# Patient Record
Sex: Male | Born: 1970 | Race: White | Hispanic: No | Marital: Married | Smoking: Never smoker
Health system: Southern US, Community
[De-identification: ages and names within clinical notes are randomized; demographics above are authoritative.]

## PROBLEM LIST (undated history)

## (undated) DIAGNOSIS — E119 Type 2 diabetes mellitus without complications: Secondary | ICD-10-CM

## (undated) DIAGNOSIS — R519 Headache, unspecified: Secondary | ICD-10-CM

## (undated) DIAGNOSIS — N200 Calculus of kidney: Secondary | ICD-10-CM

## (undated) DIAGNOSIS — N189 Chronic kidney disease, unspecified: Secondary | ICD-10-CM

## (undated) DIAGNOSIS — I10 Essential (primary) hypertension: Secondary | ICD-10-CM

## (undated) DIAGNOSIS — I2510 Atherosclerotic heart disease of native coronary artery without angina pectoris: Secondary | ICD-10-CM

## (undated) DIAGNOSIS — E291 Testicular hypofunction: Secondary | ICD-10-CM

## (undated) DIAGNOSIS — M1990 Unspecified osteoarthritis, unspecified site: Secondary | ICD-10-CM

## (undated) DIAGNOSIS — E785 Hyperlipidemia, unspecified: Secondary | ICD-10-CM

## (undated) DIAGNOSIS — K219 Gastro-esophageal reflux disease without esophagitis: Secondary | ICD-10-CM

## (undated) DIAGNOSIS — G8929 Other chronic pain: Secondary | ICD-10-CM

## (undated) HISTORY — DX: Atherosclerotic heart disease of native coronary artery without angina pectoris: I25.10

## (undated) HISTORY — PX: KNEE ARTHROSCOPY: SUR90

## (undated) HISTORY — PX: SPINAL FUSION: SHX223

## (undated) HISTORY — DX: Calculus of kidney: N20.0

## (undated) HISTORY — PX: THYROIDECTOMY, PARTIAL: SHX18

## (undated) HISTORY — PX: SPINAL CORD STIMULATOR BATTERY EXCHANGE: SHX6202

## (undated) HISTORY — PX: PARATHYROIDECTOMY: SHX19

## (undated) HISTORY — PX: SHOULDER SURGERY: SHX246

## (undated) HISTORY — PX: TONSILLECTOMY: SUR1361

---

## 1999-06-14 ENCOUNTER — Ambulatory Visit (HOSPITAL_COMMUNITY): Admission: RE | Admit: 1999-06-14 | Discharge: 1999-06-14 | Payer: Self-pay | Admitting: Orthopedic Surgery

## 1999-06-14 ENCOUNTER — Encounter: Payer: Self-pay | Admitting: Orthopedic Surgery

## 2000-02-29 ENCOUNTER — Ambulatory Visit (HOSPITAL_COMMUNITY): Admission: RE | Admit: 2000-02-29 | Discharge: 2000-02-29 | Payer: Self-pay | Admitting: Gastroenterology

## 2001-10-31 ENCOUNTER — Encounter: Payer: Self-pay | Admitting: Orthopedic Surgery

## 2001-10-31 ENCOUNTER — Ambulatory Visit (HOSPITAL_COMMUNITY): Admission: RE | Admit: 2001-10-31 | Discharge: 2001-10-31 | Payer: Self-pay | Admitting: Orthopedic Surgery

## 2001-12-17 ENCOUNTER — Ambulatory Visit (HOSPITAL_COMMUNITY): Admission: RE | Admit: 2001-12-17 | Discharge: 2001-12-17 | Payer: Self-pay | Admitting: Neurosurgery

## 2001-12-17 ENCOUNTER — Encounter: Payer: Self-pay | Admitting: Neurosurgery

## 2002-02-12 ENCOUNTER — Encounter: Payer: Self-pay | Admitting: Neurosurgery

## 2002-02-12 ENCOUNTER — Ambulatory Visit (HOSPITAL_COMMUNITY): Admission: RE | Admit: 2002-02-12 | Discharge: 2002-02-12 | Payer: Self-pay | Admitting: Neurosurgery

## 2002-03-17 ENCOUNTER — Encounter: Payer: Self-pay | Admitting: Neurosurgery

## 2002-03-17 ENCOUNTER — Encounter: Admission: RE | Admit: 2002-03-17 | Discharge: 2002-03-17 | Payer: Self-pay | Admitting: Neurosurgery

## 2002-03-31 ENCOUNTER — Encounter: Payer: Self-pay | Admitting: Neurosurgery

## 2002-03-31 ENCOUNTER — Encounter: Admission: RE | Admit: 2002-03-31 | Discharge: 2002-03-31 | Payer: Self-pay | Admitting: Neurosurgery

## 2002-04-14 ENCOUNTER — Encounter: Admission: RE | Admit: 2002-04-14 | Discharge: 2002-04-14 | Payer: Self-pay | Admitting: Neurosurgery

## 2002-04-14 ENCOUNTER — Encounter: Payer: Self-pay | Admitting: Neurosurgery

## 2002-07-27 ENCOUNTER — Encounter: Payer: Self-pay | Admitting: Neurosurgery

## 2002-07-27 ENCOUNTER — Inpatient Hospital Stay (HOSPITAL_COMMUNITY): Admission: RE | Admit: 2002-07-27 | Discharge: 2002-07-28 | Payer: Self-pay | Admitting: Neurosurgery

## 2002-08-27 ENCOUNTER — Encounter: Admission: RE | Admit: 2002-08-27 | Discharge: 2002-08-27 | Payer: Self-pay | Admitting: Neurosurgery

## 2002-08-27 ENCOUNTER — Encounter: Payer: Self-pay | Admitting: Neurosurgery

## 2004-07-04 ENCOUNTER — Encounter: Admission: RE | Admit: 2004-07-04 | Discharge: 2004-07-04 | Payer: Self-pay | Admitting: Neurosurgery

## 2004-07-21 ENCOUNTER — Encounter: Admission: RE | Admit: 2004-07-21 | Discharge: 2004-07-21 | Payer: Self-pay | Admitting: Neurosurgery

## 2004-08-01 ENCOUNTER — Encounter: Admission: RE | Admit: 2004-08-01 | Discharge: 2004-08-01 | Payer: Self-pay | Admitting: Neurosurgery

## 2004-11-27 ENCOUNTER — Inpatient Hospital Stay (HOSPITAL_COMMUNITY): Admission: RE | Admit: 2004-11-27 | Discharge: 2004-11-28 | Payer: Self-pay | Admitting: Neurosurgery

## 2004-12-28 ENCOUNTER — Encounter: Admission: RE | Admit: 2004-12-28 | Discharge: 2004-12-28 | Payer: Self-pay | Admitting: Neurosurgery

## 2005-03-09 ENCOUNTER — Encounter: Admission: RE | Admit: 2005-03-09 | Discharge: 2005-03-09 | Payer: Self-pay | Admitting: Neurosurgery

## 2005-03-20 ENCOUNTER — Ambulatory Visit: Payer: Self-pay | Admitting: General Practice

## 2005-07-19 ENCOUNTER — Ambulatory Visit: Payer: Self-pay | Admitting: Orthopedic Surgery

## 2006-06-25 ENCOUNTER — Ambulatory Visit: Payer: Self-pay | Admitting: Pain Medicine

## 2006-07-03 ENCOUNTER — Ambulatory Visit: Payer: Self-pay | Admitting: Pain Medicine

## 2006-08-14 ENCOUNTER — Ambulatory Visit: Payer: Self-pay | Admitting: Pain Medicine

## 2006-09-04 ENCOUNTER — Ambulatory Visit: Payer: Self-pay | Admitting: Pain Medicine

## 2006-09-17 ENCOUNTER — Ambulatory Visit: Payer: Self-pay | Admitting: Pain Medicine

## 2006-09-23 ENCOUNTER — Ambulatory Visit: Payer: Self-pay | Admitting: Pain Medicine

## 2006-10-22 HISTORY — PX: SPINAL CORD STIMULATOR IMPLANT: SHX2422

## 2006-10-23 ENCOUNTER — Ambulatory Visit: Payer: Self-pay | Admitting: Physician Assistant

## 2006-10-24 ENCOUNTER — Ambulatory Visit: Payer: Self-pay | Admitting: Pain Medicine

## 2006-11-08 ENCOUNTER — Ambulatory Visit: Payer: Self-pay | Admitting: Physician Assistant

## 2006-11-12 ENCOUNTER — Ambulatory Visit: Payer: Self-pay | Admitting: Pain Medicine

## 2006-11-21 ENCOUNTER — Ambulatory Visit: Payer: Self-pay | Admitting: Physician Assistant

## 2006-12-12 ENCOUNTER — Ambulatory Visit: Payer: Self-pay | Admitting: Pain Medicine

## 2006-12-30 ENCOUNTER — Ambulatory Visit: Payer: Self-pay | Admitting: Physician Assistant

## 2007-01-09 ENCOUNTER — Ambulatory Visit: Payer: Self-pay | Admitting: Pain Medicine

## 2007-01-29 ENCOUNTER — Ambulatory Visit: Payer: Self-pay | Admitting: Physician Assistant

## 2007-02-27 ENCOUNTER — Ambulatory Visit: Payer: Self-pay | Admitting: Physician Assistant

## 2007-03-26 ENCOUNTER — Ambulatory Visit: Payer: Self-pay | Admitting: Physician Assistant

## 2007-04-29 ENCOUNTER — Ambulatory Visit: Payer: Self-pay | Admitting: Physician Assistant

## 2007-05-01 ENCOUNTER — Ambulatory Visit (HOSPITAL_COMMUNITY): Admission: RE | Admit: 2007-05-01 | Discharge: 2007-05-01 | Payer: Self-pay | Admitting: Neurology

## 2007-05-13 ENCOUNTER — Ambulatory Visit: Payer: Self-pay | Admitting: Physician Assistant

## 2007-05-28 ENCOUNTER — Ambulatory Visit: Payer: Self-pay | Admitting: Physician Assistant

## 2007-06-30 ENCOUNTER — Ambulatory Visit: Payer: Self-pay | Admitting: Physician Assistant

## 2007-07-08 ENCOUNTER — Ambulatory Visit: Payer: Self-pay | Admitting: Pain Medicine

## 2007-08-07 ENCOUNTER — Ambulatory Visit: Payer: Self-pay | Admitting: Physician Assistant

## 2007-08-12 ENCOUNTER — Ambulatory Visit: Payer: Self-pay | Admitting: Pain Medicine

## 2007-09-03 ENCOUNTER — Ambulatory Visit: Payer: Self-pay | Admitting: Physician Assistant

## 2007-09-22 ENCOUNTER — Ambulatory Visit: Payer: Self-pay | Admitting: Pain Medicine

## 2007-09-23 ENCOUNTER — Ambulatory Visit: Payer: Self-pay | Admitting: Pain Medicine

## 2007-10-07 ENCOUNTER — Ambulatory Visit: Payer: Self-pay | Admitting: Physician Assistant

## 2007-11-06 ENCOUNTER — Ambulatory Visit: Payer: Self-pay | Admitting: Pain Medicine

## 2007-12-10 ENCOUNTER — Ambulatory Visit: Payer: Self-pay | Admitting: Physician Assistant

## 2007-12-31 ENCOUNTER — Ambulatory Visit: Payer: Self-pay | Admitting: Physician Assistant

## 2008-01-08 ENCOUNTER — Encounter: Payer: Self-pay | Admitting: Pain Medicine

## 2008-01-23 ENCOUNTER — Encounter: Payer: Self-pay | Admitting: Pain Medicine

## 2008-01-27 ENCOUNTER — Ambulatory Visit: Payer: Self-pay | Admitting: Physician Assistant

## 2008-03-23 ENCOUNTER — Ambulatory Visit: Payer: Self-pay | Admitting: Physician Assistant

## 2008-06-22 ENCOUNTER — Ambulatory Visit: Payer: Self-pay | Admitting: Physician Assistant

## 2008-07-06 ENCOUNTER — Ambulatory Visit: Payer: Self-pay | Admitting: Physician Assistant

## 2008-09-06 ENCOUNTER — Ambulatory Visit: Payer: Self-pay | Admitting: Physician Assistant

## 2008-09-28 ENCOUNTER — Ambulatory Visit: Payer: Self-pay | Admitting: Physician Assistant

## 2008-11-02 ENCOUNTER — Ambulatory Visit: Payer: Self-pay | Admitting: Physician Assistant

## 2008-12-28 ENCOUNTER — Ambulatory Visit: Payer: Self-pay | Admitting: Physician Assistant

## 2009-01-11 ENCOUNTER — Ambulatory Visit: Payer: Self-pay | Admitting: Pain Medicine

## 2009-02-01 ENCOUNTER — Ambulatory Visit: Payer: Self-pay | Admitting: Physician Assistant

## 2009-02-08 ENCOUNTER — Ambulatory Visit: Payer: Self-pay | Admitting: Pain Medicine

## 2009-02-24 ENCOUNTER — Ambulatory Visit: Payer: Self-pay | Admitting: Physician Assistant

## 2009-03-24 ENCOUNTER — Ambulatory Visit: Payer: Self-pay | Admitting: Physician Assistant

## 2009-06-30 ENCOUNTER — Ambulatory Visit: Payer: Self-pay | Admitting: Physician Assistant

## 2009-09-07 ENCOUNTER — Encounter: Admission: RE | Admit: 2009-09-07 | Discharge: 2009-09-07 | Payer: Self-pay | Admitting: Neurosurgery

## 2009-09-16 ENCOUNTER — Encounter: Admission: RE | Admit: 2009-09-16 | Discharge: 2009-09-16 | Payer: Self-pay | Admitting: Neurosurgery

## 2009-09-27 ENCOUNTER — Ambulatory Visit: Payer: Self-pay | Admitting: Physician Assistant

## 2009-12-19 ENCOUNTER — Ambulatory Visit: Payer: Self-pay | Admitting: Pain Medicine

## 2010-03-30 ENCOUNTER — Ambulatory Visit: Payer: Self-pay | Admitting: Pain Medicine

## 2011-03-09 NOTE — Op Note (Signed)
[name] [name]              ACCOUNT NO.:  [id number]   MEDICAL RECORD NO.:  [id number]          PATIENT TYPE:  INP   LOCATION:  2899                         FACILITY:  [facility]   PHYSICIAN:  Kathaleen Maser. Pool, M.D.    DATE OF BIRTH:  07/08/1971   DATE OF PROCEDURE:  11/27/2004  DATE OF DISCHARGE:                                 OPERATIVE REPORT   PREOPERATIVE DIAGNOSIS:  L5-S1 degenerative disk disease/failed back  syndrome, with intractable back pain and radiculopathy.  Status posts L4-5  fusion with instrumentation.   POSTOPERATIVE DIAGNOSIS:  L5-S1 degenerative disk disease/failed back  syndrome, with intractable back pain and radiculopathy.  Status posts L4-5  fusion with instrumentation.   PROCEDURE:  1.  Re-exploration of L4-5 posterior lumbar fusion with removal of hardware.  2.  L5-S1 re-exploration of laminectomy with bilateral redo      microdiskectomies.  3.  L5-S1 posterior lumbar body fusion utilizing tangent wedges and local      autografting.  4.  L5-S1 posterior lateral arthrodesis utilizing nonsegmental pedicle screw      fixation and local autografting.   ASSISTANT:  Donalee Citrin, M.D.   ANESTHESIA:  General orotracheal.   INDICATIONS:  [name] [name] is a 40 year old male who is status post  previous L4-5 and L5-S1 laminectomies and diskectomies.  The patient  subsequently underwent an L4-5 decompression and fusion with reasonably good  results.  The patient has been bothered with progressively worsening back  and left lower extremity pain, failed all conservative management, however.  Workup has demonstrated evidence of progressive breakdown of the L5-S1 disk  space.  The patient has marked reproduction of his symptoms with prerogative  diskotomy at the L5-S1 level.  The remainder of his lumbar spine looks well  healed.  The previous lumbar fusion appears solid at L4-5.  We have  discussed the options of management, including possibility of re-exploring  his  fusion and then performing an L5-S1 decompression and fusion in hopes of  improving his symptoms,  The patient is aware of the risks and benefits and  wishes to proceed.   OPERATIVE NOTE:  The patient was placed on the operating room table in the  supine position.  After adequate level of anesthesia was achieved, the  patient was positioned prone onto a Wilson frame and appropriately padded.  The patient's lumbar region was prepped and draped sterilely.  A 10 blade  was used to make a  linear skin incision overlying the L3-4-5 and S1 levels.  This was carried down sharply in the midline.  Subperiosteal dissection was  then performed, exposing the lamina and facet joints of L3-4, the pedicle  screw fixation of L4-5 and the transverse process of L5, the sacral alae,  and the L5-S1 facet joint complex, as well as the lamina of the sacrum.  The  L5 lamina was also dissected free.  The patient's previous laminotomy site  on the right at L5-S1 was dissected free.  Complete laminectomy at L5 was  then performed, using Leksell rongeurs, Kerrison rongeurs and high-speed  drill to remove the entire lamina at  L5.  Complete inferior facetectomies at  L5 were then performed bilaterally.  Complete superior facetectomies at S1  were performed bilaterally.  Epidural scar, ligamentum flavum was then  elevated, resected in usual fashion, using Kerrison rongeurs.  Underlying  thecal sac and exiting L5 and S1 nerve roots were identified.  Wide  decompressive foraminotomies were then performed along the course of the  exiting L5 and S1 nerve roots.  Epidural venous plexus was coagulated and  cut.  Epidural scar was dissected free on the patient's right side.  Starting first with the patient's right side, thecal sac and nerve roots  were protected and retracted towards the midline.  Disk space at L5-S1 was  then identified, incised with a 15 blade in a rectangular fashion, which  assured wide disk space.   Clean-out was then achieved using pituitary  rongeurs, upward-angled pituitary rongeurs, and Epstein curettes.  The  procedure was then repeated on the contralateral side, again without  complication.  Disk space was then sequentially dilated up to 8 mm, then an  8-mm distractor left on patient's right side.  Thecal sac and nerve roots  were protected on the left side.  Disk space was then reamed with 8-mm  tangent box cutter and then cut with an 8-mm tangent incision.  Soft tissue  was removed from the interspace.  An 8 x 26-mm tangent wedge was the  impacted into place and recessed approximately 2 mm from the posterior  cortical margin.  The distractor was removed from the patient's right side.  Thecal sac and nerve roots were protected on the right side.  Disk space was  once again reamed and then cut with 8-mm tangent instruments.  The disk  space was further curettaged.  Morcellized autograft was then packed in the  interspace.  A second 8 x 26-mm tangent wedge was then impacted into place.  Pedicle screws at L5 were left in place.  The pedicle screws at L4 were  disconnected, as were the pedicle screws at L5.  The rod was removed.  The  pedicle screws at L4 were removed.  The fusion at L4-5 was explored and  found to be rock solid.  The pedicle screws at L5 were left in position to  use in the L5-S1 arthrodesis.  Pedicles at S1 were then identified  bilaterally.  Pilot holes were then drilled.  This was done under  fluoroscopic guidance.  Each pedicle was then probed using pedicle awl.  Each pedicle awl tract was then probed and found to be solidly within bone.  Each awl tract was then tapped with 5.25-mm screw tap.  Screw tap hole was  probed and found to be solid within bone.  A 6.5 x 35-mm spiral 90-degree  screws were placed bilaterally at S1.  Transverse process and sacral alae were then decorticated, using a high-speed drill.  Morcellized autograft was  packed posterolaterally.   A short segment of titanium rod was then contoured  and placed through the screw heads at L5 and S1.  A locking cap was then  placed over the screw heads.  Locking caps were then engaged with construct  under compression.  Final images revealed good position of the bone graft  and hardware, proper operative level with normal alignment of spine.  Gelfoam was placed topically over the thecal sac for hemostasis.  A medium  Hemovac drain was left in the epidural space.  The wound was closed in  layers with Vicryl sutures.  Steri-Strips and sterile dressing were applied.  There were no intraoperative complications.  The patient tolerated the  procedure.  He was taken to the recovery room postoperatively.      HAP/MEDQ  D:  11/27/2004  T:  11/27/2004  Job:  [id number]

## 2011-03-09 NOTE — Op Note (Signed)
NAME:  [name], [name]                        ACCOUNT NO.:  [id number]   MEDICAL RECORD NO.:  [id number]                   PATIENT TYPE:  INP   LOCATION:  3021                                 FACILITY:  [facility]   PHYSICIAN:  Kathaleen Maser. Pool, M.D.                 DATE OF BIRTH:  [birth year]   DATE OF PROCEDURE:  07/27/2002  DATE OF DISCHARGE:                                 OPERATIVE REPORT   PREOPERATIVE DIAGNOSES:  L4-5 grade I spondylolisthesis, left L4-5 recurrent  herniated nucleus pulposis, and failed back syndrome.   POSTOPERATIVE DIAGNOSES:  L4-5 grade I spondylolisthesis, left L4-5  recurrent herniated nucleus pulposis, and failed back syndrome.   PROCEDURES:  L4-5 decompressive laminectomy with foraminotomies, redo;  bilateral L4-5 redo microdiskectomies.  L4-5 posterior lumbar interbody  fusion utilizing Tangent wedges and local autograft.  L4-5 posterolateral  fusion utilizing pedicle screws in addition to local autograft.   SURGEON:  Kathaleen Maser. Pool, M.D.   ASSISTANT:  Donzetta Sprung. Wynetta Emery, M.D.   ANESTHESIA:  General endotracheal.   INDICATIONS:  The patient is a 40 year old male, who is status post previous  left-sided L4-5 and right-sided L5-S1 laminotomy and microdiskectomy.  Postoperatively, the patient has had difficulty with persistent back and  left lower extremity pain failing all conservative  management.  MRI  scanning demonstrated evidence of a small recurrent disk herniation out to  the left side at L4-5.  The patient also has evidence of disk space collapse  and an early grade I spondylolisthesis at L4-5.  The patient has failed  conservative  management.  He has decided to proceed with an L4-5  decompression and fusion procedure with instrumentation in hopes of  improving his symptoms.   OPERATIVE NOTE:  The patient was taken to the operating room and placed on  the table in a supine position.  After an adequate level of anesthesia was  achieved, the patient  was positioned prone on to a Wilson frame and  appropriately padded.  The patient's lumbar region was prepped and draped  with sterile drapes.  A 10-blade was used to make a linear skin incision  overlying the L3, L4, and L5 levels.  This was carried down sharply in the  midline.  Subperiosteal dissection was then performed exposing the lamina  and facet joints of L3, L4, and L5 as well as the transverse processes of L4  and L5.  Deep self-retaining retractor was placed.  Intraoperative x-ray was  taken and the level was confirmed.  A decompressive laminectomy was then  performed at L4-5 using Kerrison rongeurs, Leksell rongeurs, and the high-  speed drill.  While dissecting, it was noted that the lamina and inferior  facet complex at L4 was completely loose bilaterally.  Dissecting this free  further demonstrated completely spondylosis bilaterally at L4.  The lamina  and inferior facets were resected.  All bone was cleaned and used  in later  autografting.  Partial superior facetectomy was then performed using  Kerrison rongeurs of L5 and the superior aspect of the L5 lamina was also  removed.  Epidural scar from previous laminotomy was dissected free using  __________.  The underlying thecal sac and exiting L5 and L5 nerve roots  were identified.  A wide foraminotomy was performed along the course of the  exiting nerve roots.  Epidural venous plexus was coagulated and cut.  Epidural scar was further lysed on the left-sided L4-5 and the thecal sac at  L5 nerve root were mobilized and retracted towards the midline.  With  microscope for microdissection, the disk space was identified and disk  herniation was identified.  These were incised with a 15-blade in a standard  fashion.  A wide disk space clean out was then achieved using pituitary  rongeurs, upward angled pituitary rongeurs and Epstein curets.  The  procedure was then repeated on the contralateral side.  The disk space was  then  distracted up to 10 mm with the distractor left in the patient's left  side.  Attention was placed to the right side.  The disk space was then cut  with a box cutter and then cut with a 10 mm Tangent chisel.  All soft tissue  was removed from the interspace.  A 10 x 26 mm Tangent wedge was then  impacted into place, recessed approximately 2 mm within the posterior  cortical margin.  The retractor system was removed.  The distractor was  removed from the other side, thecal sac and nerve roots were protected on  the left.  Disk space was then reamed and then cut with a 10 mm Tangent  chisel.  Soft tissue was removed.  The disk space was further curettage and  morcellized autograft was packed in the interspace.  A second 10 x 26 mm  Tangent wedge was then impacted into place and recessed approximately 1 mm  from the posterior cortical margin.  Intraoperative x-rays revealed good  position of the bone grafts at the proper level.  The pedicles at L4 and L5  were then isolated using surface landmarks and intraoperative fluoroscopy.  ___________ bone was removed overlying the pedicle using the high-speed  drill.  Each pedicle was then probed using a pedicle awl.  Each pedicle awl  tract was found to be solid within bone.  Each pedicle awl tract was then  tapped with a 5.2 mm screw tap.  Each screw tap hole was found to be solid  within bone.  At L4, a 6.75 x 45 mm spiral 90 screws were placed  bilaterally; at L5 6.75 x 40 mm spiral 90 screws were placed bilaterally.  The transverse processes of L4 and L5 were then decorticated using the high-  speed drill.  Morcellized autograft was packed posterolaterally for fusion.  A short segment of titanium rod was then contoured and placed over the screw  heads bilaterally.  Locking caps were then placed over the screw heads.  The  locking caps were then engaged with the construct under compression.  Final images revealed good position of bone grafts and  hardware at the proper  operative level with normal alignment of the spine.  Blunt probe passed  easily along the course of the exiting nerve roots.  There was no evidence  of injury to thecal sac or nerve roots.  There was no evidence of any  residual compression of the thecal sac or nerve roots.  Final images  revealed good position of the bone grafts and hardware at the proper  operative level and normal alignment of the spine in both A/P and lateral  planes.  The wound was irrigated with antibiotic solution.  Gelfoam was  placed topically for hemostasis, which was found to be good.  A medium  Hemovac drain was left in the epidural space.  The wound was then closed in  layers with Vicryl sutures.  Steri-Strips and sterile dressings were  applied.  There were no apparent complications.  The patient tolerated the  procedure well and he returned to the recovery room postoperatively.                                               Henry A. Pool, M.D.    HAP/MEDQ  D:  07/27/2002  T:  07/27/2002  Job:  [id number]

## 2011-03-09 NOTE — Op Note (Signed)
[place]  Patient:    [name] [name], [name] [name] Visit Number: [id number] MRN: [id number]          Service Type: DSU Location: 3000 [id number] Attending Physician:  Donn Pierini Dictated by:   Julio Sicks, M.D. Proc. Date: 12/17/01 Admit Date:  12/17/2001                             Operative Report  PREOPERATIVE DIAGNOSIS:  Left L4-5 herniated nucleus pulposus with radiculopathy and right L5-S1 herniated nucleus pulposus with radiculopathy.  POSTOPERATIVE DIAGNOSIS:  Left L4-5 herniated nucleus pulposus with radiculopathy and right L5-S1 herniated nucleus pulposus with radiculopathy.  OPERATION PERFORMED:  Left L4-5 laminotomy with microdiskectomy.  Right L4-5 laminotomy with microdiskectomy.  SURGEON:  Julio Sicks, M.D.  ASSISTANT:  Donalee Citrin, Montez Hageman.  ANESTHESIA:  General endotracheal.  INDICATIONS FOR PROCEDURE:  The patient is a 40 year old male with history of bilateral lower extremity pain and some degree of back pain failing all conservative management.  MRI scanning demonstrates evidence of a left-sided paracentral disk herniation at L4-5 with compression of the thecal sac and left-sided L5 nerve root.  There is also evidence of a right-sided L5-S1 disk herniation with compression of the right-sided S1 nerve root.  We discussed the options available for management.  The patient appears to be symptomatic from both problems.  We decided to proceed with a left-sided L4-5 and a right-sided L5-S1 laminotomy and microdiskectomy for hopeful relief of his symptoms.  DESCRIPTION OF PROCEDURE:  The patient was taken to the operating room and placed on the operating table in supine position.  After an adequate level of anesthesia was achieved, the patient was positioned prone onto a Wilson frame and appropriately padded.  The patients lumbar region was shaved and prepped sterilely.  A 10 blade was used to make a linear skin incision overlying  the L4-5 and S1 levels.  This was carried down sharply in the midline. Subperiosteal dissection was performed exposing the lamina and facet joints at L4-5 on the left side and L5-S1 on the right side.  The self-retaining retractor was placed at L4-5.  X-ray was taken and the level was confirmed.  A laminotomy was then performed using a high speed drill and Kerrison rongeurs on the left side at L4-5 to remove the inferior one third of the lamina at L4, the medial edge of the L4-5 facet joint and the superior rim of the L5 lamina. The ligamentum flavum was then elevated and resected in piecemeal fashion using Kerrison rongeurs for the underlying thecal sac and exiting left-sided L5 nerve root were identified.  Gelfoam was placed over the laminotomy defect and attention was placed to the right side at L5-S1.  Once again the high speed drill and Kerrison rongeurs were used to remove the inferior one third of the lamina of L5, the medial edge of the L5-S1 facet joint and the superior rim of the S1 lamina.  The ligamentum flavum was elevated and resected in piecemeal fashion using Kerrison rongeurs.  The underlying thecal sac was then identified as was the exiting right-sided S1 nerve root.  The epidural venous plexus was coagulated and cut.  Microscope was brought into the field and used for microdissection of the nerve roots and underlying disk herniation. Starting first on the right side at L5-S1, the thecal sac and nerve roots were mobilized and retracted toward the midline.  Disk space was isolated  and incised with a 15 blade in rectangular fashion.  A wide disk space clean-out was then achieved using pituitary rongeurs, upward angled pituitary rongeurs and Epstein curets.  Overhanging osteophytes were removed using an osteophyte remover.  All loose or obviously degenerative disk material was removed from the interspace.  All elements of disk herniation were completely resected.  At this  point there was no evidence of compression of the thecal sac or nerve root of S1 on the right side.  Attention was then placed to the left side at L4-5.  Once again the thecal sac and nerve roots were protected.  Disk herniation was readily apparent.  A free fragment was encountered and resected completely.  The disk space was then incised with a 15 blade in rectangular fashion.  A wide disk space clean out was then achieved using pituitary rongeurs, upward angled pituitary rongeurs and Epstein curets.  All loose or obviously degenerated disk material was removed from the interspace.  After a very thorough diskectomy had been performed at both levels.  The wound was then irrigated with antibiotic solution.  Gelfoam was placed topically over each laminotomy defect where hemostasis was found to be good.  Microscope and retractor were removed.  Hemostasis in the muscle achieved with electrocautery.  The wound was then closed in layers with Vicryl sutures. Steri-Strips and sterile dressing were applied.  There were no apparent complications.   The patient tolerated the procedure well and he returned to the recovery room postoperatively. y Dictated by:   Julio Sicks, M.D. Attending Physician:  Donn Pierini DD:  12/17/01 TD:  12/17/01 Job: [id number]

## 2011-03-09 NOTE — Op Note (Signed)
NAME:  [name] [name], [name] [name]                        ACCOUNT NO.:  [id number]   MEDICAL RECORD NO.:  [id number]                   PATIENT TYPE:  INP   LOCATION:  3021                                 FACILITY:  [facility]   PHYSICIAN:  Kathaleen Maser. Pool, M.D.                 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   PROCEDURES:  1. L4-5 decompressive laminectomy with foraminotomies.  2. L4-5 posterior lumbar interbody fusion utilizing Tangent wedges and local     autograft.  3. L4-5 posterolateral fusion utilizing pedicle screw fixation and local     autograft.   SURGEON:  Kathaleen Maser. Pool, M.D.   ASSISTANT:  Donalee Citrin, M.D.   ANESTHESIA:  General endotracheal.   INDICATIONS:  The patient is a 40 year old male who is status post previous  L4-5 and L5-S1 laminotomy and diskectomies.  Postoperatively the patient has  had a great deal of back pain, which has progressively worsened.  He has  recurrent left lower extremity radicular symptoms consistent with both left-  sided L4 and L5 radiculopathies.  The patient has failed all efforts at  conservative management.  Workup has demonstrated evidence of a small  recurrent disk herniation off to the left at L4-5 and what appears to be an  early grade 1 spondylolisthesis.  We discussed options available for  management, including the possibility of undergoing an L4-5 decompression  and fusion surgery in hopes of alleviating his symptoms.   DESCRIPTION OF PROCEDURE:  The patient was taken to the operating room and  placed on the operating table in the supine position.  After an adequate  level of anesthesia was achieved, the patient was positioned prone onto a  Wilson frame, appropriately padded.  The patient's lumbar region was prepped  and draped sterilely.  A 10 blade was used to make a linear skin incision  overlying the L4-5 interspace.  This was carried down sharply in the  midline.  Subperiosteal dissection was performed, exposing the laminae and  facet joints at L3, L4, and L5.  The dissection was taken out further  laterally, and the transverse processes at L4 and L5 were dissected free.  A  deep self-retaining retractor was placed.  Intraoperative x-ray was taken  with the fluoroscopy unit, and the level was confirmed.  It should be noted  at this point that it was readily apparent the patient had suffered from  traumatic spondylolysis of the L4 pars.  This was not present at his  previous surgery.  A complete laminectomy at L4 was then performed using  Leksell  rongeurs, Kerrison rongeurs, and the high-speed drill.  This was a  re-exploration of his previous laminotomy site.  The ligamentum flavum and  epidural scar were elevated and resected in piecemeal fashion using Kerrison  rongeur.  The underlying thecal sac and exiting L4 and L5 nerve roots were  identified and widely decompressed.  Epidural scar was further resected.  Nerve roots and thecal sac on the left side were mobilized and retracted  toward the midline.  The disk herniation was readily apparent.  This was  incised with a 15 blade in rectangular fashion.  A wide disk space clean-out  was then achieved using pituitary rongeurs, upward-angled pituitary  rongeurs, and Epstein curettes.  The procedure was then repeated on the  contralateral side.  The disk space was then distracted up to 10 mm.  With  10 mm distractor left in the patient's right side, thecal sac and nerve  roots were protected on the left.  The disk space was then reamed and then  cut with a 10 mm chisel on the left.  Soft tissue was  removed from the  interspace.  A 10 x 26 mm Tangent wedge was then impacted into place and  recessed approximately 1 mm from the posterior cortical margin.  Retractor  and the distractor was removed.  Thecal sac and nerve roots were retracted  on the right.  Disk space was then once again cut and reamed and then later  cut with a 10 mm chisel.  Soft tissue was removed.  The disk space was  further curetted.  Morcellized autograft was saved from the previous  laminectomy and facetectomy and was then packed in the interspace.  A second  10 x 26 wedge was then impacted into placed and recessed approximately 1 mm  from the posterior cortical margin.  The pedicles at L4 and L5 were then  isolated using ______ intraoperative fluoroscopy.  Isolating the pedicles,  each pedicle was then entered by first drilling a pilot hole under  fluoroscopic guidance, tapping the pilot hole using a pedicle awl, testing  the pedicle awl hole using a blunt probe, tapping the pedicle awl hole using  5.25 mm screw tap, and then subsequently placing the screw.  Spiral 90 6.75  mm screws were placed bilaterally at L4 and L5.  All screws were found to be  well-positioned.  Transverse processes were decorticated using the high-  speed drill.  Morcellized autograft was packed posterolaterally.  A short  segment of titanium rod was placed over the screw heads at L4 and L5.  Locking caps were then engaged.  Caps were then placed under compression and  given a final tightening.  Final images revealed good position of bone  grafts and hardware, proper operative level, with normal alignment of the  spine.  The wound was irrigated one final time.  Gelfoam was placed  topically for hemostasis and found to be good.  A medium Hemovac drain was  left in the epidural space.  The wound was then closed in layers with Vicryl  sutures.  Steri-Strips and a sterile dressing were applied.  There were no apparent complications.  The  patient tolerated the procedure well, and he  returns to the recovery room postop.  Henry A. Pool, M.D.    HAP/MEDQ  D:  08/13/2002  T:  08/14/2002  Job:  [id number]

## 2011-03-09 NOTE — Procedures (Signed)
[place]  Patient:    [name] [name], [name] [name]                     MRN: [id number] Proc. Date: 02/29/00 Adm. Date:  [id number] Disc. Date: [id number] Attending:  Deneen Harts CC:         [facility], [place]                           Procedure Report  PROCEDURE:  Panendoscopy.  INDICATION:  A 40 year old white male with epigastric, substernal, and interscapular pain.  No correlation with food.  Patient does note choking sensation.  He had been taking doxycycline for six weeks, and this was felt to possibly be contributing to his symptoms.  He has now been off doxycycline for the past several weeks without improvement.  Also, a trial of Aciphex 20 mg daily without improvement over his usual Prevacid 30 mg per day.  Also complaining of left lower quadrant pain, intermittent, cramping in nature, associated with increased stool frequency.  Undergoing endoscopy to further evaluate persistent abdominal pain.  DESCRIPTION OF PROCEDURE:  After reviewing the nature of the procedure with the patient including potential risks and complications, and after discussing alternative methods of diagnosis and treatment, informed consent was signed.  The patient was premedicated, receiving IV sedation totalling Versed 7 mg, fentanyl 75 mcg, administered in divided doses prior to and during the course of the procedure.  Using an Olympus video endoscope, proximal esophagus intubated under direct vision.  Normal oral findings without lesion of the epiglottis, vocal cords, or piriform sinus.  Proximal, mid-, and distal segments of the esophagus normal.  Mucosal Z-line distinct at 42 cm.  No significant hiatal hernia identified.  No evidence of reflux disease.  Gastric fundus, body, and antrum normal.  Pylorus symmetric.  Duodenal bulb and second portion normal.  Retroflex view of the angularis, lesser curve, gastric cardia and fundus negative.  Stomach  decompressed, scope withdrawn.  The patient tolerated the procedure without difficulty, being maintained on Datascope monitor, local oxygen throughout.  Returned to recovery in stable condition.  ASSESSMENT: 1. Abdominal pain, probably functional. 2. Probable irritable bowel syndrome.  Discussed with mother, who feels that    symptoms may all be stress-related.  RECOMMENDATIONS: 1. Trial of Nexium 40 mg daily for 30 days. 2. Antireflux measures. 3. Trial of NuLev one to two SL q.4h. p.r.n. left lower quadrant abdominal    pain. 4. ROV p.r.n. DD:  02/29/00 TD:  03/04/00 Job: [id number]

## 2011-09-27 DIAGNOSIS — R7989 Other specified abnormal findings of blood chemistry: Secondary | ICD-10-CM | POA: Insufficient documentation

## 2011-09-27 DIAGNOSIS — E119 Type 2 diabetes mellitus without complications: Secondary | ICD-10-CM | POA: Insufficient documentation

## 2011-12-22 ENCOUNTER — Ambulatory Visit: Payer: Self-pay | Admitting: Internal Medicine

## 2012-10-30 ENCOUNTER — Ambulatory Visit: Payer: Self-pay | Admitting: Otolaryngology

## 2013-05-25 ENCOUNTER — Ambulatory Visit: Payer: Self-pay | Admitting: Physician Assistant

## 2013-05-25 LAB — URINALYSIS, COMPLETE
Bacteria: NEGATIVE
Bilirubin,UR: NEGATIVE
Blood: NEGATIVE
Glucose,UR: 100 mg/dL (ref 0–75)
Ketone: NEGATIVE
Leukocyte Esterase: NEGATIVE
Nitrite: NEGATIVE
Ph: 6.5 (ref 4.5–8.0)
Protein: NEGATIVE
Specific Gravity: 1.01 (ref 1.003–1.030)
Squamous Epithelial: NONE SEEN

## 2014-03-11 DIAGNOSIS — F329 Major depressive disorder, single episode, unspecified: Secondary | ICD-10-CM | POA: Insufficient documentation

## 2014-03-11 DIAGNOSIS — F32A Depression, unspecified: Secondary | ICD-10-CM | POA: Insufficient documentation

## 2014-03-11 DIAGNOSIS — B001 Herpesviral vesicular dermatitis: Secondary | ICD-10-CM | POA: Insufficient documentation

## 2014-03-15 ENCOUNTER — Emergency Department: Payer: Self-pay | Admitting: Emergency Medicine

## 2014-03-15 LAB — D-DIMER(ARMC): D-Dimer: 174 ng/ml

## 2014-03-15 LAB — CBC
HCT: 49.2 % (ref 40.0–52.0)
HGB: 16.6 g/dL (ref 13.0–18.0)
MCH: 32.7 pg (ref 26.0–34.0)
MCHC: 33.7 g/dL (ref 32.0–36.0)
MCV: 97 fL (ref 80–100)
Platelet: 219 10*3/uL (ref 150–440)
RBC: 5.06 10*6/uL (ref 4.40–5.90)
RDW: 13.1 % (ref 11.5–14.5)
WBC: 8.3 10*3/uL (ref 3.8–10.6)

## 2014-03-15 LAB — COMPREHENSIVE METABOLIC PANEL
Albumin: 3.7 g/dL (ref 3.4–5.0)
Alkaline Phosphatase: 62 U/L
Anion Gap: 7 (ref 7–16)
BUN: 10 mg/dL (ref 7–18)
Bilirubin,Total: 0.3 mg/dL (ref 0.2–1.0)
Calcium, Total: 8.8 mg/dL (ref 8.5–10.1)
Chloride: 102 mmol/L (ref 98–107)
Co2: 30 mmol/L (ref 21–32)
Creatinine: 1.17 mg/dL (ref 0.60–1.30)
EGFR (African American): >60
EGFR (Non-African Amer.): >60
Glucose: 78 mg/dL (ref 65–99)
Osmolality: 275 (ref 275–301)
Potassium: 3.8 mmol/L (ref 3.5–5.1)
SGOT(AST): 16 U/L (ref 15–37)
SGPT (ALT): 31 U/L (ref 12–78)
Sodium: 139 mmol/L (ref 136–145)
Total Protein: 7.1 g/dL (ref 6.4–8.2)

## 2014-03-15 LAB — PRO B NATRIURETIC PEPTIDE: B-Type Natriuretic Peptide: 15 pg/mL (ref 0–125)

## 2014-03-15 LAB — TROPONIN I: Troponin-I: <0.02 ng/mL

## 2014-04-15 ENCOUNTER — Other Ambulatory Visit (HOSPITAL_COMMUNITY): Payer: Self-pay | Admitting: Orthopedic Surgery

## 2014-04-15 DIAGNOSIS — T8489XA Other specified complication of internal orthopedic prosthetic devices, implants and grafts, initial encounter: Secondary | ICD-10-CM

## 2014-04-19 ENCOUNTER — Encounter (HOSPITAL_COMMUNITY): Payer: Self-pay

## 2014-04-19 ENCOUNTER — Other Ambulatory Visit (HOSPITAL_COMMUNITY): Payer: Self-pay

## 2014-04-27 ENCOUNTER — Encounter (HOSPITAL_COMMUNITY): Payer: Self-pay

## 2014-04-27 ENCOUNTER — Ambulatory Visit (HOSPITAL_COMMUNITY): Payer: Self-pay

## 2014-05-04 ENCOUNTER — Encounter (HOSPITAL_COMMUNITY)
Admission: RE | Admit: 2014-05-04 | Discharge: 2014-05-04 | Disposition: A | Discharge status: Home / Self Care | Payer: Medicare Other | Source: Ambulatory Visit | Attending: Orthopedic Surgery | Admitting: Orthopedic Surgery

## 2014-05-04 DIAGNOSIS — M25469 Effusion, unspecified knee: Secondary | ICD-10-CM | POA: Insufficient documentation

## 2014-05-04 DIAGNOSIS — T8489XA Other specified complication of internal orthopedic prosthetic devices, implants and grafts, initial encounter: Secondary | ICD-10-CM

## 2014-05-04 DIAGNOSIS — M25569 Pain in unspecified knee: Secondary | ICD-10-CM | POA: Insufficient documentation

## 2014-05-04 DIAGNOSIS — Z96659 Presence of unspecified artificial knee joint: Secondary | ICD-10-CM | POA: Diagnosis not present

## 2014-05-04 MED ORDER — TECHNETIUM TC 99M MEDRONATE IV KIT
25.0000 | PACK | Freq: Once | INTRAVENOUS | Status: AC | PRN
  Administered 2014-05-04: 25 via INTRAVENOUS

## 2014-05-07 DIAGNOSIS — G4700 Insomnia, unspecified: Secondary | ICD-10-CM | POA: Insufficient documentation

## 2015-02-11 NOTE — Op Note (Signed)
PATIENT NAME:  [name] Brown, [name] Brown MR#:  [id number] DATE OF BIRTH:  [birth year]  DATE OF PROCEDURE:  10/30/2012  PREOPERATIVE DIAGNOSIS:  1. 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  2. 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  POSTOPERATIVE DIAGNOSIS:  1. 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  2. 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   PROCEDURE:  1. Internal nasal valve stenosis repair.  2. Harvest of conchal cartilage from the right ear.  3. Revision septoplasty.  4. Bilateral inferior turbinate out-fracture.   SURGEON: Janalee Dane, MD   ANESTHESIA:  General endotracheal.  DESCRIPTION OF PROCEDURE:  The patient was placed in the supine position on the operating room table.  After general endotracheal anesthesia had been induced, the patient was turned 90 degrees counterclockwise from anesthesia and placed in a beach chair position.  The right ear was locally anesthetized with 0.5% Lidocaine, 0.25% Bupivacaine mixed 1:150,000 with epinephrine.  The same local was used to perform infraorbital nerve blocks and to inject the hemitransfixion incisions in the septum, the intercartilaginous incisions and subnasal SMAS plane over the nasal dorsum.  Phenylephrine/lidocaine soaked pledgets, two on each side, were placed intranasally.  The right ear and nose were prepped and draped in the usual fashion.  Additional local was placed in the inferior turbinates after removal of the phenylephrine/lidocaine soaked pledgets.    A 15 blade was used to make an incision in the antihelical fold and an anteriorly-based skin-perichondrial flap was elevated.  A concha cymba and concha caval graft was harvested in the usual fashion preserving the posterior perichondrium.  Meticulous hemostasis was achieved in the wound and the incision was closed with a running interlocking 5-0 fast absorbing gut suture.  A Telfa sandwich  type dressing was placed and secured with a through-and-through 3-0 nylon suture.    Attention was directed to the inferior turbinates.  A 15 blade was used to make an incision at the head of the inferior turbinate on the right and extended along the inferior margin.  Medial mucoperiosteum was then elevated with a Cottle elevator and lateral mucoperiosteum as well as conchal bone were resected with a Optometrist.  The inferior turbinate remnant was then lateralized with a Bouie elevator and the inferior margin was cauterized with suction cautery.  An identical procedure was performed on the left inferior turbinate.   The attention was directed to the nose where the previous septoplasty was noted to have incompletely corrected the septal deformity which was caudally deviated to the left and superiorly deviated to the left. A hemitransfixion incision was made after local anesthesia had been injected, and the submucoperichondrial, mucoperiosteal flaps were elevated. The deformed portions of cartilage and perpendicular plate of the ethmoid were removed leaving the remaining septum in position. Once satisfactory reduction of the septal deformity had been accomplished, especially superiorly on the left, the 4-0 chromic whipstitch was placed. The hemitransfixion incision was closed with horizontal mattress 4-0 plain gut. The  intercartilaginous incisions were then made, and a subnasal SMAS plane was elevated over the nasal dorsum to accommodate the conchal cartilage graft. The graft was carved, slightly asymmetrically to asymmetrically enlarge the left valve as well as the right valve but to achieve adequate symmetry. The graft was then secured at 2 points on each side to the caudal margin of the upper lateral cartilages with 5-0 PDS sutures. Once this had been accomplished, the intercartilaginous incisions were closed with 5-0 chromic, and the inferior  turbinates were out-fractured with a Scientist, clinical (histocompatibility and immunogenetics).  Approximately 0.5 unit of Surgiflo was placed, followed by Telfa pledgets tied over the columella. The nasal dorsum was then stabilized with Mastisol and half-inch paper tape. The patient was returned to Anesthesia, allowed to emerge from anesthesia in the operating room, and taken to the recovery room  in stable condition.  There were no complications.  Estimated blood loss: 20 mL.   ____________________________ J. Nadeen Landau, MD jmc:cb D: 10/30/2012 13:44:00 ET T: 10/30/2012 16:12:43 ET JOB#: [id number]  cc: Janalee Dane, MD, <Dictator> Nicholos Johns MD ELECTRONICALLY SIGNED 12/02/2012 19:25

## 2015-08-03 DIAGNOSIS — M76899 Other specified enthesopathies of unspecified lower limb, excluding foot: Secondary | ICD-10-CM | POA: Insufficient documentation

## 2016-01-04 DIAGNOSIS — S86911A Strain of unspecified muscle(s) and tendon(s) at lower leg level, right leg, initial encounter: Secondary | ICD-10-CM | POA: Insufficient documentation

## 2016-01-17 ENCOUNTER — Encounter: Payer: Self-pay | Admitting: *Deleted

## 2016-01-17 ENCOUNTER — Other Ambulatory Visit: Payer: Medicare Other

## 2016-01-17 NOTE — Patient Instructions (Signed)
  Your procedure is scheduled on: 01-19-16 (THURSDAY) Report to [place]. To find out your arrival time please call [phone] between 1PM - 3PM on 01-18-16 [facility])  Remember: Instructions that are not followed completely may result in serious medical risk, up to and including death, or upon the discretion of your surgeon and anesthesiologist your surgery may need to be rescheduled.    _X___ 1. Do not eat food or drink liquids after midnight. No gum chewing or hard candies.     _X___ 2. No Alcohol for 24 hours before or after surgery.   ____ 3. Bring all medications with you on the day of surgery if instructed.    _X___ 4. Notify your doctor if there is any change in your medical condition     (cold, fever, infections).     Do not wear jewelry, make-up, hairpins, clips or nail polish.  Do not wear lotions, powders, or perfumes. You may wear deodorant.  Do not shave 48 hours prior to surgery. Men may shave face and neck.  Do not bring valuables to the hospital.    [facility] is not responsible for any belongings or valuables.               Contacts, dentures or bridgework may not be worn into surgery.  Leave your suitcase in the car. After surgery it may be brought to your room.  For patients admitted to the hospital, discharge time is determined by your treatment team.   Patients discharged the day of surgery will not be allowed to drive home.   Please read over the following fact sheets that you were given:     _X___ Take these medicines the morning of surgery with A SIP OF WATER:    1. LISINOPRIL  2. OPANA (OXYMORPHONE)  3. PRILOSEC  4.TAKE AN EXTRA PRILOSEC ON Wednesday NIGHT  5.  6.  ____ Fleet Enema (as directed)   _X___ Use CHG Soap as directed  ____ Use inhalers on the day of surgery  _X___ Stop metformin 2 days prior to surgery-STOP NOW (01-17-16) LAST DOSE WAS AM DOSE    ____ Take 1/2 of usual insulin dose the night before  surgery and none on the morning of surgery.   ____ Stop Coumadin/Plavix/aspirin-N/A  ____ Stop Anti-inflammatories-NO NSAIDS OR ASPIRIN PRODUCTS   _X___ Stop supplements until after surgery-STOP FISH OIL NOW AND TURMERIC NOW  ____ Bring C-Pap to the hospital.

## 2016-01-18 ENCOUNTER — Encounter
Admission: RE | Admit: 2016-01-18 | Discharge: 2016-01-18 | Disposition: A | Discharge status: Home / Self Care | Payer: Medicare Other | Source: Ambulatory Visit | Attending: Surgery | Admitting: Surgery

## 2016-01-18 DIAGNOSIS — E785 Hyperlipidemia, unspecified: Secondary | ICD-10-CM | POA: Diagnosis not present

## 2016-01-18 DIAGNOSIS — M479 Spondylosis, unspecified: Secondary | ICD-10-CM | POA: Diagnosis not present

## 2016-01-18 DIAGNOSIS — K219 Gastro-esophageal reflux disease without esophagitis: Secondary | ICD-10-CM | POA: Diagnosis not present

## 2016-01-18 DIAGNOSIS — G8929 Other chronic pain: Secondary | ICD-10-CM | POA: Diagnosis not present

## 2016-01-18 DIAGNOSIS — I10 Essential (primary) hypertension: Secondary | ICD-10-CM | POA: Diagnosis not present

## 2016-01-18 DIAGNOSIS — Z888 Allergy status to other drugs, medicaments and biological substances status: Secondary | ICD-10-CM | POA: Diagnosis not present

## 2016-01-18 DIAGNOSIS — Z8249 Family history of ischemic heart disease and other diseases of the circulatory system: Secondary | ICD-10-CM | POA: Diagnosis not present

## 2016-01-18 DIAGNOSIS — E119 Type 2 diabetes mellitus without complications: Secondary | ICD-10-CM | POA: Diagnosis not present

## 2016-01-18 DIAGNOSIS — S83241A Other tear of medial meniscus, current injury, right knee, initial encounter: Secondary | ICD-10-CM | POA: Diagnosis not present

## 2016-01-18 DIAGNOSIS — E890 Postprocedural hypothyroidism: Secondary | ICD-10-CM | POA: Diagnosis not present

## 2016-01-18 DIAGNOSIS — Z79899 Other long term (current) drug therapy: Secondary | ICD-10-CM | POA: Diagnosis not present

## 2016-01-18 DIAGNOSIS — Z87442 Personal history of urinary calculi: Secondary | ICD-10-CM | POA: Diagnosis not present

## 2016-01-18 DIAGNOSIS — Z833 Family history of diabetes mellitus: Secondary | ICD-10-CM | POA: Diagnosis not present

## 2016-01-18 DIAGNOSIS — X500XXA Overexertion from strenuous movement or load, initial encounter: Secondary | ICD-10-CM | POA: Diagnosis not present

## 2016-01-18 DIAGNOSIS — Z981 Arthrodesis status: Secondary | ICD-10-CM | POA: Diagnosis not present

## 2016-01-18 DIAGNOSIS — Z801 Family history of malignant neoplasm of trachea, bronchus and lung: Secondary | ICD-10-CM | POA: Diagnosis not present

## 2016-01-18 LAB — BASIC METABOLIC PANEL
ANION GAP: 6 (ref 5–15)
BUN: 12 mg/dL (ref 6–20)
CALCIUM: 8.6 mg/dL — AB (ref 8.9–10.3)
CO2: 27 mmol/L (ref 22–32)
Chloride: 102 mmol/L (ref 101–111)
Creatinine, Ser: 1.04 mg/dL (ref 0.61–1.24)
Glucose, Bld: 197 mg/dL — ABNORMAL HIGH (ref 65–99)
POTASSIUM: 4 mmol/L (ref 3.5–5.1)
Sodium: 135 mmol/L (ref 135–145)

## 2016-01-19 ENCOUNTER — Ambulatory Visit: Payer: Medicare Other | Admitting: Anesthesiology

## 2016-01-19 ENCOUNTER — Encounter: Payer: Self-pay | Admitting: *Deleted

## 2016-01-19 ENCOUNTER — Ambulatory Visit
Admission: RE | Admit: 2016-01-19 | Discharge: 2016-01-19 | Disposition: A | Discharge status: Home / Self Care | Payer: Medicare Other | Source: Ambulatory Visit | Attending: Surgery | Admitting: Surgery

## 2016-01-19 ENCOUNTER — Encounter
Admission: RE | Disposition: A | Discharge status: Home / Self Care | Payer: Self-pay | Source: Ambulatory Visit | Attending: Surgery

## 2016-01-19 DIAGNOSIS — E119 Type 2 diabetes mellitus without complications: Secondary | ICD-10-CM | POA: Insufficient documentation

## 2016-01-19 DIAGNOSIS — K219 Gastro-esophageal reflux disease without esophagitis: Secondary | ICD-10-CM | POA: Insufficient documentation

## 2016-01-19 DIAGNOSIS — Z833 Family history of diabetes mellitus: Secondary | ICD-10-CM | POA: Insufficient documentation

## 2016-01-19 DIAGNOSIS — Z801 Family history of malignant neoplasm of trachea, bronchus and lung: Secondary | ICD-10-CM | POA: Insufficient documentation

## 2016-01-19 DIAGNOSIS — X500XXA Overexertion from strenuous movement or load, initial encounter: Secondary | ICD-10-CM | POA: Insufficient documentation

## 2016-01-19 DIAGNOSIS — S83241A Other tear of medial meniscus, current injury, right knee, initial encounter: Secondary | ICD-10-CM | POA: Insufficient documentation

## 2016-01-19 DIAGNOSIS — G8929 Other chronic pain: Secondary | ICD-10-CM | POA: Insufficient documentation

## 2016-01-19 DIAGNOSIS — I10 Essential (primary) hypertension: Secondary | ICD-10-CM | POA: Insufficient documentation

## 2016-01-19 DIAGNOSIS — Z79899 Other long term (current) drug therapy: Secondary | ICD-10-CM | POA: Insufficient documentation

## 2016-01-19 DIAGNOSIS — Z981 Arthrodesis status: Secondary | ICD-10-CM | POA: Insufficient documentation

## 2016-01-19 DIAGNOSIS — M479 Spondylosis, unspecified: Secondary | ICD-10-CM | POA: Insufficient documentation

## 2016-01-19 DIAGNOSIS — E785 Hyperlipidemia, unspecified: Secondary | ICD-10-CM | POA: Insufficient documentation

## 2016-01-19 DIAGNOSIS — E890 Postprocedural hypothyroidism: Secondary | ICD-10-CM | POA: Insufficient documentation

## 2016-01-19 DIAGNOSIS — Z8249 Family history of ischemic heart disease and other diseases of the circulatory system: Secondary | ICD-10-CM | POA: Insufficient documentation

## 2016-01-19 DIAGNOSIS — Z87442 Personal history of urinary calculi: Secondary | ICD-10-CM | POA: Insufficient documentation

## 2016-01-19 DIAGNOSIS — Z888 Allergy status to other drugs, medicaments and biological substances status: Secondary | ICD-10-CM | POA: Insufficient documentation

## 2016-01-19 HISTORY — PX: KNEE ARTHROSCOPY WITH MENISCAL REPAIR: SHX5653

## 2016-01-19 HISTORY — DX: Chronic kidney disease, unspecified: N18.9

## 2016-01-19 HISTORY — DX: Testicular hypofunction: E29.1

## 2016-01-19 HISTORY — DX: Hyperlipidemia, unspecified: E78.5

## 2016-01-19 HISTORY — DX: Other chronic pain: G89.29

## 2016-01-19 HISTORY — DX: Gastro-esophageal reflux disease without esophagitis: K21.9

## 2016-01-19 HISTORY — DX: Unspecified osteoarthritis, unspecified site: M19.90

## 2016-01-19 HISTORY — DX: Type 2 diabetes mellitus without complications: E11.9

## 2016-01-19 LAB — GLUCOSE, CAPILLARY
Glucose-Capillary: 92 mg/dL (ref 65–99)
Glucose-Capillary: 118 mg/dL — ABNORMAL HIGH (ref 65–99)

## 2016-01-19 SURGERY — ARTHROSCOPY, KNEE, WITH MENISCUS REPAIR
Anesthesia: General | Laterality: Right | Wound class: Clean

## 2016-01-19 MED ORDER — LIDOCAINE HCL (PF) 1 % IJ SOLN
INTRAMUSCULAR | Status: AC
  Filled 2016-01-19: qty 30

## 2016-01-19 MED ORDER — GLYCOPYRROLATE 0.2 MG/ML IJ SOLN
INTRAMUSCULAR | Status: DC | PRN
  Administered 2016-01-19: 0.2 mg via INTRAVENOUS

## 2016-01-19 MED ORDER — ONDANSETRON HCL 4 MG PO TABS: 4.0000 mg | ORAL_TABLET | Freq: Four times a day (QID) | ORAL | Status: DC | PRN

## 2016-01-19 MED ORDER — SODIUM CHLORIDE 0.9 % IV SOLN
INTRAVENOUS | Status: DC
  Administered 2016-01-19: 13:00:00 via INTRAVENOUS

## 2016-01-19 MED ORDER — METOCLOPRAMIDE HCL 5 MG/ML IJ SOLN: 5.0000 mg | Freq: Three times a day (TID) | INTRAMUSCULAR | Status: DC | PRN

## 2016-01-19 MED ORDER — ONDANSETRON HCL 4 MG/2ML IJ SOLN: 4.0000 mg | Freq: Once | INTRAMUSCULAR | Status: DC | PRN

## 2016-01-19 MED ORDER — OXYCODONE HCL 5 MG PO TABS
ORAL_TABLET | ORAL | Status: AC
  Administered 2016-01-19: 5 mg via ORAL
  Filled 2016-01-19: qty 1

## 2016-01-19 MED ORDER — OXYCODONE HCL 5 MG PO TABS
5.0000 mg | ORAL_TABLET | ORAL | Status: DC | PRN
  Administered 2016-01-19: 5 mg via ORAL

## 2016-01-19 MED ORDER — EPHEDRINE SULFATE 50 MG/ML IJ SOLN
INTRAMUSCULAR | Status: DC | PRN
  Administered 2016-01-19 (×2): 7.5 mg via INTRAVENOUS

## 2016-01-19 MED ORDER — CEFAZOLIN SODIUM-DEXTROSE 2-4 GM/100ML-% IV SOLN
2.0000 g | Freq: Once | INTRAVENOUS | Status: AC
  Administered 2016-01-19: 2 g via INTRAVENOUS

## 2016-01-19 MED ORDER — FENTANYL CITRATE (PF) 100 MCG/2ML IJ SOLN
INTRAMUSCULAR | Status: DC | PRN
  Administered 2016-01-19: 50 ug via INTRAVENOUS
  Administered 2016-01-19: 150 ug via INTRAVENOUS

## 2016-01-19 MED ORDER — ONDANSETRON HCL 4 MG/2ML IJ SOLN: 4.0000 mg | Freq: Four times a day (QID) | INTRAMUSCULAR | Status: DC | PRN

## 2016-01-19 MED ORDER — MIDAZOLAM HCL 2 MG/2ML IJ SOLN
INTRAMUSCULAR | Status: DC | PRN
  Administered 2016-01-19: 2 mg via INTRAVENOUS

## 2016-01-19 MED ORDER — POTASSIUM CHLORIDE IN NACL 20-0.9 MEQ/L-% IV SOLN: INTRAVENOUS | Status: DC

## 2016-01-19 MED ORDER — LIDOCAINE HCL (CARDIAC) 20 MG/ML IV SOLN
INTRAVENOUS | Status: DC | PRN
  Administered 2016-01-19: 100 mg via INTRAVENOUS

## 2016-01-19 MED ORDER — CEFAZOLIN SODIUM-DEXTROSE 2-4 GM/100ML-% IV SOLN
INTRAVENOUS | Status: DC
Start: 2016-01-19 — End: 2016-01-19
  Filled 2016-01-19: qty 100

## 2016-01-19 MED ORDER — FENTANYL CITRATE (PF) 100 MCG/2ML IJ SOLN: 25.0000 ug | INTRAMUSCULAR | Status: DC | PRN

## 2016-01-19 MED ORDER — KETOROLAC TROMETHAMINE 30 MG/ML IJ SOLN
INTRAMUSCULAR | Status: DC | PRN
  Administered 2016-01-19: 30 mg via INTRAVENOUS

## 2016-01-19 MED ORDER — KETAMINE HCL 50 MG/ML IJ SOLN
INTRAMUSCULAR | Status: DC | PRN
  Administered 2016-01-19: 50 mg via INTRAVENOUS

## 2016-01-19 MED ORDER — BUPIVACAINE-EPINEPHRINE (PF) 0.5% -1:200000 IJ SOLN
INTRAMUSCULAR | Status: AC
  Filled 2016-01-19: qty 60

## 2016-01-19 MED ORDER — METOCLOPRAMIDE HCL 10 MG PO TABS: 5.0000 mg | ORAL_TABLET | Freq: Three times a day (TID) | ORAL | Status: DC | PRN

## 2016-01-19 MED ORDER — PROPOFOL 10 MG/ML IV BOLUS
INTRAVENOUS | Status: DC | PRN
  Administered 2016-01-19: 200 mg via INTRAVENOUS

## 2016-01-19 SURGICAL SUPPLY — 31 items
BAG COUNTER SPONGE EZ (MISCELLANEOUS) IMPLANT
BAG SPNG 4X4 CLR HAZ (MISCELLANEOUS)
BANDAGE ACE 6X5 VEL STRL LF (GAUZE/BANDAGES/DRESSINGS) ×2 IMPLANT
BLADE FULL RADIUS 3.5 (BLADE) ×2 IMPLANT
BLADE SHAVER 4.5X7 STR FR (MISCELLANEOUS) ×2 IMPLANT
CHLORAPREP W/TINT 26ML (MISCELLANEOUS) ×2 IMPLANT
ELECT REM PT RETURN 9FT ADLT (ELECTROSURGICAL) ×2
ELECTRODE REM PT RTRN 9FT ADLT (ELECTROSURGICAL) ×1 IMPLANT
GAUZE SPONGE 4X4 12PLY STRL (GAUZE/BANDAGES/DRESSINGS) ×2 IMPLANT
GLOVE BIO SURGEON STRL SZ8 (GLOVE) ×4 IMPLANT
GLOVE BIOGEL M 7.0 STRL (GLOVE) ×4 IMPLANT
GLOVE BIOGEL PI IND STRL 7.5 (GLOVE) ×1 IMPLANT
GLOVE BIOGEL PI INDICATOR 7.5 (GLOVE) ×1
GLOVE INDICATOR 8.0 STRL GRN (GLOVE) ×2 IMPLANT
GOWN STRL REUS W/ TWL LRG LVL3 (GOWN DISPOSABLE) ×1 IMPLANT
GOWN STRL REUS W/ TWL XL LVL3 (GOWN DISPOSABLE) ×2 IMPLANT
GOWN STRL REUS W/TWL LRG LVL3 (GOWN DISPOSABLE) ×2
GOWN STRL REUS W/TWL XL LVL3 (GOWN DISPOSABLE) ×4
IV LACTATED RINGER IRRG 3000ML (IV SOLUTION) ×2
IV LR IRRIG 3000ML ARTHROMATIC (IV SOLUTION) ×1 IMPLANT
KIT RM TURNOVER STRD PROC AR (KITS) ×2 IMPLANT
MANIFOLD NEPTUNE II (INSTRUMENTS) ×2 IMPLANT
NDL HYPO 21X1.5 SAFETY (NEEDLE) ×1 IMPLANT
NEEDLE HYPO 21X1.5 SAFETY (NEEDLE) ×2 IMPLANT
PACK ARTHROSCOPY KNEE (MISCELLANEOUS) ×2 IMPLANT
PENCIL ELECTRO HAND CTR (MISCELLANEOUS) ×2 IMPLANT
SUT PROLENE 4 0 PS 2 18 (SUTURE) ×2 IMPLANT
SUT TI-CRON 2-0 W/10 SWGD (SUTURE) IMPLANT
SYR 50ML LL SCALE MARK (SYRINGE) ×2 IMPLANT
TUBING ARTHRO INFLOW-ONLY STRL (TUBING) ×2 IMPLANT
WAND HAND CNTRL MULTIVAC 90 (MISCELLANEOUS) ×2 IMPLANT

## 2016-01-19 NOTE — Anesthesia Preprocedure Evaluation (Addendum)
Anesthesia Evaluation  Patient identified by MRN, date of birth, ID band Patient awake    Reviewed: Allergy & Precautions, H&P , NPO status , Patient's Chart, lab work & pertinent test results, reviewed documented beta blocker date and time   Airway Mallampati: II  TM Distance: >3 FB Neck ROM: full    Dental  (+) Teeth Intact   Pulmonary neg pulmonary ROS,    Pulmonary exam normal        Cardiovascular Exercise Tolerance: Good negative cardio ROS Normal cardiovascular exam Rate:Normal     Neuro/Psych negative neurological ROS  negative psych ROS   GI/Hepatic negative GI ROS, Neg liver ROS, GERD  ,  Endo/Other  negative endocrine ROSdiabetes  Renal/GU Renal diseasenegative Renal ROS  negative genitourinary   Musculoskeletal   Abdominal   Peds  Hematology negative hematology ROS (+)   Anesthesia Other Findings   Reproductive/Obstetrics negative OB ROS                            Anesthesia Physical Anesthesia Plan  ASA: III  Anesthesia Plan: General LMA   Post-op Pain Management:    Induction:   Airway Management Planned:   Additional Equipment:   Intra-op Plan:   Post-operative Plan:   Informed Consent: I have reviewed the patients History and Physical, chart, labs and discussed the procedure including the risks, benefits and alternatives for the proposed anesthesia with the patient or authorized representative who has indicated his/her understanding and acceptance.     Plan Discussed with: CRNA  Anesthesia Plan Comments:        Anesthesia Quick Evaluation

## 2016-01-19 NOTE — Discharge Instructions (Addendum)
Keep dressing dry and intact.  May shower after dressing changed on post-op day #4 (Monday).  Cover sutures with Band-Aids after drying off. Apply ice frequently to knee or use Polar Care. May weight-bear as tolerated - use crutches or walker as needed.  Follow-up in 10-14 days or as scheduled                                                                               .AMBULATORY SURGERY  DISCHARGE INSTRUCTIONS   1) The drugs that you were given will stay in your system until tomorrow so for the next 24 hours you should not:  A) Drive an automobile B) Make any legal decisions C) Drink any alcoholic beverage   2) You may resume regular meals tomorrow.  Today it is better to start with liquids and gradually work up to solid foods.  You may eat anything you prefer, but it is better to start with liquids, then soup and crackers, and gradually work up to solid foods.   3) Please notify your doctor immediately if you have any unusual bleeding, trouble breathing, redness and pain at the surgery site, drainage, fever, or pain not relieved by medication.    Additional Instructions:Take stool softeners along with pain medication. Drink plenty of fluids. Follow physician specific instructions above.   Please contact your physician with any problems or Same Day Surgery at [phone], Monday through Friday 6 am to 4 pm, or [place] at [facility] number at [phone].

## 2016-01-19 NOTE — H&P (Signed)
Paper H&P to be scanned into permanent record. H&P reviewed. No changes. 

## 2016-01-19 NOTE — Op Note (Signed)
01/19/2016  2:34 PM  Patient:   [name] Slipper Wettstein Jr.  Pre-Op Diagnosis:   Medial meniscus tear, right knee.  Postoperative diagnosis:   Same.  Procedure:   Arthroscopic partial medial meniscectomy, right knee.  Surgeon:   Pascal Lux, M.D.  Anesthesia:   General LMA.  Findings:   As above. There was a unstable large flap tear of the postero-medial portion of the medial meniscus. The lateral meniscus was in excellent condition, as were the anterior and posterior cruciate ligaments. There were some grade 1-2 chondromalacial changes involving the medial femoral condyle. Otherwise, the articular surfaces of the patella, the femur, and the tibia all were in satisfactory condition.  Complications:   None.  EBL:   <5 cc.  Total fluids:   700 cc of crystalloid.  Tourniquet time:   None  Drains:   None  Closure:   4-0 Prolene interrupted sutures.  Brief clinical note:   The patient is a 45 year old male who developed the sudden onset of medial sided right knee pain approximate 5-6 weeks ago when he arose squatting position and felt a pop. His history and examination were consistent with a medial meniscus tear. His symptoms have persisted despite medications, activity modification, and a steroid injection. The patient is unable to undergo an MRI scan due to having an implanted spinal cord stimulator. The patient presents at this time for arthroscopy, debridement, and partial medial meniscectomy.  Procedure:   The patient was brought into the operating room and lain in the supine position. After adequate general laryngeal mask anesthesia was obtained, a timeout was performed to verify the appropriate side. The patient's right knee was injected sterilely using a solution of 30 cc of 1% lidocaine and 30 cc of 0.5% Sensorcaine with epinephrine. The right lower extremity was prepped with ChloraPrep solution before being draped sterilely. Preoperative antibiotics were administered. The expected  portal sites were injected with 0.5% Sensorcaine with epinephrine before the camera was placed in the anterolateral portal and instrumentation performed through the anteromedial portal. The knee was sequentially examined beginning in the suprapatellar pouch, then progressing to the patellofemoral space, the medial gutter compartment, the notch, and finally the lateral compartment and gutter. The findings were as described above. Abundant reactive synovial tissues anteriorly were debrided using the full-radius resector in order to improve visualization. The unstable flap component of the medial meniscus tear had flipped superiorly onto the lateral meniscus. This piece was reduced using a probe before it was removed piecemeal using straight and narrow up-biting baskets as well as a full-radius resector. Subsequent probing of the remaining rim demonstrated good stability. Laterally, the meniscus was intact to probing, as were the anterior and posterior cruciate ligaments. The instruments were removed from the joint after suctioning the excess fluid. The portal sites were closed using 4-0 Prolene interrupted sutures before a sterile bulky dressing was applied to the knee. The patient was then awakened, extubated, and returned to the recovery room in satisfactory condition after tolerating the procedure well.

## 2016-01-19 NOTE — Transfer of Care (Signed)
Immediate Anesthesia Transfer of Care Note  Patient: [name] Slipper Fuston Jr.  Procedure(s) Performed: Procedure(s): KNEE ARTHROSCOPY WITH MENISCAL REPAIR (Right)  Patient Location: PACU  Anesthesia Type:General  Level of Consciousness: awake, alert , oriented and patient cooperative  Airway & Oxygen Therapy: Patient Spontanous Breathing and Patient connected to nasal cannula oxygen  Post-op Assessment: Report given to RN and Post -op Vital signs reviewed and stable  Post vital signs: Reviewed and stable  Last Vitals:  Filed Vitals:   01/19/16 1223 01/19/16 1448  BP: 117/86 144/84  Pulse: 85 120  Temp: 36.1 C 36.7 C  Resp: 16 18    Complications: No apparent anesthesia complications

## 2016-01-19 NOTE — Anesthesia Procedure Notes (Signed)
Procedure Name: LMA Insertion Date/Time: 01/19/2016 1:40 PM Performed by: Rosaria Ferries, Season Astacio Pre-anesthesia Checklist: Patient identified, Emergency Drugs available, Suction available and Patient being monitored Patient Re-evaluated:Patient Re-evaluated prior to inductionOxygen Delivery Method: Circle system utilized Preoxygenation: Pre-oxygenation with 100% oxygen Intubation Type: IV induction LMA Size: 5.0 Number of attempts: 1 Placement Confirmation: breath sounds checked- equal and bilateral Tube secured with: Tape Dental Injury: Teeth and Oropharynx as per pre-operative assessment

## 2016-01-20 ENCOUNTER — Encounter: Payer: Self-pay | Admitting: Surgery

## 2016-01-20 NOTE — Anesthesia Postprocedure Evaluation (Signed)
Anesthesia Post Note  Patient: [name] Slipper Souder Jr.  Procedure(s) Performed: Procedure(s) (LRB): KNEE ARTHROSCOPY WITH MENISCAL REPAIR (Right)  Patient location during evaluation: PACU Anesthesia Type: General Level of consciousness: awake and alert Pain management: pain level controlled Vital Signs Assessment: post-procedure vital signs reviewed and stable Respiratory status: spontaneous breathing, nonlabored ventilation, respiratory function stable and patient connected to nasal cannula oxygen Cardiovascular status: blood pressure returned to baseline and stable Postop Assessment: no signs of nausea or vomiting Anesthetic complications: no    Last Vitals:  Filed Vitals:   01/19/16 1530 01/19/16 1536  BP: 126/72 141/79  Pulse: 92 93  Temp: 36.7 C 36.1 C  Resp: 16 16    Last Pain:  Filed Vitals:   01/19/16 1558  PainSc: 2                  Molli Barrows

## 2016-05-01 DIAGNOSIS — D443 Neoplasm of uncertain behavior of pituitary gland: Secondary | ICD-10-CM | POA: Insufficient documentation

## 2016-05-01 DIAGNOSIS — D352 Benign neoplasm of pituitary gland: Secondary | ICD-10-CM | POA: Insufficient documentation

## 2016-06-04 ENCOUNTER — Inpatient Hospital Stay: Admission: RE | Admit: 2016-06-04 | Payer: Medicare Other | Source: Ambulatory Visit

## 2016-06-04 NOTE — Patient Instructions (Addendum)
  Your procedure is scheduled on: 06-12-16 (TUESDAY) Report to Same Day Surgery 2nd floor medical mall To find out your arrival time please call [phone] between 1PM - 3PM on 06-11-16 [facility])  Remember: Instructions that are not followed completely may result in serious medical risk, up to and including death, or upon the discretion of your surgeon and anesthesiologist your surgery may need to be rescheduled.    _x___ 1. Do not eat food or drink liquids after midnight. No gum chewing or hard candies.     __x__ 2. No Alcohol for 24 hours before or after surgery.   __x__3. No Smoking for 24 prior to surgery.   ____  4. Bring all medications with you on the day of surgery if instructed.    __x__ 5. Notify your doctor if there is any change in your medical condition     (cold, fever, infections).     Do not wear jewelry, make-up, hairpins, clips or nail polish.  Do not wear lotions, powders, or perfumes. You may wear deodorant.  Do not shave 48 hours prior to surgery. Men may shave face and neck.  Do not bring valuables to the hospital.    [facility] is not responsible for any belongings or valuables.               Contacts, dentures or bridgework may not be worn into surgery.  Leave your suitcase in the car. After surgery it may be brought to your room.  For patients admitted to the hospital, discharge time is determined by your treatment team.   Patients discharged the day of surgery will not be allowed to drive home.    Please read over the following fact sheets that you were given:   [facility] Preparing for Surgery and or MRSA Information   _x___ Take these medicines the morning of surgery with A SIP OF WATER:    1. LISINOPRIL  2. MS CONTIN (MORPHINE)  3. MAGNESIUM  4. PRILOSEC  5. TAKE A PRILOSEC Monday NIGHT BEFORE BED  6.  ____ Fleet Enema (as directed)   _x___ Use CHG Soap or sage wipes as directed on instruction sheet   ____ Use inhalers on the day of surgery  and bring to hospital day of surgery  _X___ Stop metformin 2 days prior to surgery-LAST DOSE ON Saturday, 06-09-16    ____ Take 1/2 of usual insulin dose the night before surgery and none on the morning of  surgery.   ____ Stop aspirin or coumadin, or plavix  _x__ Stop Anti-inflammatories such as Advil, Aleve, Ibuprofen, Motrin, Naproxen,          Naprosyn, Goodies powders or aspirin products. Ok to take Tylenol.   _X___ Stop supplements until after surgery-STOP FLAX SEED AND FISH OIL NOW  ____ Bring C-Pap to the hospital.

## 2016-06-06 ENCOUNTER — Encounter
Admission: RE | Admit: 2016-06-06 | Discharge: 2016-06-06 | Disposition: A | Discharge status: Home / Self Care | Payer: Medicare Other | Source: Ambulatory Visit | Attending: Surgery | Admitting: Surgery

## 2016-06-06 ENCOUNTER — Encounter: Admission: RE | Admit: 2016-06-06 | Payer: Medicare Other | Source: Ambulatory Visit | Admitting: Surgery

## 2016-06-06 DIAGNOSIS — Z01812 Encounter for preprocedural laboratory examination: Secondary | ICD-10-CM | POA: Insufficient documentation

## 2016-06-06 LAB — BASIC METABOLIC PANEL
Anion gap: 8 (ref 5–15)
BUN: 10 mg/dL (ref 6–20)
CALCIUM: 9.1 mg/dL (ref 8.9–10.3)
CO2: 30 mmol/L (ref 22–32)
CREATININE: 1.1 mg/dL (ref 0.61–1.24)
Chloride: 99 mmol/L — ABNORMAL LOW (ref 101–111)
GFR calc Af Amer: >60 mL/min (ref >60)
Glucose, Bld: 198 mg/dL — ABNORMAL HIGH (ref 65–99)
POTASSIUM: 4.4 mmol/L (ref 3.5–5.1)
SODIUM: 137 mmol/L (ref 135–145)

## 2016-06-12 ENCOUNTER — Encounter
Admission: RE | Disposition: A | Discharge status: Home / Self Care | Payer: Self-pay | Source: Ambulatory Visit | Attending: Surgery

## 2016-06-12 ENCOUNTER — Ambulatory Visit: Payer: Medicare Other | Admitting: Anesthesiology

## 2016-06-12 ENCOUNTER — Ambulatory Visit
Admission: RE | Admit: 2016-06-12 | Discharge: 2016-06-12 | Disposition: A | Discharge status: Home / Self Care | Payer: Medicare Other | Source: Ambulatory Visit | Attending: Surgery | Admitting: Surgery

## 2016-06-12 ENCOUNTER — Encounter: Payer: Self-pay | Admitting: *Deleted

## 2016-06-12 DIAGNOSIS — K219 Gastro-esophageal reflux disease without esophagitis: Secondary | ICD-10-CM | POA: Insufficient documentation

## 2016-06-12 DIAGNOSIS — Z7984 Long term (current) use of oral hypoglycemic drugs: Secondary | ICD-10-CM | POA: Insufficient documentation

## 2016-06-12 DIAGNOSIS — Z87442 Personal history of urinary calculi: Secondary | ICD-10-CM | POA: Diagnosis not present

## 2016-06-12 DIAGNOSIS — E890 Postprocedural hypothyroidism: Secondary | ICD-10-CM | POA: Insufficient documentation

## 2016-06-12 DIAGNOSIS — M5137 Other intervertebral disc degeneration, lumbosacral region: Secondary | ICD-10-CM | POA: Diagnosis not present

## 2016-06-12 DIAGNOSIS — E119 Type 2 diabetes mellitus without complications: Secondary | ICD-10-CM | POA: Insufficient documentation

## 2016-06-12 DIAGNOSIS — Z8249 Family history of ischemic heart disease and other diseases of the circulatory system: Secondary | ICD-10-CM | POA: Insufficient documentation

## 2016-06-12 DIAGNOSIS — M23221 Derangement of posterior horn of medial meniscus due to old tear or injury, right knee: Secondary | ICD-10-CM | POA: Diagnosis not present

## 2016-06-12 DIAGNOSIS — Z801 Family history of malignant neoplasm of trachea, bronchus and lung: Secondary | ICD-10-CM | POA: Diagnosis not present

## 2016-06-12 DIAGNOSIS — Z833 Family history of diabetes mellitus: Secondary | ICD-10-CM | POA: Diagnosis not present

## 2016-06-12 DIAGNOSIS — G8929 Other chronic pain: Secondary | ICD-10-CM | POA: Insufficient documentation

## 2016-06-12 DIAGNOSIS — Z79899 Other long term (current) drug therapy: Secondary | ICD-10-CM | POA: Insufficient documentation

## 2016-06-12 DIAGNOSIS — M94261 Chondromalacia, right knee: Secondary | ICD-10-CM | POA: Insufficient documentation

## 2016-06-12 DIAGNOSIS — E892 Postprocedural hypoparathyroidism: Secondary | ICD-10-CM | POA: Insufficient documentation

## 2016-06-12 DIAGNOSIS — I10 Essential (primary) hypertension: Secondary | ICD-10-CM | POA: Diagnosis not present

## 2016-06-12 DIAGNOSIS — Z888 Allergy status to other drugs, medicaments and biological substances status: Secondary | ICD-10-CM | POA: Diagnosis not present

## 2016-06-12 DIAGNOSIS — E785 Hyperlipidemia, unspecified: Secondary | ICD-10-CM | POA: Diagnosis not present

## 2016-06-12 DIAGNOSIS — S83241A Other tear of medial meniscus, current injury, right knee, initial encounter: Secondary | ICD-10-CM | POA: Diagnosis present

## 2016-06-12 DIAGNOSIS — M659 Synovitis and tenosynovitis, unspecified: Secondary | ICD-10-CM | POA: Insufficient documentation

## 2016-06-12 HISTORY — PX: KNEE ARTHROSCOPY WITH MENISCAL REPAIR: SHX5653

## 2016-06-12 LAB — GLUCOSE, CAPILLARY
GLUCOSE-CAPILLARY: 127 mg/dL — AB (ref 65–99)
Glucose-Capillary: 120 mg/dL — ABNORMAL HIGH (ref 65–99)

## 2016-06-12 SURGERY — ARTHROSCOPY, KNEE, WITH MENISCUS REPAIR
Anesthesia: General | Site: Knee | Laterality: Right | Wound class: Clean

## 2016-06-12 MED ORDER — FENTANYL CITRATE (PF) 100 MCG/2ML IJ SOLN
INTRAMUSCULAR | Status: AC
  Filled 2016-06-12: qty 2

## 2016-06-12 MED ORDER — ONDANSETRON HCL 4 MG/2ML IJ SOLN: 4.0000 mg | Freq: Four times a day (QID) | INTRAMUSCULAR | Status: DC | PRN

## 2016-06-12 MED ORDER — ONDANSETRON HCL 4 MG/2ML IJ SOLN: 4.0000 mg | Freq: Once | INTRAMUSCULAR | Status: DC | PRN

## 2016-06-12 MED ORDER — KETOROLAC TROMETHAMINE 30 MG/ML IJ SOLN
INTRAMUSCULAR | Status: DC | PRN
  Administered 2016-06-12: 30 mg via INTRAVENOUS

## 2016-06-12 MED ORDER — BUPIVACAINE-EPINEPHRINE (PF) 0.5% -1:200000 IJ SOLN
INTRAMUSCULAR | Status: DC | PRN
  Administered 2016-06-12: 15 mL via PERINEURAL
  Administered 2016-06-12: 30 mL via PERINEURAL

## 2016-06-12 MED ORDER — PROPOFOL 10 MG/ML IV BOLUS
INTRAVENOUS | Status: DC | PRN
  Administered 2016-06-12: 200 mg via INTRAVENOUS

## 2016-06-12 MED ORDER — BUPIVACAINE-EPINEPHRINE (PF) 0.5% -1:200000 IJ SOLN
INTRAMUSCULAR | Status: AC
  Filled 2016-06-12: qty 60

## 2016-06-12 MED ORDER — FENTANYL CITRATE (PF) 100 MCG/2ML IJ SOLN
INTRAMUSCULAR | Status: DC | PRN
  Administered 2016-06-12 (×2): 100 ug via INTRAVENOUS

## 2016-06-12 MED ORDER — ONDANSETRON HCL 4 MG PO TABS: 4.0000 mg | ORAL_TABLET | Freq: Four times a day (QID) | ORAL | Status: DC | PRN

## 2016-06-12 MED ORDER — EPHEDRINE SULFATE 50 MG/ML IJ SOLN
INTRAMUSCULAR | Status: DC | PRN
  Administered 2016-06-12: 10 mg via INTRAVENOUS

## 2016-06-12 MED ORDER — CEFAZOLIN SODIUM-DEXTROSE 2-4 GM/100ML-% IV SOLN
INTRAVENOUS | Status: AC
  Filled 2016-06-12: qty 100

## 2016-06-12 MED ORDER — POTASSIUM CHLORIDE IN NACL 20-0.9 MEQ/L-% IV SOLN
INTRAVENOUS | Status: DC
  Filled 2016-06-12: qty 1000

## 2016-06-12 MED ORDER — FENTANYL CITRATE (PF) 100 MCG/2ML IJ SOLN
25.0000 ug | INTRAMUSCULAR | Status: DC | PRN
  Administered 2016-06-12 (×4): 25 ug via INTRAVENOUS

## 2016-06-12 MED ORDER — LIDOCAINE HCL 1 % IJ SOLN
INTRAMUSCULAR | Status: DC | PRN
  Administered 2016-06-12: 30 mL

## 2016-06-12 MED ORDER — LACTATED RINGERS IV SOLN
INTRAVENOUS | Status: DC | PRN
  Administered 2016-06-12 (×2): via INTRAVENOUS

## 2016-06-12 MED ORDER — OXYCODONE HCL 5 MG PO TABS: 5.0000 mg | ORAL_TABLET | ORAL | Status: DC | PRN

## 2016-06-12 MED ORDER — METOCLOPRAMIDE HCL 5 MG/ML IJ SOLN: 5.0000 mg | Freq: Three times a day (TID) | INTRAMUSCULAR | Status: DC | PRN

## 2016-06-12 MED ORDER — SODIUM CHLORIDE 0.9 % IV SOLN
INTRAVENOUS | Status: DC
  Administered 2016-06-12: 07:00:00 via INTRAVENOUS

## 2016-06-12 MED ORDER — METOCLOPRAMIDE HCL 10 MG PO TABS: 5.0000 mg | ORAL_TABLET | Freq: Three times a day (TID) | ORAL | Status: DC | PRN

## 2016-06-12 MED ORDER — MIDAZOLAM HCL 2 MG/2ML IJ SOLN
INTRAMUSCULAR | Status: DC | PRN
  Administered 2016-06-12: 2 mg via INTRAVENOUS

## 2016-06-12 MED ORDER — CEFAZOLIN SODIUM-DEXTROSE 2-4 GM/100ML-% IV SOLN
2.0000 g | Freq: Once | INTRAVENOUS | Status: AC
  Administered 2016-06-12: 2 g via INTRAVENOUS

## 2016-06-12 MED ORDER — FAMOTIDINE 20 MG PO TABS: 20.0000 mg | ORAL_TABLET | Freq: Once | ORAL | Status: DC

## 2016-06-12 MED ORDER — ONDANSETRON HCL 4 MG/2ML IJ SOLN
INTRAMUSCULAR | Status: DC | PRN
  Administered 2016-06-12: 4 mg via INTRAVENOUS

## 2016-06-12 MED ORDER — DEXAMETHASONE SODIUM PHOSPHATE 10 MG/ML IJ SOLN
INTRAMUSCULAR | Status: DC | PRN
  Administered 2016-06-12: 5 mg via INTRAVENOUS

## 2016-06-12 MED ORDER — LIDOCAINE HCL (CARDIAC) 20 MG/ML IV SOLN
INTRAVENOUS | Status: DC | PRN
  Administered 2016-06-12: 50 mg via INTRAVENOUS

## 2016-06-12 MED ORDER — LIDOCAINE HCL (PF) 1 % IJ SOLN
INTRAMUSCULAR | Status: AC
  Filled 2016-06-12: qty 30

## 2016-06-12 SURGICAL SUPPLY — 34 items
BAG COUNTER SPONGE EZ (MISCELLANEOUS) IMPLANT
BAG SPNG 4X4 CLR HAZ (MISCELLANEOUS)
BANDAGE ACE 6X5 VEL STRL LF (GAUZE/BANDAGES/DRESSINGS) ×2 IMPLANT
BLADE FULL RADIUS 3.5 (BLADE) ×2 IMPLANT
BLADE SHAVER 4.5X7 STR FR (MISCELLANEOUS) ×1 IMPLANT
CHLORAPREP W/TINT 26ML (MISCELLANEOUS) ×2 IMPLANT
CUFF TOURN 24 STER (MISCELLANEOUS) ×1 IMPLANT
CUFF TOURN 30 STER DUAL PORT (MISCELLANEOUS) IMPLANT
DRAPE IMP U-DRAPE 54X76 (DRAPES) ×2 IMPLANT
ELECT REM PT RETURN 9FT ADLT (ELECTROSURGICAL) ×2
ELECTRODE REM PT RTRN 9FT ADLT (ELECTROSURGICAL) ×1 IMPLANT
GAUZE SPONGE 4X4 12PLY STRL (GAUZE/BANDAGES/DRESSINGS) ×2 IMPLANT
GLOVE BIO SURGEON STRL SZ8 (GLOVE) ×4 IMPLANT
GLOVE BIOGEL M 7.0 STRL (GLOVE) ×4 IMPLANT
GLOVE BIOGEL PI IND STRL 7.5 (GLOVE) ×1 IMPLANT
GLOVE BIOGEL PI INDICATOR 7.5 (GLOVE) ×1
GLOVE INDICATOR 8.0 STRL GRN (GLOVE) ×2 IMPLANT
GOWN STRL REUS W/ TWL LRG LVL3 (GOWN DISPOSABLE) ×1 IMPLANT
GOWN STRL REUS W/ TWL XL LVL3 (GOWN DISPOSABLE) ×2 IMPLANT
GOWN STRL REUS W/TWL LRG LVL3 (GOWN DISPOSABLE) ×2
GOWN STRL REUS W/TWL XL LVL3 (GOWN DISPOSABLE) ×4
IV LACTATED RINGER IRRG 3000ML (IV SOLUTION) ×2
IV LR IRRIG 3000ML ARTHROMATIC (IV SOLUTION) ×1 IMPLANT
KIT RM TURNOVER STRD PROC AR (KITS) ×2 IMPLANT
MANIFOLD NEPTUNE II (INSTRUMENTS) ×2 IMPLANT
NDL HYPO 21X1.5 SAFETY (NEEDLE) ×1 IMPLANT
NEEDLE HYPO 21X1.5 SAFETY (NEEDLE) ×2 IMPLANT
PACK ARTHROSCOPY KNEE (MISCELLANEOUS) ×2 IMPLANT
PENCIL ELECTRO HAND CTR (MISCELLANEOUS) ×2 IMPLANT
SUT PROLENE 4 0 PS 2 18 (SUTURE) ×2 IMPLANT
SUT TI-CRON 2-0 W/10 SWGD (SUTURE) IMPLANT
SYR 50ML LL SCALE MARK (SYRINGE) ×2 IMPLANT
TUBING ARTHRO INFLOW-ONLY STRL (TUBING) ×2 IMPLANT
WAND HAND CNTRL MULTIVAC 90 (MISCELLANEOUS) ×2 IMPLANT

## 2016-06-12 NOTE — Transfer of Care (Signed)
Immediate Anesthesia Transfer of Care Note  Patient: [name] Slipper Vanasten Jr.  Procedure(s) Performed: Procedure(s): KNEE ARTHROSCOPY WITH PARTIAL MEDIAL MENISCAL REPAIR (Right)  Patient Location: PACU  Anesthesia Type:General  Level of Consciousness: sedated  Airway & Oxygen Therapy: Patient Spontanous Breathing and Patient connected to face mask oxygen  Post-op Assessment: Report given to RN and Post -op Vital signs reviewed and stable  Post vital signs: Reviewed and stable  Last Vitals:  Vitals:   06/12/16 0609 06/12/16 0840  BP: 112/78 (!) (P) 99/56  Pulse: 86   Resp: 20   Temp: 37.1 C (!) (P) 36.1 C    Last Pain:  Vitals:   06/12/16 0609  TempSrc: Oral  PainSc: 4          Complications: No apparent anesthesia complications

## 2016-06-12 NOTE — Op Note (Signed)
06/12/2016  8:49 AM  Patient:   [name] Slipper Schloss Jr.  Pre-Op Diagnosis:   Recurrent medial meniscus tear, right knee.  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  Procedure:   Arthroscopic partial medial meniscectomy with partial synovectomy and abrasion chondroplasty of grade 2 chondromalacia of medial compartment, right knee.  Surgeon:   Pascal Lux, M.D.  Anesthesia:   General LMA.  Findings:   As above. There were focal areas of grade 2 chondromalacia involving the medial portion of the weightbearing aspect of the medial femoral condyle, as well as the posterior portion of the medial tibial plateau. Laterally, the meniscus was in excellent condition, as were the articular surfaces of the lateral femoral condyle, the lateral tibial plateau, the femoral trochlea, and the patella. The anterior and posterior cruciate ligaments both were intact as well.  Complications:   None.  EBL:   5 cc.  Total fluids:   1000 cc of crystalloid.  Tourniquet time:   None  Drains:   None  Closure:   4-0 Prolene interrupted sutures.  Brief clinical note:   The patient is a 45 year old male who is now 5 months status post a right knee arthroscopy with partial medial meniscectomy. The patient notes recurrent medial sided right knee pain. His history and examination are consistent with a recurrent medial meniscus tear. He is unable to undergo an MRI scan due to the presence of an implanted spinal cord stimulator. The patient presents at this time for arthroscopy, debridement, and partial medial meniscectomy.  Procedure:   The patient was brought into the operating room and lain in the supine position. After adequate general laryngal mask anesthesia was obtained, a timeout was performed to verify the appropriate side. The patient's right knee was injected sterilely using a solution of 30 cc of 1% lidocaine and 30 cc of 0.5%  Sensorcaine with epinephrine. The right lower extremity was prepped with ChloraPrep solution before being draped sterilely. Preoperative antibiotics were administered. The expected portal sites were injected with 0.5% Sensorcaine with epinephrine before the camera was placed in the anterolateral portal and instrumentation performed through the anteromedial portal. The knee was sequentially examined beginning in the suprapatellar pouch, then progressing to the patellofemoral space, the medial gutter compartment, the notch, and finally the lateral compartment and gutter. The findings were as described above. Abundant reactive synovial tissues anteriorly were debrided using the full-radius resector in order to improve visualization. The medial meniscus was carefully probed. There were several areas of recurrent tearing along the anterior and posterior portions of the prior partial meniscectomy site. These areas were debrided back to stable margins using the straight and angled mini-munchers, as well as the full-radius resector. Subsequent probing demonstrated excellent stability. Areas of loose articular cartilage on the femur and tibia both were debrided back to stable margins using the full-radius resector as well. The ArthroCare wand was inserted and used to obtain hemostasis, as well as to debride areas of synovitis and thickened scar anteromedially. The instruments were removed from the joint after suctioning the excess fluid. The portal sites were closed using 4-0 Prolene interrupted sutures before a sterile bulky dressing was applied to the knee. The patient was then awakened, extubated, and returned to the recovery room in satisfactory condition after tolerating the procedure well.

## 2016-06-12 NOTE — H&P (Signed)
Paper H&P to be scanned into permanent record. H&P reviewed. No changes. 

## 2016-06-12 NOTE — Anesthesia Postprocedure Evaluation (Signed)
Anesthesia Post Note  Patient: [name] Slipper Mullenax Jr.  Procedure(s) Performed: Procedure(s) (LRB): KNEE ARTHROSCOPY WITH PARTIAL MEDIAL MENISCAL REPAIR (Right)  Patient location during evaluation: PACU Anesthesia Type: General Level of consciousness: awake and alert Pain management: pain level controlled Vital Signs Assessment: post-procedure vital signs reviewed and stable Respiratory status: spontaneous breathing, nonlabored ventilation, respiratory function stable and patient connected to nasal cannula oxygen Cardiovascular status: blood pressure returned to baseline and stable Postop Assessment: no signs of nausea or vomiting Anesthetic complications: no    Last Vitals:  Vitals:   06/12/16 0945 06/12/16 1000  BP: 125/75 122/72  Pulse: 83 85  Resp: 16 16  Temp:      Last Pain:  Vitals:   06/12/16 0945  TempSrc:   PainSc: [place]

## 2016-06-12 NOTE — Anesthesia Preprocedure Evaluation (Addendum)
Anesthesia Evaluation  Patient identified by MRN, date of birth, ID band Patient awake    Reviewed: Allergy & Precautions, NPO status , Patient's Chart, lab work & pertinent test results, reviewed documented beta blocker date and time   Airway Mallampati: II  TM Distance: >3 FB     Dental  (+) Chipped   Pulmonary           Cardiovascular + DOE       Neuro/Psych    GI/Hepatic GERD  Controlled,  Endo/Other  diabetes, Type 2  Renal/GU Renal InsufficiencyRenal disease     Musculoskeletal  (+) Arthritis ,   Abdominal   Peds  (+) ventilator required Hematology   Anesthesia Other Findings Spinal stim for chronic pain. He has it shut off for surgery.  Reproductive/Obstetrics                           Anesthesia Physical Anesthesia Plan  ASA: II  Anesthesia Plan: General   Post-op Pain Management:    Induction: Intravenous  Airway Management Planned: LMA  Additional Equipment:   Intra-op Plan:   Post-operative Plan:   Informed Consent: I have reviewed the patients History and Physical, chart, labs and discussed the procedure including the risks, benefits and alternatives for the proposed anesthesia with the patient or authorized representative who has indicated his/her understanding and acceptance.     Plan Discussed with: CRNA  Anesthesia Plan Comments:         Anesthesia Quick Evaluation

## 2016-06-13 ENCOUNTER — Encounter: Payer: Self-pay | Admitting: Surgery

## 2016-06-14 DIAGNOSIS — M1711 Unilateral primary osteoarthritis, right knee: Secondary | ICD-10-CM | POA: Insufficient documentation

## 2016-12-26 ENCOUNTER — Ambulatory Visit
Admit: 2016-12-26 | Discharge: 2016-12-26 | Disposition: A | Discharge status: Home / Self Care | Payer: Medicare Other | Attending: Family Medicine | Admitting: Family Medicine

## 2016-12-26 ENCOUNTER — Ambulatory Visit
Admission: EM | Admit: 2016-12-26 | Discharge: 2016-12-26 | Disposition: A | Discharge status: Home / Self Care | Payer: Medicare Other | Attending: Family Medicine | Admitting: Family Medicine

## 2016-12-26 ENCOUNTER — Encounter: Payer: Self-pay | Admitting: Emergency Medicine

## 2016-12-26 DIAGNOSIS — R109 Unspecified abdominal pain: Secondary | ICD-10-CM | POA: Diagnosis present

## 2016-12-26 DIAGNOSIS — Z79899 Other long term (current) drug therapy: Secondary | ICD-10-CM | POA: Insufficient documentation

## 2016-12-26 DIAGNOSIS — N200 Calculus of kidney: Secondary | ICD-10-CM | POA: Diagnosis not present

## 2016-12-26 DIAGNOSIS — Z7984 Long term (current) use of oral hypoglycemic drugs: Secondary | ICD-10-CM | POA: Insufficient documentation

## 2016-12-26 LAB — URINALYSIS, COMPLETE (UACMP) WITH MICROSCOPIC
Bilirubin Urine: NEGATIVE
GLUCOSE, UA: 500 mg/dL — AB
Ketones, ur: NEGATIVE mg/dL
LEUKOCYTES UA: NEGATIVE
NITRITE: NEGATIVE
PROTEIN: NEGATIVE mg/dL
Specific Gravity, Urine: 1.015 (ref 1.005–1.030)
pH: 5.5 (ref 5.0–8.0)

## 2016-12-26 LAB — CBC WITH DIFFERENTIAL/PLATELET
BASOS ABS: 0.1 10*3/uL (ref 0–0.1)
Basophils Relative: 1 %
Eosinophils Absolute: 0.2 10*3/uL (ref 0–0.7)
Eosinophils Relative: 2 %
HEMATOCRIT: 47.1 % (ref 40.0–52.0)
HEMOGLOBIN: 16.3 g/dL (ref 13.0–18.0)
LYMPHS PCT: 24 %
Lymphs Abs: 2 10*3/uL (ref 1.0–3.6)
MCH: 32.3 pg (ref 26.0–34.0)
MCHC: 34.6 g/dL (ref 32.0–36.0)
MCV: 93.2 fL (ref 80.0–100.0)
Monocytes Absolute: 0.6 10*3/uL (ref 0.2–1.0)
Monocytes Relative: 7 %
NEUTROS ABS: 5.6 10*3/uL (ref 1.4–6.5)
NEUTROS PCT: 66 %
Platelets: 265 10*3/uL (ref 150–440)
RBC: 5.05 MIL/uL (ref 4.40–5.90)
RDW: 14 % (ref 11.5–14.5)
WBC: 8.4 10*3/uL (ref 3.8–10.6)

## 2016-12-26 LAB — BASIC METABOLIC PANEL
ANION GAP: 9 (ref 5–15)
BUN: 11 mg/dL (ref 6–20)
CO2: 27 mmol/L (ref 22–32)
Calcium: 8.9 mg/dL (ref 8.9–10.3)
Chloride: 101 mmol/L (ref 101–111)
Creatinine, Ser: 1.02 mg/dL (ref 0.61–1.24)
GFR calc non Af Amer: >60 mL/min (ref >60)
GLUCOSE: 161 mg/dL — AB (ref 65–99)
POTASSIUM: 3.8 mmol/L (ref 3.5–5.1)
Sodium: 137 mmol/L (ref 135–145)

## 2016-12-26 MED ORDER — TAMSULOSIN HCL 0.4 MG PO CAPS: 0.4000 mg | ORAL_CAPSULE | Freq: Every day | ORAL | 0 refills | Status: DC

## 2016-12-26 NOTE — ED Provider Notes (Signed)
[facility] ____________________________________________  Time seen: Approximately 7:24 PM  I have reviewed the triage vital signs and the nursing notes.   HISTORY  Chief Complaint Abdominal Pain (left side)   HPI [name] Brown [facility]. is a 46 y.o. male  presenting for the complaints of left-sided abdominal pain is in present for the last 3 days. Patient reports today he also had some sensation of urinary urgency with a slight burning discomfort with urination. Denies any other penile or testicular discomfort, penile or testicular pain, swelling, discharge or lesions. Denies trauma or injury. Denies known trigger. Reports has continued to eat and drink well. Denies nausea, vomiting, diarrhea or constipation. Patient states pain is mild at this time. Patient states pain is constant but has intermittent increases the pain. Denies aggravating factors.  Patient does report that he noticed his urine intermittently has appeared slightly darker and brownish coloration. Reports he does feel like he is able to completely empty bladder. Denies any rectal discomfort or perineal or rectal pressure. Denies any prostate issue history. Patient does report he has had multiple kidney stones in the past including 3 or 4 previous lithotripsies. Patient reports is been several years since he's had any imaging of ultrasound or CT regarding kidney stones. Patient also reports he has chronic orthopedic issues including chronic low back pain and what she has a spinal cord stimulator as well as on chronic hydromorphone and morphine tablets. Denies any acute change of back pain.  Denies chest pain, shortness of breath, abdominal pain, extremity pain, extremity swelling or rash. Denies recent sickness. Denies recent antibiotic use.   [facility], DALE E, MD: PCP Reports previously followed with urology in [facility].   Past Medical History:  Diagnosis Date  . Chronic kidney disease    H/O KIDNEY STONES  .  Chronic pain   . Diabetes mellitus without complication ([place])   . DJD (degenerative joint disease)   . GERD (gastroesophageal reflux disease)   . Hyperlipidemia   . Hypogonadism male     There are no active problems to display for this patient.   Past Surgical History:  Procedure Laterality Date  . KNEE ARTHROSCOPY    . KNEE ARTHROSCOPY WITH MENISCAL REPAIR Right 01/19/2016   Procedure: KNEE ARTHROSCOPY WITH MENISCAL REPAIR;  Surgeon: Corky Mull, MD;  Location: [facility] ORS;  Service: Orthopedics;  Laterality: Right;  . KNEE ARTHROSCOPY WITH MENISCAL REPAIR Right 06/12/2016   Procedure: KNEE ARTHROSCOPY WITH PARTIAL MEDIAL MENISCAL REPAIR;  Surgeon: Corky Mull, MD;  Location: [facility] ORS;  Service: Orthopedics;  Laterality: Right;  . PARATHYROIDECTOMY    . SHOULDER SURGERY Bilateral   . SPINAL CORD STIMULATOR BATTERY EXCHANGE    . SPINAL CORD STIMULATOR IMPLANT  2008  . SPINAL FUSION     L4-L5 (2004), L5-S1 (2006)  . THYROIDECTOMY, PARTIAL    . TONSILLECTOMY       No current facility-administered medications for this encounter.   Current Outpatient Prescriptions:  .  acyclovir (ZOVIRAX) 200 MG capsule, Take 200 mg by mouth as needed., Disp: , Rfl:  .  cholecalciferol (VITAMIN D) 1000 units tablet, Take 1,000 Units by mouth daily., Disp: , Rfl:  .  Flaxseed, Linseed, (FLAX SEEDS PO), Take by mouth daily., Disp: , Rfl:  .  HYDROmorphone (DILAUDID) 4 MG tablet, Take 4 mg by mouth every 4 (four) hours as needed for severe pain., Disp: , Rfl:  .  hydrOXYzine (ATARAX/VISTARIL) 10 MG tablet, Take 10 mg by mouth 3 (three) times  daily as needed., Disp: , Rfl:  .  lisinopril (PRINIVIL,ZESTRIL) 10 MG tablet, Take 10 mg by mouth every morning., Disp: , Rfl:  .  Magnesium 200 MG TABS, Take 1 tablet by mouth every morning. , Disp: , Rfl:  .  metFORMIN (GLUCOPHAGE) 500 MG tablet, Take 500 mg by mouth 2 (two) times daily with a meal., Disp: , Rfl:  .  morphine (MS CONTIN) 30 MG 12 hr tablet, Take  20 mg by mouth every 12 (twelve) hours., Disp: , Rfl:  .  Omega-3 Fatty Acids (FISH OIL PO), Take 1 tablet by mouth at bedtime. , Disp: , Rfl:  .  omeprazole (PRILOSEC) 20 MG capsule, Take 20 mg by mouth every other day., Disp: , Rfl:  .  tamsulosin (FLOMAX) 0.4 MG CAPS capsule, Take 1 capsule (0.4 mg total) by mouth daily., Disp: 7 capsule, Rfl: 0 .  testosterone cypionate (DEPOTESTOSTERONE CYPIONATE) 200 MG/ML injection, Inject 200 mg into the muscle every 7 (seven) days., Disp: , Rfl:  .  traZODone (DESYREL) 50 MG tablet, Take 12.5-25 mg by mouth at bedtime as needed for sleep. , Disp: , Rfl:   Allergies Pravastatin and Celexa [citalopram]  History reviewed. No pertinent family history.  Social History Social History  Substance Use Topics  . Smoking status: Never Smoker  . Smokeless tobacco: Never Used  . Alcohol use No    Review of Systems Constitutional: No fever/chills Eyes: No visual changes. ENT: No sore throat. Cardiovascular: Denies chest pain. Respiratory: Denies shortness of breath. Gastrointestinal: No abdominal pain.  No nausea, no vomiting.  No diarrhea.  No constipation.Denies hematochezia, melena or abnormal color stool.  Genitourinary:As above.  Musculoskeletal:Reports chronic back pain, denies acute changes.  Skin: Negative for rash. Neurological: Negative for headaches, focal weakness or numbness.  10-point ROS otherwise negative.  ____________________________________________   PHYSICAL EXAM:  VITAL SIGNS: ED Triage Vitals  Enc Vitals Group     BP 12/26/16 1831 128/79     Pulse Rate 12/26/16 1831 91     Resp 12/26/16 1831 16     Temp 12/26/16 1831 98.5 F (36.9 C)     Temp Source 12/26/16 1831 Oral     SpO2 12/26/16 1831 100 %     Weight 12/26/16 1830 164 lb (74.4 kg)     Height 12/26/16 1830 5\' 6"  (1.676 m)     Head Circumference --      Peak Flow --      Pain Score 12/26/16 1831 3     Pain Loc --      Pain Edu? --      Excl. in [place]? --      Constitutional: Alert and oriented. Well appearing and in no acute distress. Eyes: Conjunctivae are normal. PERRL. EOMI. ENT      Head: Normocephalic and atraumatic.      Mouth/Throat: Mucous membranes are moist. Cardiovascular: Normal rate, regular rhythm. Grossly normal heart sounds.  Good peripheral circulation. Respiratory: Normal respiratory effort without tachypnea nor retractions. Breath sounds are clear and equal bilaterally. No wheezes, rales, rhonchi. Gastrointestinal: Mild to moderate tenderness coming along left lateral flank to left suprapubic area, abdomen otherwise soft and nontender. Non-guarding. No distention. Normal Bowel sounds. No CVA tenderness. Rectal and prostate: patient declined.  Musculoskeletal:   No midline cervical, thoracic or lumbar tenderness to palpation.Steady gait. Spinal cord stimulator palpated the left paralumbar area. Neurologic:  Normal speech and language. Speech is normal. No gait instability.  Skin:  Skin is warm,  dry and intact. No rash noted. Psychiatric: Mood and affect are normal. Speech and behavior are normal. Patient exhibits appropriate insight and judgment   ___________________________________________   LABS (all labs ordered are listed, but only abnormal results are displayed)  Labs Reviewed  URINALYSIS, COMPLETE (UACMP) WITH MICROSCOPIC - Abnormal; Notable for the following:       Result Value   Glucose, UA 500 (*)    Hgb urine dipstick MODERATE (*)    Squamous Epithelial / LPF 0-5 (*)    Bacteria, UA RARE (*)    All other components within normal limits  BASIC METABOLIC PANEL - Abnormal; Notable for the following:    Glucose, Bld 161 (*)    All other components within normal limits  URINE CULTURE  CBC WITH DIFFERENTIAL/PLATELET   RADIOLOGY  Ct Renal Stone Study  Result Date: 12/26/2016 CLINICAL DATA:  New onset left lower pelvic pain beginning Monday. Burning with urination. Nausea vomiting. Personal history of kidney  stents with lithotripsy. EXAM: CT ABDOMEN AND PELVIS WITHOUT CONTRAST TECHNIQUE: Multidetector CT imaging of the abdomen and pelvis was performed following the standard protocol without IV contrast. COMPARISON:  None. FINDINGS: Lower chest: All lung bases are clear without focal nodule, mass, or airspace disease. Hepatobiliary: No focal liver abnormality is seen. No gallstones, gallbladder wall thickening, or biliary dilatation. Pancreas: Unremarkable. No pancreatic ductal dilatation or surrounding inflammatory changes. Spleen: Normal in size without focal abnormality. Adrenals/Urinary Tract: Adrenal glands are normal bilaterally. Two punctate nonobstructing stones are present in the right kidney. There are at least 3 punctate nonobstructing stones in the left kidney. There is slight stranding about the left ureter. An obstructing 4.0 stone is present at the left UVJ. The right ureter is unremarkable. The urinary bladder is otherwise within normal limits. Stomach/Bowel: The stomach and duodenum are within normal limits. The small bowel is unremarkable. Appendix is visualized and normal. The ascending and transverse colon are within normal limits. The descending and sigmoid colon are otherwise unremarkable. Vascular/Lymphatic: Mild vascular calcifications are present in the aorta and branch vessels without aneurysm Reproductive: Prostate is unremarkable. Other: No significant free fluid or free air is present. Musculoskeletal: Lumbar fusion is noted at L4-5 and L5-S1. Adjacent level disease is present at L3-4. No focal lytic or blastic lesions are present. IMPRESSION: 1. Partially obstructing 4 mm stone at the left UVJ without significant hydronephrosis. 2. Additional punctate nonobstructing stones in both kidneys. 3. Atherosclerosis. 4. Lumbar fusion. Electronically Signed   By: San Morelle M.D.   On: 12/26/2016 20:07   ____________________________________________   PROCEDURES Procedures    INITIAL  IMPRESSION / ASSESSMENT AND PLAN / ED COURSE  Pertinent labs & imaging results that were available during my care of the patient were reviewed by me and considered in my medical decision making (see chart for details).  Very well-appearing patient. No acute distress. Urinalysis rare bacteria, will culture. A urinalysis also showing RBCs as well as hemoglobin. With patient history, suspect stone. Patient reports that to his knowledge been able to pass a stone himself, and has had multiple Lithotripsies. Will evaluate BMP and CBC. Also evaluate CT renal stone study.  Laboratory studies reviewed and discussed with patient, unremarkable. CT renal partially of shunting 4 mm stone at the left UVJ without significant hydronephrosis, additional punctate nonobstructing stones in bilateral kidneys. Discussed results in detail with patient and spouse at bedside. Will defer Toradol at this time as stone is obstructing as well as previous history of lithotripsies. Continue home  pain medication and we'll additionally add Flomax. Follow-up with urology, information for other urology given. Encourage fluids and supportive care. Urine strainer given. Discussed strict follow-up and return parameters.  Discussed follow up with Primary care physician this week. Discussed follow up and return parameters including no resolution or any worsening concerns. Patient verbalized understanding and agreed to plan.   ____________________________________________   FINAL CLINICAL IMPRESSION(S) / ED DIAGNOSES  Final diagnoses:  Left nephrolithiasis     Discharge Medication List as of 12/26/2016  8:26 PM    START taking these medications   Details  tamsulosin (FLOMAX) 0.4 MG CAPS capsule Take 1 capsule (0.4 mg total) by mouth daily., Starting Wed 12/26/2016, Normal        Note: This dictation was prepared with Dragon dictation along with smaller phrase technology. Any transcriptional errors that result from this process are  unintentional.         Marylene Land, NP 12/26/16 2043

## 2016-12-26 NOTE — ED Triage Notes (Signed)
Patient c/o left sided lower back pain that started on Monday.  Patient reports some burning when urinating.  Patient denies N/V/D.

## 2016-12-26 NOTE — Discharge Instructions (Signed)
Take medication as prescribed. Rest. Drink plenty of fluids.   Follow up with your urologist this week.   Follow up with your primary care physician this week as needed. Return to Urgent care or Emergency room for new or worsening concerns.

## 2016-12-28 LAB — URINE CULTURE: Culture: NO GROWTH

## 2017-04-23 DIAGNOSIS — M6281 Muscle weakness (generalized): Secondary | ICD-10-CM | POA: Diagnosis not present

## 2017-04-23 DIAGNOSIS — M25561 Pain in right knee: Secondary | ICD-10-CM | POA: Diagnosis not present

## 2017-04-23 DIAGNOSIS — M25661 Stiffness of right knee, not elsewhere classified: Secondary | ICD-10-CM | POA: Diagnosis not present

## 2017-04-25 DIAGNOSIS — M25661 Stiffness of right knee, not elsewhere classified: Secondary | ICD-10-CM | POA: Diagnosis not present

## 2017-04-25 DIAGNOSIS — Z96651 Presence of right artificial knee joint: Secondary | ICD-10-CM | POA: Diagnosis not present

## 2017-04-25 DIAGNOSIS — M25561 Pain in right knee: Secondary | ICD-10-CM | POA: Diagnosis not present

## 2017-04-25 DIAGNOSIS — M6281 Muscle weakness (generalized): Secondary | ICD-10-CM | POA: Diagnosis not present

## 2017-04-30 DIAGNOSIS — M6281 Muscle weakness (generalized): Secondary | ICD-10-CM | POA: Diagnosis not present

## 2017-04-30 DIAGNOSIS — Z96651 Presence of right artificial knee joint: Secondary | ICD-10-CM | POA: Diagnosis not present

## 2017-04-30 DIAGNOSIS — M25661 Stiffness of right knee, not elsewhere classified: Secondary | ICD-10-CM | POA: Diagnosis not present

## 2017-04-30 DIAGNOSIS — M25561 Pain in right knee: Secondary | ICD-10-CM | POA: Diagnosis not present

## 2017-05-02 DIAGNOSIS — M6281 Muscle weakness (generalized): Secondary | ICD-10-CM | POA: Diagnosis not present

## 2017-05-02 DIAGNOSIS — Z96651 Presence of right artificial knee joint: Secondary | ICD-10-CM | POA: Diagnosis not present

## 2017-05-02 DIAGNOSIS — M25561 Pain in right knee: Secondary | ICD-10-CM | POA: Diagnosis not present

## 2017-05-02 DIAGNOSIS — M25661 Stiffness of right knee, not elsewhere classified: Secondary | ICD-10-CM | POA: Diagnosis not present

## 2017-05-07 DIAGNOSIS — M25561 Pain in right knee: Secondary | ICD-10-CM | POA: Diagnosis not present

## 2017-05-07 DIAGNOSIS — Z96651 Presence of right artificial knee joint: Secondary | ICD-10-CM | POA: Diagnosis not present

## 2017-05-07 DIAGNOSIS — M25661 Stiffness of right knee, not elsewhere classified: Secondary | ICD-10-CM | POA: Diagnosis not present

## 2017-05-07 DIAGNOSIS — M6281 Muscle weakness (generalized): Secondary | ICD-10-CM | POA: Diagnosis not present

## 2017-05-09 DIAGNOSIS — M25661 Stiffness of right knee, not elsewhere classified: Secondary | ICD-10-CM | POA: Diagnosis not present

## 2017-05-09 DIAGNOSIS — E291 Testicular hypofunction: Secondary | ICD-10-CM | POA: Diagnosis not present

## 2017-05-09 DIAGNOSIS — M6281 Muscle weakness (generalized): Secondary | ICD-10-CM | POA: Diagnosis not present

## 2017-05-09 DIAGNOSIS — Z96651 Presence of right artificial knee joint: Secondary | ICD-10-CM | POA: Diagnosis not present

## 2017-05-09 DIAGNOSIS — M25561 Pain in right knee: Secondary | ICD-10-CM | POA: Diagnosis not present

## 2017-05-09 DIAGNOSIS — G8929 Other chronic pain: Secondary | ICD-10-CM | POA: Diagnosis not present

## 2017-05-13 DIAGNOSIS — E291 Testicular hypofunction: Secondary | ICD-10-CM | POA: Diagnosis not present

## 2017-05-13 DIAGNOSIS — E119 Type 2 diabetes mellitus without complications: Secondary | ICD-10-CM | POA: Diagnosis not present

## 2017-05-13 DIAGNOSIS — M25661 Stiffness of right knee, not elsewhere classified: Secondary | ICD-10-CM | POA: Diagnosis not present

## 2017-05-13 DIAGNOSIS — M25561 Pain in right knee: Secondary | ICD-10-CM | POA: Diagnosis not present

## 2017-05-13 DIAGNOSIS — Z96651 Presence of right artificial knee joint: Secondary | ICD-10-CM | POA: Diagnosis not present

## 2017-05-13 DIAGNOSIS — M6281 Muscle weakness (generalized): Secondary | ICD-10-CM | POA: Diagnosis not present

## 2017-05-16 DIAGNOSIS — M25561 Pain in right knee: Secondary | ICD-10-CM | POA: Diagnosis not present

## 2017-05-16 DIAGNOSIS — M25661 Stiffness of right knee, not elsewhere classified: Secondary | ICD-10-CM | POA: Diagnosis not present

## 2017-05-16 DIAGNOSIS — Z96651 Presence of right artificial knee joint: Secondary | ICD-10-CM | POA: Diagnosis not present

## 2017-05-16 DIAGNOSIS — M6281 Muscle weakness (generalized): Secondary | ICD-10-CM | POA: Diagnosis not present

## 2017-05-20 DIAGNOSIS — M25561 Pain in right knee: Secondary | ICD-10-CM | POA: Diagnosis not present

## 2017-05-20 DIAGNOSIS — Z96651 Presence of right artificial knee joint: Secondary | ICD-10-CM | POA: Diagnosis not present

## 2017-05-20 DIAGNOSIS — M25661 Stiffness of right knee, not elsewhere classified: Secondary | ICD-10-CM | POA: Diagnosis not present

## 2017-05-20 DIAGNOSIS — M6281 Muscle weakness (generalized): Secondary | ICD-10-CM | POA: Diagnosis not present

## 2017-05-22 DIAGNOSIS — R69 Illness, unspecified: Secondary | ICD-10-CM | POA: Diagnosis not present

## 2017-05-22 DIAGNOSIS — Z96653 Presence of artificial knee joint, bilateral: Secondary | ICD-10-CM | POA: Diagnosis not present

## 2017-05-22 DIAGNOSIS — E119 Type 2 diabetes mellitus without complications: Secondary | ICD-10-CM | POA: Diagnosis not present

## 2017-05-22 DIAGNOSIS — E291 Testicular hypofunction: Secondary | ICD-10-CM | POA: Diagnosis not present

## 2017-05-23 DIAGNOSIS — M25661 Stiffness of right knee, not elsewhere classified: Secondary | ICD-10-CM | POA: Diagnosis not present

## 2017-05-23 DIAGNOSIS — M25561 Pain in right knee: Secondary | ICD-10-CM | POA: Diagnosis not present

## 2017-05-23 DIAGNOSIS — M6281 Muscle weakness (generalized): Secondary | ICD-10-CM | POA: Diagnosis not present

## 2017-05-23 DIAGNOSIS — Z96651 Presence of right artificial knee joint: Secondary | ICD-10-CM | POA: Diagnosis not present

## 2017-05-30 DIAGNOSIS — M25561 Pain in right knee: Secondary | ICD-10-CM | POA: Diagnosis not present

## 2017-05-30 DIAGNOSIS — Z96651 Presence of right artificial knee joint: Secondary | ICD-10-CM | POA: Diagnosis not present

## 2017-05-30 DIAGNOSIS — M25661 Stiffness of right knee, not elsewhere classified: Secondary | ICD-10-CM | POA: Diagnosis not present

## 2017-05-30 DIAGNOSIS — M6281 Muscle weakness (generalized): Secondary | ICD-10-CM | POA: Diagnosis not present

## 2017-06-03 DIAGNOSIS — E7800 Pure hypercholesterolemia, unspecified: Secondary | ICD-10-CM | POA: Diagnosis not present

## 2017-06-03 DIAGNOSIS — M6281 Muscle weakness (generalized): Secondary | ICD-10-CM | POA: Diagnosis not present

## 2017-06-03 DIAGNOSIS — E119 Type 2 diabetes mellitus without complications: Secondary | ICD-10-CM | POA: Diagnosis not present

## 2017-06-03 DIAGNOSIS — M2550 Pain in unspecified joint: Secondary | ICD-10-CM | POA: Diagnosis not present

## 2017-06-03 DIAGNOSIS — Z96651 Presence of right artificial knee joint: Secondary | ICD-10-CM | POA: Diagnosis not present

## 2017-06-03 DIAGNOSIS — M25661 Stiffness of right knee, not elsewhere classified: Secondary | ICD-10-CM | POA: Diagnosis not present

## 2017-06-03 DIAGNOSIS — K219 Gastro-esophageal reflux disease without esophagitis: Secondary | ICD-10-CM | POA: Diagnosis not present

## 2017-06-03 DIAGNOSIS — M25561 Pain in right knee: Secondary | ICD-10-CM | POA: Diagnosis not present

## 2017-06-06 DIAGNOSIS — M6281 Muscle weakness (generalized): Secondary | ICD-10-CM | POA: Diagnosis not present

## 2017-06-06 DIAGNOSIS — Z96651 Presence of right artificial knee joint: Secondary | ICD-10-CM | POA: Diagnosis not present

## 2017-06-06 DIAGNOSIS — M25561 Pain in right knee: Secondary | ICD-10-CM | POA: Diagnosis not present

## 2017-06-06 DIAGNOSIS — M25661 Stiffness of right knee, not elsewhere classified: Secondary | ICD-10-CM | POA: Diagnosis not present

## 2017-06-11 DIAGNOSIS — Z96651 Presence of right artificial knee joint: Secondary | ICD-10-CM | POA: Diagnosis not present

## 2017-06-11 DIAGNOSIS — M25561 Pain in right knee: Secondary | ICD-10-CM | POA: Diagnosis not present

## 2017-06-13 DIAGNOSIS — L738 Other specified follicular disorders: Secondary | ICD-10-CM | POA: Diagnosis not present

## 2017-06-14 DIAGNOSIS — M25561 Pain in right knee: Secondary | ICD-10-CM | POA: Diagnosis not present

## 2017-06-14 DIAGNOSIS — M2341 Loose body in knee, right knee: Secondary | ICD-10-CM | POA: Diagnosis not present

## 2017-06-20 DIAGNOSIS — M2341 Loose body in knee, right knee: Secondary | ICD-10-CM | POA: Diagnosis not present

## 2017-06-20 DIAGNOSIS — Z79891 Long term (current) use of opiate analgesic: Secondary | ICD-10-CM | POA: Diagnosis not present

## 2017-06-20 DIAGNOSIS — K219 Gastro-esophageal reflux disease without esophagitis: Secondary | ICD-10-CM | POA: Diagnosis not present

## 2017-06-20 DIAGNOSIS — Z888 Allergy status to other drugs, medicaments and biological substances status: Secondary | ICD-10-CM | POA: Diagnosis not present

## 2017-06-20 DIAGNOSIS — Z96651 Presence of right artificial knee joint: Secondary | ICD-10-CM | POA: Diagnosis not present

## 2017-06-20 DIAGNOSIS — Z792 Long term (current) use of antibiotics: Secondary | ICD-10-CM | POA: Diagnosis not present

## 2017-06-20 DIAGNOSIS — Z7984 Long term (current) use of oral hypoglycemic drugs: Secondary | ICD-10-CM | POA: Diagnosis not present

## 2017-06-20 DIAGNOSIS — E119 Type 2 diabetes mellitus without complications: Secondary | ICD-10-CM | POA: Diagnosis not present

## 2017-06-27 DIAGNOSIS — Z96651 Presence of right artificial knee joint: Secondary | ICD-10-CM | POA: Diagnosis not present

## 2017-06-27 DIAGNOSIS — M25561 Pain in right knee: Secondary | ICD-10-CM | POA: Diagnosis not present

## 2017-06-27 DIAGNOSIS — M25661 Stiffness of right knee, not elsewhere classified: Secondary | ICD-10-CM | POA: Diagnosis not present

## 2017-06-27 DIAGNOSIS — M6281 Muscle weakness (generalized): Secondary | ICD-10-CM | POA: Diagnosis not present

## 2017-07-01 DIAGNOSIS — M25561 Pain in right knee: Secondary | ICD-10-CM | POA: Diagnosis not present

## 2017-07-01 DIAGNOSIS — Z96651 Presence of right artificial knee joint: Secondary | ICD-10-CM | POA: Diagnosis not present

## 2017-07-01 DIAGNOSIS — M25661 Stiffness of right knee, not elsewhere classified: Secondary | ICD-10-CM | POA: Diagnosis not present

## 2017-07-01 DIAGNOSIS — M6281 Muscle weakness (generalized): Secondary | ICD-10-CM | POA: Diagnosis not present

## 2017-07-04 DIAGNOSIS — M25561 Pain in right knee: Secondary | ICD-10-CM | POA: Diagnosis not present

## 2017-07-04 DIAGNOSIS — Z96651 Presence of right artificial knee joint: Secondary | ICD-10-CM | POA: Diagnosis not present

## 2017-07-04 DIAGNOSIS — M25661 Stiffness of right knee, not elsewhere classified: Secondary | ICD-10-CM | POA: Diagnosis not present

## 2017-07-04 DIAGNOSIS — M6281 Muscle weakness (generalized): Secondary | ICD-10-CM | POA: Diagnosis not present

## 2017-07-10 DIAGNOSIS — M6281 Muscle weakness (generalized): Secondary | ICD-10-CM | POA: Diagnosis not present

## 2017-07-10 DIAGNOSIS — M25661 Stiffness of right knee, not elsewhere classified: Secondary | ICD-10-CM | POA: Diagnosis not present

## 2017-07-10 DIAGNOSIS — M25561 Pain in right knee: Secondary | ICD-10-CM | POA: Diagnosis not present

## 2017-07-10 DIAGNOSIS — Z96651 Presence of right artificial knee joint: Secondary | ICD-10-CM | POA: Diagnosis not present

## 2017-07-15 DIAGNOSIS — Z96651 Presence of right artificial knee joint: Secondary | ICD-10-CM | POA: Diagnosis not present

## 2017-07-15 DIAGNOSIS — M6281 Muscle weakness (generalized): Secondary | ICD-10-CM | POA: Diagnosis not present

## 2017-07-15 DIAGNOSIS — M25661 Stiffness of right knee, not elsewhere classified: Secondary | ICD-10-CM | POA: Diagnosis not present

## 2017-07-15 DIAGNOSIS — L738 Other specified follicular disorders: Secondary | ICD-10-CM | POA: Diagnosis not present

## 2017-07-15 DIAGNOSIS — M25561 Pain in right knee: Secondary | ICD-10-CM | POA: Diagnosis not present

## 2017-07-17 DIAGNOSIS — M7121 Synovial cyst of popliteal space [Baker], right knee: Secondary | ICD-10-CM | POA: Diagnosis not present

## 2017-07-18 DIAGNOSIS — G894 Chronic pain syndrome: Secondary | ICD-10-CM | POA: Diagnosis not present

## 2017-07-18 DIAGNOSIS — G8929 Other chronic pain: Secondary | ICD-10-CM | POA: Diagnosis not present

## 2017-07-18 DIAGNOSIS — M7121 Synovial cyst of popliteal space [Baker], right knee: Secondary | ICD-10-CM | POA: Diagnosis not present

## 2017-08-07 DIAGNOSIS — Z0000 Encounter for general adult medical examination without abnormal findings: Secondary | ICD-10-CM | POA: Diagnosis not present

## 2017-08-07 DIAGNOSIS — Z125 Encounter for screening for malignant neoplasm of prostate: Secondary | ICD-10-CM | POA: Diagnosis not present

## 2017-08-07 DIAGNOSIS — E119 Type 2 diabetes mellitus without complications: Secondary | ICD-10-CM | POA: Diagnosis not present

## 2017-08-08 DIAGNOSIS — M7121 Synovial cyst of popliteal space [Baker], right knee: Secondary | ICD-10-CM | POA: Diagnosis not present

## 2017-08-08 DIAGNOSIS — M25562 Pain in left knee: Secondary | ICD-10-CM | POA: Diagnosis not present

## 2017-08-28 DIAGNOSIS — M25561 Pain in right knee: Secondary | ICD-10-CM | POA: Diagnosis not present

## 2017-09-02 DIAGNOSIS — M25561 Pain in right knee: Secondary | ICD-10-CM | POA: Diagnosis not present

## 2017-09-17 DIAGNOSIS — G8929 Other chronic pain: Secondary | ICD-10-CM | POA: Diagnosis not present

## 2017-10-03 DIAGNOSIS — M2341 Loose body in knee, right knee: Secondary | ICD-10-CM | POA: Diagnosis not present

## 2017-10-03 DIAGNOSIS — G8918 Other acute postprocedural pain: Secondary | ICD-10-CM | POA: Diagnosis not present

## 2017-10-03 DIAGNOSIS — T84092A Other mechanical complication of internal right knee prosthesis, initial encounter: Secondary | ICD-10-CM | POA: Diagnosis not present

## 2017-10-03 DIAGNOSIS — M65861 Other synovitis and tenosynovitis, right lower leg: Secondary | ICD-10-CM | POA: Diagnosis not present

## 2017-10-03 DIAGNOSIS — Z96651 Presence of right artificial knee joint: Secondary | ICD-10-CM | POA: Diagnosis not present

## 2017-10-03 DIAGNOSIS — M25561 Pain in right knee: Secondary | ICD-10-CM | POA: Diagnosis not present

## 2017-10-03 DIAGNOSIS — M1711 Unilateral primary osteoarthritis, right knee: Secondary | ICD-10-CM | POA: Diagnosis not present

## 2017-10-03 DIAGNOSIS — M25761 Osteophyte, right knee: Secondary | ICD-10-CM | POA: Diagnosis not present

## 2017-10-25 DIAGNOSIS — M6281 Muscle weakness (generalized): Secondary | ICD-10-CM | POA: Diagnosis not present

## 2017-10-25 DIAGNOSIS — M25661 Stiffness of right knee, not elsewhere classified: Secondary | ICD-10-CM | POA: Diagnosis not present

## 2017-10-25 DIAGNOSIS — M25561 Pain in right knee: Secondary | ICD-10-CM | POA: Diagnosis not present

## 2017-10-28 DIAGNOSIS — M25561 Pain in right knee: Secondary | ICD-10-CM | POA: Diagnosis not present

## 2017-10-28 DIAGNOSIS — M25661 Stiffness of right knee, not elsewhere classified: Secondary | ICD-10-CM | POA: Diagnosis not present

## 2017-10-28 DIAGNOSIS — M6281 Muscle weakness (generalized): Secondary | ICD-10-CM | POA: Diagnosis not present

## 2017-10-28 DIAGNOSIS — Z96651 Presence of right artificial knee joint: Secondary | ICD-10-CM | POA: Diagnosis not present

## 2017-10-30 DIAGNOSIS — Z96651 Presence of right artificial knee joint: Secondary | ICD-10-CM | POA: Diagnosis not present

## 2017-10-30 DIAGNOSIS — M25661 Stiffness of right knee, not elsewhere classified: Secondary | ICD-10-CM | POA: Diagnosis not present

## 2017-10-30 DIAGNOSIS — M6281 Muscle weakness (generalized): Secondary | ICD-10-CM | POA: Diagnosis not present

## 2017-10-30 DIAGNOSIS — M25561 Pain in right knee: Secondary | ICD-10-CM | POA: Diagnosis not present

## 2017-11-04 DIAGNOSIS — M25661 Stiffness of right knee, not elsewhere classified: Secondary | ICD-10-CM | POA: Diagnosis not present

## 2017-11-04 DIAGNOSIS — Z96651 Presence of right artificial knee joint: Secondary | ICD-10-CM | POA: Diagnosis not present

## 2017-11-04 DIAGNOSIS — M25561 Pain in right knee: Secondary | ICD-10-CM | POA: Diagnosis not present

## 2017-11-04 DIAGNOSIS — M6281 Muscle weakness (generalized): Secondary | ICD-10-CM | POA: Diagnosis not present

## 2017-11-06 DIAGNOSIS — M25561 Pain in right knee: Secondary | ICD-10-CM | POA: Diagnosis not present

## 2017-11-06 DIAGNOSIS — M545 Low back pain: Secondary | ICD-10-CM | POA: Diagnosis not present

## 2017-11-07 DIAGNOSIS — M25661 Stiffness of right knee, not elsewhere classified: Secondary | ICD-10-CM | POA: Diagnosis not present

## 2017-11-07 DIAGNOSIS — Z96651 Presence of right artificial knee joint: Secondary | ICD-10-CM | POA: Diagnosis not present

## 2017-11-07 DIAGNOSIS — M25561 Pain in right knee: Secondary | ICD-10-CM | POA: Diagnosis not present

## 2017-11-07 DIAGNOSIS — M6281 Muscle weakness (generalized): Secondary | ICD-10-CM | POA: Diagnosis not present

## 2017-11-11 DIAGNOSIS — Z96653 Presence of artificial knee joint, bilateral: Secondary | ICD-10-CM | POA: Diagnosis not present

## 2017-11-13 DIAGNOSIS — R69 Illness, unspecified: Secondary | ICD-10-CM | POA: Diagnosis not present

## 2017-11-13 DIAGNOSIS — Z96653 Presence of artificial knee joint, bilateral: Secondary | ICD-10-CM | POA: Diagnosis not present

## 2017-11-14 DIAGNOSIS — M25661 Stiffness of right knee, not elsewhere classified: Secondary | ICD-10-CM | POA: Diagnosis not present

## 2017-11-14 DIAGNOSIS — M6281 Muscle weakness (generalized): Secondary | ICD-10-CM | POA: Diagnosis not present

## 2017-11-14 DIAGNOSIS — Z96651 Presence of right artificial knee joint: Secondary | ICD-10-CM | POA: Diagnosis not present

## 2017-11-14 DIAGNOSIS — M25561 Pain in right knee: Secondary | ICD-10-CM | POA: Diagnosis not present

## 2017-11-19 DIAGNOSIS — M6281 Muscle weakness (generalized): Secondary | ICD-10-CM | POA: Diagnosis not present

## 2017-11-19 DIAGNOSIS — M25561 Pain in right knee: Secondary | ICD-10-CM | POA: Diagnosis not present

## 2017-11-19 DIAGNOSIS — Z96651 Presence of right artificial knee joint: Secondary | ICD-10-CM | POA: Diagnosis not present

## 2017-11-19 DIAGNOSIS — M25661 Stiffness of right knee, not elsewhere classified: Secondary | ICD-10-CM | POA: Diagnosis not present

## 2017-11-21 DIAGNOSIS — E291 Testicular hypofunction: Secondary | ICD-10-CM | POA: Diagnosis not present

## 2017-11-21 DIAGNOSIS — E7800 Pure hypercholesterolemia, unspecified: Secondary | ICD-10-CM | POA: Diagnosis not present

## 2017-11-21 DIAGNOSIS — L738 Other specified follicular disorders: Secondary | ICD-10-CM | POA: Diagnosis not present

## 2017-11-25 DIAGNOSIS — E119 Type 2 diabetes mellitus without complications: Secondary | ICD-10-CM | POA: Diagnosis not present

## 2017-11-25 DIAGNOSIS — E291 Testicular hypofunction: Secondary | ICD-10-CM | POA: Diagnosis not present

## 2017-11-27 DIAGNOSIS — M6281 Muscle weakness (generalized): Secondary | ICD-10-CM | POA: Diagnosis not present

## 2017-11-27 DIAGNOSIS — Z96651 Presence of right artificial knee joint: Secondary | ICD-10-CM | POA: Diagnosis not present

## 2017-11-27 DIAGNOSIS — M25661 Stiffness of right knee, not elsewhere classified: Secondary | ICD-10-CM | POA: Diagnosis not present

## 2017-11-27 DIAGNOSIS — M25561 Pain in right knee: Secondary | ICD-10-CM | POA: Diagnosis not present

## 2017-11-28 DIAGNOSIS — R200 Anesthesia of skin: Secondary | ICD-10-CM | POA: Diagnosis not present

## 2017-11-28 DIAGNOSIS — G44229 Chronic tension-type headache, not intractable: Secondary | ICD-10-CM | POA: Insufficient documentation

## 2017-11-29 DIAGNOSIS — Z96651 Presence of right artificial knee joint: Secondary | ICD-10-CM | POA: Diagnosis not present

## 2017-11-29 DIAGNOSIS — M25561 Pain in right knee: Secondary | ICD-10-CM | POA: Diagnosis not present

## 2017-11-29 DIAGNOSIS — M25661 Stiffness of right knee, not elsewhere classified: Secondary | ICD-10-CM | POA: Diagnosis not present

## 2017-11-29 DIAGNOSIS — M6281 Muscle weakness (generalized): Secondary | ICD-10-CM | POA: Diagnosis not present

## 2017-12-04 DIAGNOSIS — M25661 Stiffness of right knee, not elsewhere classified: Secondary | ICD-10-CM | POA: Diagnosis not present

## 2017-12-04 DIAGNOSIS — Z96651 Presence of right artificial knee joint: Secondary | ICD-10-CM | POA: Diagnosis not present

## 2017-12-04 DIAGNOSIS — M2550 Pain in unspecified joint: Secondary | ICD-10-CM | POA: Diagnosis not present

## 2017-12-04 DIAGNOSIS — M25561 Pain in right knee: Secondary | ICD-10-CM | POA: Diagnosis not present

## 2017-12-04 DIAGNOSIS — M6281 Muscle weakness (generalized): Secondary | ICD-10-CM | POA: Diagnosis not present

## 2017-12-04 DIAGNOSIS — E7800 Pure hypercholesterolemia, unspecified: Secondary | ICD-10-CM | POA: Diagnosis not present

## 2017-12-04 DIAGNOSIS — E119 Type 2 diabetes mellitus without complications: Secondary | ICD-10-CM | POA: Diagnosis not present

## 2017-12-04 DIAGNOSIS — K219 Gastro-esophageal reflux disease without esophagitis: Secondary | ICD-10-CM | POA: Diagnosis not present

## 2017-12-05 DIAGNOSIS — E119 Type 2 diabetes mellitus without complications: Secondary | ICD-10-CM | POA: Diagnosis not present

## 2017-12-09 DIAGNOSIS — Z96651 Presence of right artificial knee joint: Secondary | ICD-10-CM | POA: Diagnosis not present

## 2017-12-09 DIAGNOSIS — M25561 Pain in right knee: Secondary | ICD-10-CM | POA: Diagnosis not present

## 2017-12-09 DIAGNOSIS — M25661 Stiffness of right knee, not elsewhere classified: Secondary | ICD-10-CM | POA: Diagnosis not present

## 2017-12-09 DIAGNOSIS — M6281 Muscle weakness (generalized): Secondary | ICD-10-CM | POA: Diagnosis not present

## 2017-12-10 DIAGNOSIS — R51 Headache: Secondary | ICD-10-CM | POA: Diagnosis not present

## 2017-12-12 DIAGNOSIS — M25561 Pain in right knee: Secondary | ICD-10-CM | POA: Diagnosis not present

## 2017-12-12 DIAGNOSIS — M25661 Stiffness of right knee, not elsewhere classified: Secondary | ICD-10-CM | POA: Diagnosis not present

## 2017-12-12 DIAGNOSIS — Z96651 Presence of right artificial knee joint: Secondary | ICD-10-CM | POA: Diagnosis not present

## 2017-12-12 DIAGNOSIS — G8929 Other chronic pain: Secondary | ICD-10-CM | POA: Diagnosis not present

## 2017-12-12 DIAGNOSIS — M6281 Muscle weakness (generalized): Secondary | ICD-10-CM | POA: Diagnosis not present

## 2017-12-16 DIAGNOSIS — Z96651 Presence of right artificial knee joint: Secondary | ICD-10-CM | POA: Diagnosis not present

## 2017-12-16 DIAGNOSIS — M25561 Pain in right knee: Secondary | ICD-10-CM | POA: Diagnosis not present

## 2017-12-16 DIAGNOSIS — M6281 Muscle weakness (generalized): Secondary | ICD-10-CM | POA: Diagnosis not present

## 2017-12-16 DIAGNOSIS — M25661 Stiffness of right knee, not elsewhere classified: Secondary | ICD-10-CM | POA: Diagnosis not present

## 2017-12-19 DIAGNOSIS — M25561 Pain in right knee: Secondary | ICD-10-CM | POA: Diagnosis not present

## 2017-12-19 DIAGNOSIS — M25661 Stiffness of right knee, not elsewhere classified: Secondary | ICD-10-CM | POA: Diagnosis not present

## 2017-12-19 DIAGNOSIS — Z96651 Presence of right artificial knee joint: Secondary | ICD-10-CM | POA: Diagnosis not present

## 2017-12-19 DIAGNOSIS — M6281 Muscle weakness (generalized): Secondary | ICD-10-CM | POA: Diagnosis not present

## 2018-01-08 DIAGNOSIS — R69 Illness, unspecified: Secondary | ICD-10-CM | POA: Diagnosis not present

## 2018-01-08 DIAGNOSIS — L700 Acne vulgaris: Secondary | ICD-10-CM | POA: Diagnosis not present

## 2018-01-08 DIAGNOSIS — Z79899 Other long term (current) drug therapy: Secondary | ICD-10-CM | POA: Diagnosis not present

## 2018-01-29 DIAGNOSIS — E119 Type 2 diabetes mellitus without complications: Secondary | ICD-10-CM | POA: Diagnosis not present

## 2018-01-29 DIAGNOSIS — E291 Testicular hypofunction: Secondary | ICD-10-CM | POA: Diagnosis not present

## 2018-01-29 DIAGNOSIS — E1159 Type 2 diabetes mellitus with other circulatory complications: Secondary | ICD-10-CM | POA: Diagnosis not present

## 2018-01-29 DIAGNOSIS — E221 Hyperprolactinemia: Secondary | ICD-10-CM | POA: Insufficient documentation

## 2018-01-29 DIAGNOSIS — E1169 Type 2 diabetes mellitus with other specified complication: Secondary | ICD-10-CM | POA: Diagnosis not present

## 2018-01-30 DIAGNOSIS — G44229 Chronic tension-type headache, not intractable: Secondary | ICD-10-CM | POA: Diagnosis not present

## 2018-01-30 DIAGNOSIS — R200 Anesthesia of skin: Secondary | ICD-10-CM | POA: Diagnosis not present

## 2018-02-05 DIAGNOSIS — L700 Acne vulgaris: Secondary | ICD-10-CM | POA: Diagnosis not present

## 2018-02-05 DIAGNOSIS — Z79899 Other long term (current) drug therapy: Secondary | ICD-10-CM | POA: Diagnosis not present

## 2018-02-05 DIAGNOSIS — Z96653 Presence of artificial knee joint, bilateral: Secondary | ICD-10-CM | POA: Diagnosis not present

## 2018-02-19 DIAGNOSIS — M47812 Spondylosis without myelopathy or radiculopathy, cervical region: Secondary | ICD-10-CM | POA: Diagnosis not present

## 2018-02-21 DIAGNOSIS — E119 Type 2 diabetes mellitus without complications: Secondary | ICD-10-CM | POA: Diagnosis not present

## 2018-03-05 ENCOUNTER — Other Ambulatory Visit: Payer: Self-pay | Admitting: Acute Care

## 2018-03-05 DIAGNOSIS — G44229 Chronic tension-type headache, not intractable: Secondary | ICD-10-CM

## 2018-03-05 DIAGNOSIS — R51 Headache: Principal | ICD-10-CM

## 2018-03-05 DIAGNOSIS — R519 Headache, unspecified: Secondary | ICD-10-CM

## 2018-03-07 ENCOUNTER — Ambulatory Visit: Payer: Medicare Other

## 2018-03-10 ENCOUNTER — Ambulatory Visit
Admission: RE | Admit: 2018-03-10 | Discharge: 2018-03-10 | Disposition: A | Discharge status: Home / Self Care | Payer: Medicare Other | Source: Ambulatory Visit | Attending: Acute Care | Admitting: Acute Care

## 2018-03-10 DIAGNOSIS — G44229 Chronic tension-type headache, not intractable: Secondary | ICD-10-CM

## 2018-03-10 DIAGNOSIS — R51 Headache: Secondary | ICD-10-CM

## 2018-03-10 DIAGNOSIS — L700 Acne vulgaris: Secondary | ICD-10-CM | POA: Diagnosis not present

## 2018-03-10 DIAGNOSIS — G44221 Chronic tension-type headache, intractable: Secondary | ICD-10-CM | POA: Diagnosis not present

## 2018-03-10 DIAGNOSIS — R519 Headache, unspecified: Secondary | ICD-10-CM

## 2018-03-10 DIAGNOSIS — L814 Other melanin hyperpigmentation: Secondary | ICD-10-CM | POA: Diagnosis not present

## 2018-03-10 DIAGNOSIS — Z79899 Other long term (current) drug therapy: Secondary | ICD-10-CM | POA: Diagnosis not present

## 2018-03-11 DIAGNOSIS — G8929 Other chronic pain: Secondary | ICD-10-CM | POA: Diagnosis not present

## 2018-03-11 DIAGNOSIS — Z79899 Other long term (current) drug therapy: Secondary | ICD-10-CM | POA: Diagnosis not present

## 2018-03-11 DIAGNOSIS — M545 Low back pain: Secondary | ICD-10-CM | POA: Diagnosis not present

## 2018-03-12 ENCOUNTER — Other Ambulatory Visit: Payer: Self-pay | Admitting: Acute Care

## 2018-03-12 DIAGNOSIS — G932 Benign intracranial hypertension: Secondary | ICD-10-CM

## 2018-03-20 ENCOUNTER — Ambulatory Visit
Admission: RE | Admit: 2018-03-20 | Discharge: 2018-03-20 | Disposition: A | Discharge status: Home / Self Care | Payer: Medicare Other | Source: Ambulatory Visit | Attending: Acute Care | Admitting: Acute Care

## 2018-03-20 DIAGNOSIS — G932 Benign intracranial hypertension: Secondary | ICD-10-CM | POA: Insufficient documentation

## 2018-03-20 MED ORDER — ACETAMINOPHEN 325 MG PO TABS
650.0000 mg | ORAL_TABLET | ORAL | Status: DC | PRN
  Filled 2018-03-20: qty 2

## 2018-03-20 MED ORDER — LIDOCAINE HCL (PF) 1 % IJ SOLN
5.0000 mL | Freq: Once | INTRAMUSCULAR | Status: AC
  Administered 2018-03-20: 5 mL
  Filled 2018-03-20: qty 5

## 2018-03-20 NOTE — Discharge Instructions (Signed)
Lumbar Puncture, Care After °Refer to this sheet in the next few weeks. These instructions provide you with information on caring for yourself after your procedure. Your health care provider may also give you more specific instructions. Your treatment has been planned according to current medical practices, but problems sometimes occur. Call your health care provider if you have any problems or questions after your procedure. °What can I expect after the procedure? °After your procedure, it is typical to have the following sensations: °· Mild discomfort or pain at the insertion site. °· Mild headache that is relieved with pain medicines. ° °Follow these instructions at home: ° °· Avoid lifting anything heavier than 10 lb (4.5 kg) for at least 12 hours after the procedure. °· Drink enough fluids to keep your urine clear or pale yellow. °Contact a health care provider if: °· You have fever or chills. °· You have nausea or vomiting. °· You have a headache that lasts for more than 2 days. °Get help right away if: °· You have any numbness or tingling in your legs. °· You are unable to control your bowel or bladder. °· You have bleeding or swelling in your back at the insertion site. °· You are dizzy or faint. °This information is not intended to replace advice given to you by your health care provider. Make sure you discuss any questions you have with your health care provider. °Document Released: 10/13/2013 Document Revised: 03/15/2016 Document Reviewed: 06/16/2013 °Elsevier Interactive Patient Education © 2017 Elsevier Inc. ° °

## 2018-03-20 NOTE — Progress Notes (Signed)
Pt. Stable after L.P.VSS.Back stable.D/C instructions given.F/U with M.D.

## 2018-03-20 NOTE — Progress Notes (Signed)
Seen by Dr. Register-cleared for discharge at 1340.

## 2018-04-04 DIAGNOSIS — Z79899 Other long term (current) drug therapy: Secondary | ICD-10-CM | POA: Diagnosis not present

## 2018-04-04 DIAGNOSIS — L700 Acne vulgaris: Secondary | ICD-10-CM | POA: Diagnosis not present

## 2018-04-21 ENCOUNTER — Encounter: Payer: Self-pay | Admitting: Emergency Medicine

## 2018-04-21 ENCOUNTER — Ambulatory Visit
Admission: EM | Admit: 2018-04-21 | Discharge: 2018-04-21 | Disposition: A | Discharge status: Home / Self Care | Payer: Medicare Other | Attending: Family Medicine | Admitting: Family Medicine

## 2018-04-21 ENCOUNTER — Other Ambulatory Visit: Payer: Self-pay

## 2018-04-21 DIAGNOSIS — J0100 Acute maxillary sinusitis, unspecified: Secondary | ICD-10-CM | POA: Diagnosis not present

## 2018-04-21 MED ORDER — AMOXICILLIN-POT CLAVULANATE 875-125 MG PO TABS: 1.0000 | ORAL_TABLET | Freq: Two times a day (BID) | ORAL | 0 refills | Status: AC

## 2018-04-21 NOTE — ED Triage Notes (Signed)
Patient c/o sinus congestion and pressure, HAs and cough for a week.  Patient denies fevers.

## 2018-04-21 NOTE — Discharge Instructions (Addendum)
Take medication as prescribed. Rest. Drink plenty of fluids.  ° °Follow up with your primary care physician this week as needed. Return to Urgent care for new or worsening concerns.  ° °

## 2018-04-21 NOTE — ED Provider Notes (Signed)
[facility] ____________________________________________  Time seen: Approximately 1:35 PM  I have reviewed the triage vital signs and the nursing notes.   HISTORY  Chief Complaint Cough and Sinus Problem   HPI [name] Martelli [facility] is a 47 y.o. male presenting for evaluation of 1.5 weeks of congestion and drainage complaints.  States his initial sickness onset he was having more chest congestion and coughing, but reports symptoms then quickly because nasal congestion and sinus pressure.  Patient reports not getting a lot of nasal drainage out, but states feels clogged with thick stringy nasal drainage intermittently.  States cough is improved but still intermittently present.  Denies sore throat or fevers.  No known sick contacts.  Continues to overall eat and drink well.  Has taken some over-the-counter expectorants and cough decongestants without much change.  Denies other aggravating or alleviating factors.  Reports otherwise feels well.  Denies chest pain, shortness of breath, abdominal pain,or rash. Denies recent sickness. Denies recent antibiotic use.   Sofie Hartigan, MD: PCP   Past Medical History:  Diagnosis Date  . Chronic kidney disease    H/O KIDNEY STONES  . Chronic pain   . Diabetes mellitus without complication ([place])   . DJD (degenerative joint disease)   . GERD (gastroesophageal reflux disease)   . Hyperlipidemia   . Hypogonadism male     There are no active problems to display for this patient.   Past Surgical History:  Procedure Laterality Date  . KNEE ARTHROSCOPY    . KNEE ARTHROSCOPY WITH MENISCAL REPAIR Right 01/19/2016   Procedure: KNEE ARTHROSCOPY WITH MENISCAL REPAIR;  Surgeon: Corky Mull, MD;  Location: [facility] ORS;  Service: Orthopedics;  Laterality: Right;  . KNEE ARTHROSCOPY WITH MENISCAL REPAIR Right 06/12/2016   Procedure: KNEE ARTHROSCOPY WITH PARTIAL MEDIAL MENISCAL REPAIR;  Surgeon: Corky Mull, MD;  Location: [facility] ORS;   Service: Orthopedics;  Laterality: Right;  . PARATHYROIDECTOMY    . SHOULDER SURGERY Bilateral   . SPINAL CORD STIMULATOR BATTERY EXCHANGE    . SPINAL CORD STIMULATOR IMPLANT  2008  . SPINAL FUSION     L4-L5 (2004), L5-S1 (2006)  . THYROIDECTOMY, PARTIAL    . TONSILLECTOMY       No current facility-administered medications for this encounter.   Current Outpatient Medications:  .  cholecalciferol (VITAMIN D) 1000 units tablet, Take 1,000 Units by mouth daily., Disp: , Rfl:  .  lisinopril (PRINIVIL,ZESTRIL) 10 MG tablet, Take 10 mg by mouth every morning., Disp: , Rfl:  .  Magnesium 200 MG TABS, Take 1 tablet by mouth every morning. , Disp: , Rfl:  .  metFORMIN (GLUCOPHAGE) 500 MG tablet, Take 500 mg by mouth 2 (two) times daily with a meal., Disp: , Rfl:  .  Omega-3 Fatty Acids (FISH OIL PO), Take 1 tablet by mouth at bedtime. , Disp: , Rfl:  .  oxyCODONE (OXYCONTIN) 10 mg 12 hr tablet, Take 10 mg by mouth every 12 (twelve) hours., Disp: , Rfl:  .  pantoprazole (PROTONIX) 20 MG tablet, Take 20 mg by mouth daily., Disp: , Rfl:  .  traZODone (DESYREL) 50 MG tablet, Take 12.5-25 mg by mouth at bedtime as needed for sleep. , Disp: , Rfl:  .  acyclovir (ZOVIRAX) 200 MG capsule, Take 200 mg by mouth as needed., Disp: , Rfl:  .  amoxicillin-clavulanate (AUGMENTIN) 875-125 MG tablet, Take 1 tablet by mouth every 12 (twelve) hours for 10 days., Disp: 20 tablet, Rfl: 0 .  HYDROmorphone (DILAUDID) 4 MG tablet, Take 4 mg by mouth every 4 (four) hours as needed for severe pain., Disp: , Rfl:  .  hydrOXYzine (ATARAX/VISTARIL) 10 MG tablet, Take 10 mg by mouth 3 (three) times daily as needed., Disp: , Rfl:  .  tamsulosin (FLOMAX) 0.4 MG CAPS capsule, Take 1 capsule (0.4 mg total) by mouth daily., Disp: 7 capsule, Rfl: 0 .  testosterone cypionate (DEPOTESTOSTERONE CYPIONATE) 200 MG/ML injection, Inject 200 mg into the muscle every 7 (seven) days., Disp: , Rfl:   Allergies Pravastatin and Celexa  [citalopram]  History reviewed. No pertinent family history.  Social History Social History   Tobacco Use  . Smoking status: Never Smoker  . Smokeless tobacco: Never Used  Substance Use Topics  . Alcohol use: No  . Drug use: No    Review of Systems Constitutional: No fever Cardiovascular: Denies chest pain. Respiratory: Denies shortness of breath. Gastrointestinal: No abdominal pain.   Skin: Negative for rash.    ____________________________________________   PHYSICAL EXAM:  VITAL SIGNS: ED Triage Vitals  Enc Vitals Group     BP 04/21/18 1200 123/83     Pulse Rate 04/21/18 1200 86     Resp 04/21/18 1200 16     Temp 04/21/18 1200 98.1 F (36.7 C)     Temp Source 04/21/18 1200 Oral     SpO2 04/21/18 1200 100 %     Weight 04/21/18 1155 163 lb (73.9 kg)     Height 04/21/18 1155 5\' 6"  (1.676 m)     Head Circumference --      Peak Flow --      Pain Score 04/21/18 1155 2     Pain Loc --      Pain Edu? --      Excl. in [place]? --     Constitutional: Alert and oriented. Well appearing and in no acute distress. Head: Atraumatic.Mild tenderness to palpation bilateral maxillary sinuses. No frontal sinus tenderness.  No swelling. No erythema.   Ears: no erythema, normal TMs bilaterally.   Nose: nasal congestion with bilateral nasal turbinate erythema and edema.   Mouth/Throat: Mucous membranes are moist.  Oropharynx non-erythematous.No tonsillar swelling or exudate.  Neck: No stridor.  No cervical spine tenderness to palpation. Hematological/Lymphatic/Immunilogical: No cervical lymphadenopathy. Cardiovascular: Normal rate, regular rhythm. Grossly normal heart sounds.  Good peripheral circulation. Respiratory: Normal respiratory effort.  No retractions. Lungs CTAB. No wheezes, rales or rhonchi. Good air movement.  Musculoskeletal:Steady gait.  Neurologic:  Normal speech and language.No gait instability. Skin:  Skin is warm, dry Psychiatric: Mood and affect are normal. Speech  and behavior are normal.  ___________________________________________   LABS (all labs ordered are listed, but only abnormal results are displayed)  Labs Reviewed - No data to display ____________________________________________  PROCEDURES Procedures    INITIAL IMPRESSION / ASSESSMENT AND PLAN / ED COURSE  Pertinent labs & imaging results that were available during my care of the patient were reviewed by me and considered in my medical decision making (see chart for details).  Well-appearing patient.  No acute distress.  Suspect recent viral illness with secondary maxillary sinusitis.  Will treat with oral Augmentin.  Encourage rest, fluids, supportive care, over-the-counter decongestants.Discussed indication, risks and benefits of medications with patient.  Discussed follow up with Primary care physician this week. Discussed follow up and return parameters including no resolution or any worsening concerns. Patient verbalized understanding and agreed to plan.   ____________________________________________   FINAL CLINICAL IMPRESSION(S) / ED DIAGNOSES  Final diagnoses:  Acute maxillary sinusitis, recurrence not specified     ED Discharge Orders        Ordered    amoxicillin-clavulanate (AUGMENTIN) 875-125 MG tablet  Every 12 hours     04/21/18 1240       Note: This dictation was prepared with Dragon dictation along with smaller phrase technology. Any transcriptional errors that result from this process are unintentional.         [name] Land, NP 04/21/18 1401

## 2018-05-01 DIAGNOSIS — R200 Anesthesia of skin: Secondary | ICD-10-CM | POA: Diagnosis not present

## 2018-05-01 DIAGNOSIS — G44229 Chronic tension-type headache, not intractable: Secondary | ICD-10-CM | POA: Diagnosis not present

## 2018-05-20 ENCOUNTER — Other Ambulatory Visit (HOSPITAL_COMMUNITY): Payer: Self-pay | Admitting: Acute Care

## 2018-05-20 ENCOUNTER — Other Ambulatory Visit: Payer: Self-pay | Admitting: Acute Care

## 2018-05-20 DIAGNOSIS — R519 Headache, unspecified: Secondary | ICD-10-CM

## 2018-05-20 DIAGNOSIS — R51 Headache: Principal | ICD-10-CM

## 2018-05-21 ENCOUNTER — Other Ambulatory Visit (HOSPITAL_COMMUNITY): Payer: Self-pay | Admitting: Acute Care

## 2018-05-21 ENCOUNTER — Encounter (HOSPITAL_COMMUNITY): Payer: Self-pay

## 2018-05-21 DIAGNOSIS — Z96653 Presence of artificial knee joint, bilateral: Secondary | ICD-10-CM | POA: Diagnosis not present

## 2018-05-21 DIAGNOSIS — M25569 Pain in unspecified knee: Secondary | ICD-10-CM | POA: Diagnosis not present

## 2018-05-21 DIAGNOSIS — Z0189 Encounter for other specified special examinations: Secondary | ICD-10-CM

## 2018-05-21 DIAGNOSIS — Z1389 Encounter for screening for other disorder: Secondary | ICD-10-CM

## 2018-05-23 ENCOUNTER — Ambulatory Visit (HOSPITAL_COMMUNITY)
Admission: RE | Admit: 2018-05-23 | Discharge: 2018-05-23 | Disposition: A | Discharge status: Home / Self Care | Payer: Medicare Other | Source: Ambulatory Visit | Attending: Acute Care | Admitting: Acute Care

## 2018-05-23 ENCOUNTER — Ambulatory Visit (HOSPITAL_COMMUNITY): Admission: RE | Admit: 2018-05-23 | Payer: Medicare Other | Source: Ambulatory Visit

## 2018-05-23 DIAGNOSIS — Z1389 Encounter for screening for other disorder: Secondary | ICD-10-CM

## 2018-05-23 DIAGNOSIS — R51 Headache: Secondary | ICD-10-CM | POA: Diagnosis not present

## 2018-05-23 DIAGNOSIS — R519 Headache, unspecified: Secondary | ICD-10-CM

## 2018-05-23 DIAGNOSIS — Z0189 Encounter for other specified special examinations: Secondary | ICD-10-CM

## 2018-05-23 MED ORDER — GADOBENATE DIMEGLUMINE 529 MG/ML IV SOLN
15.0000 mL | Freq: Once | INTRAVENOUS | Status: AC | PRN
  Administered 2018-05-23: 15 mL via INTRAVENOUS

## 2018-05-30 ENCOUNTER — Ambulatory Visit: Payer: Medicare Other

## 2018-06-09 ENCOUNTER — Other Ambulatory Visit: Payer: Self-pay | Admitting: Family Medicine

## 2018-06-09 DIAGNOSIS — N200 Calculus of kidney: Secondary | ICD-10-CM | POA: Diagnosis not present

## 2018-06-09 DIAGNOSIS — R1012 Left upper quadrant pain: Secondary | ICD-10-CM | POA: Diagnosis not present

## 2018-06-09 DIAGNOSIS — R109 Unspecified abdominal pain: Secondary | ICD-10-CM

## 2018-06-09 DIAGNOSIS — R1084 Generalized abdominal pain: Secondary | ICD-10-CM | POA: Diagnosis not present

## 2018-06-10 ENCOUNTER — Other Ambulatory Visit: Payer: Self-pay | Admitting: Family Medicine

## 2018-06-10 DIAGNOSIS — R109 Unspecified abdominal pain: Secondary | ICD-10-CM

## 2018-06-10 DIAGNOSIS — N200 Calculus of kidney: Secondary | ICD-10-CM

## 2018-06-12 ENCOUNTER — Other Ambulatory Visit: Payer: Self-pay | Admitting: Family Medicine

## 2018-06-12 DIAGNOSIS — R109 Unspecified abdominal pain: Secondary | ICD-10-CM

## 2018-06-12 DIAGNOSIS — N200 Calculus of kidney: Secondary | ICD-10-CM

## 2018-06-13 DIAGNOSIS — E119 Type 2 diabetes mellitus without complications: Secondary | ICD-10-CM | POA: Diagnosis not present

## 2018-06-13 DIAGNOSIS — E785 Hyperlipidemia, unspecified: Secondary | ICD-10-CM | POA: Diagnosis not present

## 2018-06-13 DIAGNOSIS — E1159 Type 2 diabetes mellitus with other circulatory complications: Secondary | ICD-10-CM | POA: Diagnosis not present

## 2018-06-13 DIAGNOSIS — I10 Essential (primary) hypertension: Secondary | ICD-10-CM | POA: Diagnosis not present

## 2018-06-13 DIAGNOSIS — E1169 Type 2 diabetes mellitus with other specified complication: Secondary | ICD-10-CM | POA: Diagnosis not present

## 2018-06-16 ENCOUNTER — Ambulatory Visit: Payer: Medicare Other

## 2018-06-20 ENCOUNTER — Ambulatory Visit
Admission: RE | Admit: 2018-06-20 | Discharge: 2018-06-20 | Disposition: A | Discharge status: Home / Self Care | Payer: Medicare Other | Source: Ambulatory Visit | Attending: Family Medicine | Admitting: Family Medicine

## 2018-06-20 DIAGNOSIS — N289 Disorder of kidney and ureter, unspecified: Secondary | ICD-10-CM | POA: Insufficient documentation

## 2018-06-20 DIAGNOSIS — R109 Unspecified abdominal pain: Secondary | ICD-10-CM | POA: Diagnosis not present

## 2018-06-20 DIAGNOSIS — N189 Chronic kidney disease, unspecified: Secondary | ICD-10-CM | POA: Diagnosis not present

## 2018-06-20 DIAGNOSIS — N200 Calculus of kidney: Secondary | ICD-10-CM | POA: Insufficient documentation

## 2018-06-20 DIAGNOSIS — R51 Headache: Secondary | ICD-10-CM | POA: Diagnosis not present

## 2018-07-03 DIAGNOSIS — M7551 Bursitis of right shoulder: Secondary | ICD-10-CM | POA: Diagnosis not present

## 2018-07-03 DIAGNOSIS — E1159 Type 2 diabetes mellitus with other circulatory complications: Secondary | ICD-10-CM | POA: Diagnosis not present

## 2018-07-03 DIAGNOSIS — E291 Testicular hypofunction: Secondary | ICD-10-CM | POA: Diagnosis not present

## 2018-07-03 DIAGNOSIS — E1169 Type 2 diabetes mellitus with other specified complication: Secondary | ICD-10-CM | POA: Diagnosis not present

## 2018-07-03 DIAGNOSIS — E119 Type 2 diabetes mellitus without complications: Secondary | ICD-10-CM | POA: Diagnosis not present

## 2018-07-15 DIAGNOSIS — Z79899 Other long term (current) drug therapy: Secondary | ICD-10-CM | POA: Diagnosis not present

## 2018-07-15 DIAGNOSIS — M47814 Spondylosis without myelopathy or radiculopathy, thoracic region: Secondary | ICD-10-CM | POA: Diagnosis not present

## 2018-07-15 DIAGNOSIS — G8929 Other chronic pain: Secondary | ICD-10-CM | POA: Diagnosis not present

## 2018-07-15 DIAGNOSIS — M5412 Radiculopathy, cervical region: Secondary | ICD-10-CM | POA: Diagnosis not present

## 2018-07-16 ENCOUNTER — Ambulatory Visit
Admission: RE | Admit: 2018-07-16 | Discharge: 2018-07-16 | Disposition: A | Discharge status: Home / Self Care | Payer: Medicare Other | Source: Ambulatory Visit | Attending: Urology | Admitting: Urology

## 2018-07-16 ENCOUNTER — Ambulatory Visit (INDEPENDENT_AMBULATORY_CARE_PROVIDER_SITE_OTHER): Payer: Medicare Other | Admitting: Urology

## 2018-07-16 ENCOUNTER — Encounter: Payer: Self-pay | Admitting: Urology

## 2018-07-16 VITALS — BP 126/82 | HR 93 | Ht 66.0 in | Wt 162.3 lb

## 2018-07-16 DIAGNOSIS — N200 Calculus of kidney: Secondary | ICD-10-CM | POA: Diagnosis not present

## 2018-07-16 LAB — MICROSCOPIC EXAMINATION
EPITHELIAL CELLS (NON RENAL): NONE SEEN /HPF (ref 0–10)
RBC MICROSCOPIC, UA: NONE SEEN /HPF (ref 0–2)

## 2018-07-16 LAB — URINALYSIS, COMPLETE
Bilirubin, UA: NEGATIVE
Ketones, UA: NEGATIVE
Leukocytes, UA: NEGATIVE
NITRITE UA: NEGATIVE
PH UA: 5.5 (ref 5.0–7.5)
Protein, UA: NEGATIVE
RBC, UA: NEGATIVE
Specific Gravity, UA: 1.02 (ref 1.005–1.030)
UUROB: 0.2 mg/dL (ref 0.2–1.0)

## 2018-07-16 NOTE — Progress Notes (Addendum)
07/16/2018 11:50 AM   [name] Slipper Domingos Jr. 1971-08-18 [id number]  Referring provider: Sofie Hartigan, MD [address] [place], [place] [address]  CC: History of kidney stones  HPI: I had the pleasure of seeing [name] Brown in urology clinic today in consultation for history of kidney stones from Dr. Ellison Hughs.  He is a 47 year old male with chronic pain followed by pain management in [place] as well as testosterone supplementation from endocrinology who has a long history of kidney stones.  He has passed at least 2 stones spontaneously previously, most recently  within the last month.  He did obtain a renal ultrasound after he passed his most recent stone which showed no hydronephrosis, however suggested bilateral nonobstructing stones up to 6 mm.  Additionally, he has had numerous shockwave lithotripsy treatments before for ureteral stones with outside urologists.  He currently reports left-sided flank pain that radiates to the front, however this worsens with activity and prolonged standing and he feels it more likely musculoskeletal in nature.  There are no red cells on his urinalysis today.   He has a strong family history of kidney stones in his father.  He is interested in pursuing shockwave lithotripsy if he has any large stones on KUB.   PMH: Past Medical History:  Diagnosis Date  . Chronic kidney disease    H/O KIDNEY STONES  . Chronic pain   . Diabetes mellitus without complication ([place])   . DJD (degenerative joint disease)   . GERD (gastroesophageal reflux disease)   . Hyperlipidemia   . Hypogonadism male     Surgical History: Past Surgical History:  Procedure Laterality Date  . KNEE ARTHROSCOPY    . KNEE ARTHROSCOPY WITH MENISCAL REPAIR Right 01/19/2016   Procedure: KNEE ARTHROSCOPY WITH MENISCAL REPAIR;  Surgeon: Corky Mull, MD;  Location: [facility] ORS;  Service: Orthopedics;  Laterality: Right;  . KNEE ARTHROSCOPY WITH MENISCAL REPAIR Right 06/12/2016   Procedure: KNEE ARTHROSCOPY WITH PARTIAL MEDIAL MENISCAL REPAIR;  Surgeon: Corky Mull, MD;  Location: [facility] ORS;  Service: Orthopedics;  Laterality: Right;  . PARATHYROIDECTOMY    . SHOULDER SURGERY Bilateral   . SPINAL CORD STIMULATOR BATTERY EXCHANGE    . SPINAL CORD STIMULATOR IMPLANT  2008  . SPINAL FUSION     L4-L5 (2004), L5-S1 (2006)  . THYROIDECTOMY, PARTIAL    . TONSILLECTOMY      Allergies:  Allergies  Allergen Reactions  . Pravastatin Other (See Comments)    ELEVATED LFT'S  . Celexa [Citalopram] Rash    Family History: Family History  Problem Relation Age of Onset  . Bladder Cancer Neg Hx   . Kidney cancer Neg Hx   . Prostate cancer Neg Hx     Social History:  reports that he has never smoked. He has never used smokeless tobacco. He reports that he does not drink alcohol or use drugs.  ROS: Please see flowsheet from today's date for complete review of systems.  Physical Exam: BP 126/82 (BP Location: Left Arm, Patient Position: Sitting, Cuff Size: Normal)   Pulse 93   Ht 5\' 6"  (1.676 m)   Wt 162 lb 4.8 oz (73.6 kg)   BMI 26.20 kg/m    Constitutional:  Alert and oriented, No acute distress. Cardiovascular: No clubbing, cyanosis, or edema. Respiratory: Normal respiratory effort, no increased work of breathing. GI: Abdomen is soft, nontender, nondistended, no abdominal masses GU: No CVA tenderness Lymph: No cervical or inguinal lymphadenopathy. Skin: No rashes, bruises or suspicious  lesions. Neurologic: Grossly intact, no focal deficits, moving all 4 extremities. Psychiatric: Normal mood and affect.  Laboratory Data: Urinalysis today 0-5 WBCs, 0 RBCs, few bacteria, nitrite negative  Pertinent Imaging: I have personally reviewed the renal ultrasound dated 06/20/2018: No hydronephrosis.  Possible right lower pole nonobstructing stone, 6 mm left mid nonobstructing stone.  Assessment & Plan:   In summary, [name] Brown is a 47 year old healthy male with  chronic pain managed by pain management, and a long history of urolithiasis.  He recently passed a stone in the last 3 weeks.  Recent renal ultrasound shows no hydronephrosis or obstructing stones, but it does show bilateral nonobstructing stones up to 6 mm.  He is interested in pursuing KUB and possible shockwave lithotripsy for any large remaining stones, as he has a significant amount of pain when he has spontaneously passed stones.  We discussed general stone prevention strategies including adequate hydration with goal of producing 2.5 L of urine daily, increasing citric acid intake, increasing calcium intake during high oxalate meals, minimizing animal protein, and decreasing salt intake. Information about dietary recommendations given today.  Return for will call with KUB results.   ADDENDUM: KUB with bilateral renal stones, right ~55mm mid pole, LEFT 41mm lower pole. Discussed options including observation, SWL, URS/LL/stent. He would like to proceed with observation right now. Will set up KUB in one year for follow up. He will call us sooner if he desires intervention, or feels he is passing a stone.  [name], [facility] [address], [place], [place] [address] [phone]

## 2018-07-18 ENCOUNTER — Telehealth: Payer: Self-pay | Admitting: Urology

## 2018-07-18 NOTE — Telephone Encounter (Signed)
-----   Message from [name], MD sent at 07/17/2018 10:15 AM EDT ----- Regarding: follow up RTC one year with KUB prior.  Nickolas Madrid, MD 07/17/2018

## 2018-07-18 NOTE — Telephone Encounter (Signed)
App made and mailed to patient ° °MB °

## 2018-07-28 DIAGNOSIS — M47814 Spondylosis without myelopathy or radiculopathy, thoracic region: Secondary | ICD-10-CM | POA: Diagnosis not present

## 2018-07-28 DIAGNOSIS — M5412 Radiculopathy, cervical region: Secondary | ICD-10-CM | POA: Diagnosis not present

## 2018-08-06 ENCOUNTER — Other Ambulatory Visit: Payer: Self-pay | Admitting: Gastroenterology

## 2018-08-06 DIAGNOSIS — R109 Unspecified abdominal pain: Secondary | ICD-10-CM | POA: Diagnosis not present

## 2018-08-06 DIAGNOSIS — R1012 Left upper quadrant pain: Secondary | ICD-10-CM | POA: Diagnosis not present

## 2018-08-06 DIAGNOSIS — R1033 Periumbilical pain: Secondary | ICD-10-CM | POA: Diagnosis not present

## 2018-08-06 DIAGNOSIS — R634 Abnormal weight loss: Secondary | ICD-10-CM | POA: Diagnosis not present

## 2018-08-06 DIAGNOSIS — R0789 Other chest pain: Secondary | ICD-10-CM

## 2018-08-08 ENCOUNTER — Ambulatory Visit: Payer: Medicare Other

## 2018-08-13 ENCOUNTER — Ambulatory Visit
Admission: RE | Admit: 2018-08-13 | Discharge: 2018-08-13 | Disposition: A | Discharge status: Home / Self Care | Payer: Medicare Other | Source: Ambulatory Visit | Attending: Gastroenterology | Admitting: Gastroenterology

## 2018-08-13 DIAGNOSIS — R0789 Other chest pain: Secondary | ICD-10-CM

## 2018-08-13 DIAGNOSIS — I2510 Atherosclerotic heart disease of native coronary artery without angina pectoris: Secondary | ICD-10-CM | POA: Insufficient documentation

## 2018-08-13 DIAGNOSIS — R1012 Left upper quadrant pain: Secondary | ICD-10-CM

## 2018-08-13 DIAGNOSIS — R079 Chest pain, unspecified: Secondary | ICD-10-CM | POA: Diagnosis not present

## 2018-08-13 DIAGNOSIS — R1033 Periumbilical pain: Secondary | ICD-10-CM

## 2018-08-13 DIAGNOSIS — N200 Calculus of kidney: Secondary | ICD-10-CM | POA: Diagnosis not present

## 2018-08-13 DIAGNOSIS — R109 Unspecified abdominal pain: Secondary | ICD-10-CM

## 2018-08-13 DIAGNOSIS — I700 Atherosclerosis of aorta: Secondary | ICD-10-CM | POA: Insufficient documentation

## 2018-08-13 MED ORDER — IOPAMIDOL (ISOVUE-300) INJECTION 61%
100.0000 mL | Freq: Once | INTRAVENOUS | Status: AC | PRN
  Administered 2018-08-13: 100 mL via INTRAVENOUS

## 2018-08-14 DIAGNOSIS — G5601 Carpal tunnel syndrome, right upper limb: Secondary | ICD-10-CM | POA: Diagnosis not present

## 2018-08-15 ENCOUNTER — Other Ambulatory Visit: Payer: Self-pay

## 2018-08-15 ENCOUNTER — Ambulatory Visit
Admission: EM | Admit: 2018-08-15 | Discharge: 2018-08-15 | Disposition: A | Discharge status: Home / Self Care | Payer: Medicare Other | Attending: Family Medicine | Admitting: Family Medicine

## 2018-08-15 ENCOUNTER — Encounter: Payer: Self-pay | Admitting: Emergency Medicine

## 2018-08-15 DIAGNOSIS — Z79899 Other long term (current) drug therapy: Secondary | ICD-10-CM | POA: Diagnosis not present

## 2018-08-15 DIAGNOSIS — G8929 Other chronic pain: Secondary | ICD-10-CM | POA: Diagnosis not present

## 2018-08-15 DIAGNOSIS — M5412 Radiculopathy, cervical region: Secondary | ICD-10-CM | POA: Diagnosis not present

## 2018-08-15 DIAGNOSIS — M47817 Spondylosis without myelopathy or radiculopathy, lumbosacral region: Secondary | ICD-10-CM | POA: Diagnosis not present

## 2018-08-15 DIAGNOSIS — J069 Acute upper respiratory infection, unspecified: Secondary | ICD-10-CM | POA: Diagnosis not present

## 2018-08-15 DIAGNOSIS — M791 Myalgia, unspecified site: Secondary | ICD-10-CM | POA: Diagnosis not present

## 2018-08-15 MED ORDER — FLUTICASONE PROPIONATE 50 MCG/ACT NA SUSP: 2.0000 | Freq: Every day | NASAL | 0 refills | Status: DC

## 2018-08-15 MED ORDER — HYDROCOD POLST-CPM POLST ER 10-8 MG/5ML PO SUER: 5.0000 mL | Freq: Two times a day (BID) | ORAL | 0 refills | Status: DC

## 2018-08-15 MED ORDER — BENZONATATE 200 MG PO CAPS: ORAL_CAPSULE | ORAL | 0 refills | Status: DC

## 2018-08-15 NOTE — ED Provider Notes (Signed)
[facility]    CSN: [id number] Arrival date & time: 08/15/18  1325     History   Chief Complaint Chief Complaint  Patient presents with  . Nasal Congestion  . Cough  . Headache    HPI [name] Brown [facility]. is a 47 y.o. male.   HPI  47 year old male presents with nasal congestion productive cough of green sputum headache this started about a week ago.  The patient states he thinks he got it from his wife who has had it before him.  Denies any fever.  He is a non-smoker.  Tried Tylenol OTC which has not been helpful.  He also has been having this from his nose as well.  CT of the chest 2 days ago no abnormal findings in the lungs.          Past Medical History:  Diagnosis Date  . Chronic kidney disease    H/O KIDNEY STONES  . Chronic pain   . Diabetes mellitus without complication ([place])   . DJD (degenerative joint disease)   . GERD (gastroesophageal reflux disease)   . Hyperlipidemia   . Hypogonadism male     There are no active problems to display for this patient.   Past Surgical History:  Procedure Laterality Date  . KNEE ARTHROSCOPY    . KNEE ARTHROSCOPY WITH MENISCAL REPAIR Right 01/19/2016   Procedure: KNEE ARTHROSCOPY WITH MENISCAL REPAIR;  Surgeon: Corky Mull, MD;  Location: [facility] ORS;  Service: Orthopedics;  Laterality: Right;  . KNEE ARTHROSCOPY WITH MENISCAL REPAIR Right 06/12/2016   Procedure: KNEE ARTHROSCOPY WITH PARTIAL MEDIAL MENISCAL REPAIR;  Surgeon: Corky Mull, MD;  Location: [facility] ORS;  Service: Orthopedics;  Laterality: Right;  . PARATHYROIDECTOMY    . SHOULDER SURGERY Bilateral   . SPINAL CORD STIMULATOR BATTERY EXCHANGE    . SPINAL CORD STIMULATOR IMPLANT  2008  . SPINAL FUSION     L4-L5 (2004), L5-S1 (2006)  . THYROIDECTOMY, PARTIAL    . TONSILLECTOMY         Home Medications    Prior to Admission medications   Medication Sig Start Date End Date Taking? Authorizing Provider  acyclovir (ZOVIRAX) 200 MG capsule  Take 200 mg by mouth as needed.   Yes [provider]  cholecalciferol (VITAMIN D) 1000 units tablet Take 1,000 Units by mouth daily.   Yes [provider]  HYDROmorphone (DILAUDID) 4 MG tablet Take 4 mg by mouth every 4 (four) hours as needed for severe pain.   Yes [provider]  hydrOXYzine (ATARAX/VISTARIL) 10 MG tablet Take 10 mg by mouth 3 (three) times daily as needed.   Yes [provider]  lisinopril (PRINIVIL,ZESTRIL) 10 MG tablet Take 10 mg by mouth every morning.   Yes [provider]  metFORMIN (GLUCOPHAGE) 500 MG tablet Take 500 mg by mouth 2 (two) times daily with a meal.   Yes [provider]  oxyCODONE (OXYCONTIN) 10 mg 12 hr tablet Take 10 mg by mouth every 12 (twelve) hours.   Yes [provider]  pantoprazole (PROTONIX) 20 MG tablet Take 20 mg by mouth daily.   Yes [provider]  testosterone cypionate (DEPOTESTOSTERONE CYPIONATE) 200 MG/ML injection Inject 200 mg into the muscle every 7 (seven) days.   Yes [provider]  traZODone (DESYREL) 50 MG tablet Take 12.5-25 mg by mouth at bedtime as needed for sleep.    Yes [provider]  benzonatate (TESSALON) 200 MG capsule Take one  cap TID PRN cough 08/15/18   Lorin Picket, PA-C  chlorpheniramine-HYDROcodone [facility]) 10-8 MG/5ML SUER Take 5 mLs by mouth 2 (two) times daily. 08/15/18   Lorin Picket, PA-C  fluticasone (FLONASE) 50 MCG/ACT nasal spray Place 2 sprays into both nostrils daily. 08/15/18   Lorin Picket, PA-C    Family History Family History  Problem Relation Age of Onset  . Bladder Cancer Neg Hx   . Kidney cancer Neg Hx   . Prostate cancer Neg Hx     Social History Social History   Tobacco Use  . Smoking status: Never Smoker  . Smokeless tobacco: Never Used  Substance Use Topics  . Alcohol use: No  . Drug use: No     Allergies   Pravastatin and Celexa [citalopram]   Review  of Systems Review of Systems  Constitutional: Positive for activity change. Negative for appetite change, chills, fatigue and fever.  HENT: Positive for congestion, postnasal drip, sinus pressure, sinus pain and sore throat.   Respiratory: Positive for cough and shortness of breath.   All other systems reviewed and are negative.    Physical Exam Triage Vital Signs ED Triage Vitals  Enc Vitals Group     BP 08/15/18 1350 123/86     Pulse Rate 08/15/18 1350 84     Resp 08/15/18 1350 18     Temp 08/15/18 1350 98.3 F (36.8 C)     Temp Source 08/15/18 1350 Oral     SpO2 08/15/18 1350 97 %     Weight 08/15/18 1348 165 lb (74.8 kg)     Height 08/15/18 1348 5\' 6"  (1.676 m)     Head Circumference --      Peak Flow --      Pain Score 08/15/18 1348 4     Pain Loc --      Pain Edu? --      Excl. in [place]? --    No data found.  Updated Vital Signs BP 123/86 (BP Location: Right Arm)   Pulse 84   Temp 98.3 F (36.8 C) (Oral)   Resp 18   Ht 5\' 6"  (1.676 m)   Wt 165 lb (74.8 kg)   SpO2 97%   BMI 26.63 kg/m   Visual Acuity Right Eye Distance:   Left Eye Distance:   Bilateral Distance:    Right Eye Near:   Left Eye Near:    Bilateral Near:     Physical Exam  Constitutional: He is oriented to person, place, and time. He appears well-developed and well-nourished.  Non-toxic appearance. He does not appear ill. No distress.  HENT:  Head: Normocephalic.  Mouth/Throat: Oropharynx is clear and moist.  Eyes: Pupils are equal, round, and reactive to light.  Neck: Normal range of motion.  Pulmonary/Chest: Effort normal and breath sounds normal.  Neurological: He is alert and oriented to person, place, and time. He has normal strength. He displays a negative Romberg sign.  Skin: Skin is warm and dry.  Psychiatric: He has a normal mood and affect. His behavior is normal.  Nursing note and vitals reviewed.    UC Treatments / Results  Labs (all labs ordered are listed, but only  abnormal results are displayed) Labs Reviewed - No data to display  EKG None  Radiology No results found.  Procedures Procedures (including critical care time)  Medications Ordered in UC Medications - No data to display  Initial Impression / Assessment and Plan / UC Course  I have reviewed the triage vital signs and the nursing notes.  Pertinent labs & imaging results that were available during my care of the patient were reviewed by me and considered in my medical decision making (see chart for details).     47 year old male presents with symptoms and signs of a upper respiratory infection likely viral.  I told him that he does not require antibiotics at this time.  We will treat him with cough suppressants and I recommended Flonase nasal spray.  He needs to have his symptoms or if he starts running high fevers should return to our clinic for further evaluation.  He may also go to his primary care physician.  Is CAT scan of his chest did not show any evidence of pneumonia. Final Clinical Impressions(s) / UC Diagnoses   Final diagnoses:  Upper respiratory tract infection, unspecified type   Discharge Instructions   None    ED Prescriptions    Medication Sig Dispense Auth. Provider   benzonatate (TESSALON) 200 MG capsule Take one cap TID PRN cough 30 capsule Lorin Picket, PA-C   chlorpheniramine-HYDROcodone (TUSSIONEX PENNKINETIC ER) 10-8 MG/5ML SUER Take 5 mLs by mouth 2 (two) times daily. 115 mL Crecencio Mc P, PA-C   fluticasone (FLONASE) 50 MCG/ACT nasal spray Place 2 sprays into both nostrils daily. 16 g Lorin Picket, PA-C     Controlled Substance Prescriptions  Controlled Substance Registry consulted? Not Applicable   Lorin Picket, PA-C 08/15/18 1511

## 2018-08-15 NOTE — ED Triage Notes (Signed)
Patient c/o nasal congestion, cough, headache that started 1 week ago. Patient denies fever. Patient has tried Tylenol OTC.

## 2018-08-18 DIAGNOSIS — Z01818 Encounter for other preprocedural examination: Secondary | ICD-10-CM | POA: Diagnosis not present

## 2018-08-18 DIAGNOSIS — I208 Other forms of angina pectoris: Secondary | ICD-10-CM | POA: Diagnosis not present

## 2018-08-18 DIAGNOSIS — E1169 Type 2 diabetes mellitus with other specified complication: Secondary | ICD-10-CM | POA: Diagnosis not present

## 2018-08-18 DIAGNOSIS — I2510 Atherosclerotic heart disease of native coronary artery without angina pectoris: Secondary | ICD-10-CM | POA: Diagnosis not present

## 2018-08-22 ENCOUNTER — Ambulatory Visit
Admission: EM | Admit: 2018-08-22 | Discharge: 2018-08-22 | Disposition: A | Discharge status: Home / Self Care | Payer: Medicare Other | Attending: Family Medicine | Admitting: Family Medicine

## 2018-08-22 DIAGNOSIS — Z79899 Other long term (current) drug therapy: Secondary | ICD-10-CM | POA: Insufficient documentation

## 2018-08-22 DIAGNOSIS — Z9889 Other specified postprocedural states: Secondary | ICD-10-CM | POA: Diagnosis not present

## 2018-08-22 DIAGNOSIS — E1122 Type 2 diabetes mellitus with diabetic chronic kidney disease: Secondary | ICD-10-CM | POA: Diagnosis not present

## 2018-08-22 DIAGNOSIS — N189 Chronic kidney disease, unspecified: Secondary | ICD-10-CM | POA: Diagnosis not present

## 2018-08-22 DIAGNOSIS — J0100 Acute maxillary sinusitis, unspecified: Secondary | ICD-10-CM | POA: Diagnosis not present

## 2018-08-22 DIAGNOSIS — Z888 Allergy status to other drugs, medicaments and biological substances status: Secondary | ICD-10-CM | POA: Diagnosis not present

## 2018-08-22 DIAGNOSIS — Z792 Long term (current) use of antibiotics: Secondary | ICD-10-CM | POA: Insufficient documentation

## 2018-08-22 DIAGNOSIS — K219 Gastro-esophageal reflux disease without esophagitis: Secondary | ICD-10-CM | POA: Insufficient documentation

## 2018-08-22 DIAGNOSIS — J029 Acute pharyngitis, unspecified: Secondary | ICD-10-CM | POA: Diagnosis present

## 2018-08-22 DIAGNOSIS — Z7984 Long term (current) use of oral hypoglycemic drugs: Secondary | ICD-10-CM | POA: Insufficient documentation

## 2018-08-22 DIAGNOSIS — Z79891 Long term (current) use of opiate analgesic: Secondary | ICD-10-CM | POA: Insufficient documentation

## 2018-08-22 DIAGNOSIS — G8929 Other chronic pain: Secondary | ICD-10-CM | POA: Diagnosis not present

## 2018-08-22 LAB — RAPID STREP SCREEN (MED CTR MEBANE ONLY): Streptococcus, Group A Screen (Direct): NEGATIVE

## 2018-08-22 MED ORDER — FLUTICASONE PROPIONATE 50 MCG/ACT NA SUSP: 2.0000 | Freq: Every day | NASAL | 0 refills | Status: DC

## 2018-08-22 MED ORDER — AMOXICILLIN 875 MG PO TABS: 875.0000 mg | ORAL_TABLET | Freq: Two times a day (BID) | ORAL | 0 refills | Status: DC

## 2018-08-22 NOTE — ED Provider Notes (Signed)
[facility]    CSN: [id number] Arrival date & time: 08/22/18  1146     History   Chief Complaint Chief Complaint  Patient presents with  . Sore Throat  . Headache    HPI [name] Brown. is a 47 y.o. male.   The history is provided by the patient.  Sore Throat  Associated symptoms include headaches.  Headache  Associated symptoms: congestion, cough, facial pain, fatigue, sore throat and URI   URI  Presenting symptoms: congestion, cough, facial pain, fatigue, rhinorrhea and sore throat   Severity:  Moderate Onset quality:  Sudden Duration:  2 weeks Timing:  Constant Progression:  Worsening Chronicity:  New Relieved by:  Nothing Ineffective treatments:  OTC medications Associated symptoms: headaches and sinus pain   Risk factors: recent illness and sick contacts   Risk factors: not elderly, no chronic cardiac disease, no chronic kidney disease, no chronic respiratory disease, no diabetes mellitus, no immunosuppression and no recent travel     Past Medical History:  Diagnosis Date  . Chronic kidney disease    H/O KIDNEY STONES  . Chronic pain   . Diabetes mellitus without complication ([place])   . DJD (degenerative joint disease)   . GERD (gastroesophageal reflux disease)   . Hyperlipidemia   . Hypogonadism male     There are no active problems to display for this patient.   Past Surgical History:  Procedure Laterality Date  . KNEE ARTHROSCOPY    . KNEE ARTHROSCOPY WITH MENISCAL REPAIR Right 01/19/2016   Procedure: KNEE ARTHROSCOPY WITH MENISCAL REPAIR;  Surgeon: Corky Mull, MD;  Location: [facility] ORS;  Service: Orthopedics;  Laterality: Right;  . KNEE ARTHROSCOPY WITH MENISCAL REPAIR Right 06/12/2016   Procedure: KNEE ARTHROSCOPY WITH PARTIAL MEDIAL MENISCAL REPAIR;  Surgeon: Corky Mull, MD;  Location: [facility] ORS;  Service: Orthopedics;  Laterality: Right;  . PARATHYROIDECTOMY    . SHOULDER SURGERY Bilateral   . SPINAL CORD STIMULATOR BATTERY  EXCHANGE    . SPINAL CORD STIMULATOR IMPLANT  2008  . SPINAL FUSION     L4-L5 (2004), L5-S1 (2006)  . THYROIDECTOMY, PARTIAL    . TONSILLECTOMY         Home Medications    Prior to Admission medications   Medication Sig Start Date End Date Taking? Authorizing Provider  benzonatate (TESSALON) 200 MG capsule Take one cap TID PRN cough 08/15/18  Yes Lorin Picket, PA-C  chlorpheniramine-HYDROcodone (TUSSIONEX PENNKINETIC ER) 10-8 MG/5ML SUER Take 5 mLs by mouth 2 (two) times daily. 08/15/18  Yes Lorin Picket, PA-C  cholecalciferol (VITAMIN D) 1000 units tablet Take 1,000 Units by mouth daily.   Yes [provider]  HYDROmorphone (DILAUDID) 4 MG tablet Take 4 mg by mouth every 4 (four) hours as needed for severe pain.   Yes [provider]  lisinopril (PRINIVIL,ZESTRIL) 10 MG tablet Take 10 mg by mouth every morning.   Yes [provider]  metFORMIN (GLUCOPHAGE) 500 MG tablet Take 500 mg by mouth 2 (two) times daily with a meal.   Yes [provider]  oxyCODONE (OXYCONTIN) 10 mg 12 hr tablet Take 10 mg by mouth every 12 (twelve) hours.   Yes [provider]  pantoprazole (PROTONIX) 20 MG tablet Take 20 mg by mouth daily.   Yes [provider]  testosterone cypionate (DEPOTESTOSTERONE CYPIONATE) 200 MG/ML injection Inject 200 mg into the muscle every 7 (seven) days.   Yes [provider]  acyclovir (ZOVIRAX) 200  MG capsule Take 200 mg by mouth as needed.    [provider]  amoxicillin (AMOXIL) 875 MG tablet Take 1 tablet (875 mg total) by mouth 2 (two) times daily. 08/22/18   Norval Gable, MD  fluticasone (FLONASE) 50 MCG/ACT nasal spray Place 2 sprays into both nostrils daily. 08/22/18   Norval Gable, MD  hydrOXYzine (ATARAX/VISTARIL) 10 MG tablet Take 10 mg by mouth 3 (three) times daily as needed.    [provider]  traZODone (DESYREL) 50 MG tablet Take 12.5-25 mg by mouth at bedtime as needed for  sleep.     [provider]    Family History Family History  Problem Relation Age of Onset  . Bladder Cancer Neg Hx   . Kidney cancer Neg Hx   . Prostate cancer Neg Hx     Social History Social History   Tobacco Use  . Smoking status: Never Smoker  . Smokeless tobacco: Never Used  Substance Use Topics  . Alcohol use: No  . Drug use: No     Allergies   Pravastatin and Celexa [citalopram]   Review of Systems Review of Systems  Constitutional: Positive for fatigue.  HENT: Positive for congestion, rhinorrhea, sinus pain and sore throat.   Respiratory: Positive for cough.   Neurological: Positive for headaches.     Physical Exam Triage Vital Signs ED Triage Vitals  Enc Vitals Group     BP 08/22/18 1157 127/87     Pulse Rate 08/22/18 1157 100     Resp 08/22/18 1157 16     Temp 08/22/18 1157 98.1 F (36.7 C)     Temp Source 08/22/18 1157 Oral     SpO2 08/22/18 1157 99 %     Weight --      Height --      Head Circumference --      Peak Flow --      Pain Score 08/22/18 1159 4     Pain Loc --      Pain Edu? --      Excl. in [place]? --    No data found.  Updated Vital Signs BP 127/87 (BP Location: Right Arm)   Pulse 100   Temp 98.1 F (36.7 C) (Oral)   Resp 16   SpO2 99%   Visual Acuity Right Eye Distance:   Left Eye Distance:   Bilateral Distance:    Right Eye Near:   Left Eye Near:    Bilateral Near:     Physical Exam  Constitutional: He appears well-developed and well-nourished. No distress.  HENT:  Head: Normocephalic and atraumatic.  Right Ear: Tympanic membrane, external ear and ear canal normal.  Left Ear: Tympanic membrane, external ear and ear canal normal.  Nose: Mucosal edema and rhinorrhea present. Right sinus exhibits maxillary sinus tenderness and frontal sinus tenderness. Left sinus exhibits maxillary sinus tenderness and frontal sinus tenderness.  Mouth/Throat: Uvula is midline and mucous membranes are normal. Posterior  oropharyngeal erythema present. No oropharyngeal exudate, posterior oropharyngeal edema or tonsillar abscesses. No tonsillar exudate.  Neck: Normal range of motion. Neck supple. No tracheal deviation present. No thyromegaly present.  Cardiovascular: Normal rate, regular rhythm and normal heart sounds.  Pulmonary/Chest: Effort normal and breath sounds normal. No stridor. No respiratory distress. He has no wheezes. He has no rales. He exhibits no tenderness.  Lymphadenopathy:    He has no cervical adenopathy.  Neurological: He is alert.  Skin: Skin is warm and dry. No rash noted. He  is not diaphoretic.  Nursing note and vitals reviewed.    UC Treatments / Results  Labs (all labs ordered are listed, but only abnormal results are displayed) Labs Reviewed  RAPID STREP SCREEN ([facility] ONLY)  CULTURE, GROUP A STREP [facility])    EKG None  Radiology No results found.  Procedures Procedures (including critical care time)  Medications Ordered in UC Medications - No data to display  Initial Impression / Assessment and Plan / UC Course  I have reviewed the triage vital signs and the nursing notes.  Pertinent labs & imaging results that were available during my care of the patient were reviewed by me and considered in my medical decision making (see chart for details).      Final Clinical Impressions(s) / UC Diagnoses   Final diagnoses:  Acute maxillary sinusitis, recurrence not specified    ED Prescriptions    Medication Sig Dispense Auth. Provider   fluticasone (FLONASE) 50 MCG/ACT nasal spray Place 2 sprays into both nostrils daily. 16 g Norval Gable, MD   amoxicillin (AMOXIL) 875 MG tablet Take 1 tablet (875 mg total) by mouth 2 (two) times daily. 20 tablet Norval Gable, MD      1. Lab results and diagnosis reviewed with patient 2. rx as per orders above; reviewed possible side effects, interactions, risks and benefits  3. Recommend supportive treatment with  fluids, otc analgesics prn 4. Follow-up prn if symptoms worsen or don't improve   Controlled Substance Prescriptions [place] Controlled Substance Registry consulted? Not Applicable   Norval Gable, MD 08/22/18 1320

## 2018-08-22 NOTE — ED Triage Notes (Signed)
Was here Friday and was told issue was viral.  No resolution of symptoms so came back.   Today felt like starting all over again.  Sore throat started worsening.  anaterior Head pressure  Coughing  Felt feverish but not posted one.

## 2018-08-25 LAB — CULTURE, GROUP A STREP (THRC)

## 2018-09-03 DIAGNOSIS — I208 Other forms of angina pectoris: Secondary | ICD-10-CM | POA: Diagnosis not present

## 2018-09-03 DIAGNOSIS — I2510 Atherosclerotic heart disease of native coronary artery without angina pectoris: Secondary | ICD-10-CM | POA: Diagnosis not present

## 2018-09-03 DIAGNOSIS — Z01810 Encounter for preprocedural cardiovascular examination: Secondary | ICD-10-CM | POA: Diagnosis not present

## 2018-09-08 DIAGNOSIS — Z01818 Encounter for other preprocedural examination: Secondary | ICD-10-CM | POA: Diagnosis not present

## 2018-09-08 DIAGNOSIS — E1169 Type 2 diabetes mellitus with other specified complication: Secondary | ICD-10-CM | POA: Diagnosis not present

## 2018-09-08 DIAGNOSIS — E1159 Type 2 diabetes mellitus with other circulatory complications: Secondary | ICD-10-CM | POA: Diagnosis not present

## 2018-09-08 DIAGNOSIS — I2510 Atherosclerotic heart disease of native coronary artery without angina pectoris: Secondary | ICD-10-CM | POA: Diagnosis not present

## 2018-10-10 DIAGNOSIS — G894 Chronic pain syndrome: Secondary | ICD-10-CM | POA: Diagnosis not present

## 2018-10-10 DIAGNOSIS — M791 Myalgia, unspecified site: Secondary | ICD-10-CM | POA: Diagnosis not present

## 2018-10-10 DIAGNOSIS — Z79899 Other long term (current) drug therapy: Secondary | ICD-10-CM | POA: Diagnosis not present

## 2018-10-10 DIAGNOSIS — M47812 Spondylosis without myelopathy or radiculopathy, cervical region: Secondary | ICD-10-CM | POA: Diagnosis not present

## 2018-10-10 DIAGNOSIS — M5412 Radiculopathy, cervical region: Secondary | ICD-10-CM | POA: Diagnosis not present

## 2018-11-05 DIAGNOSIS — Z96653 Presence of artificial knee joint, bilateral: Secondary | ICD-10-CM | POA: Diagnosis not present

## 2018-11-05 DIAGNOSIS — Z96651 Presence of right artificial knee joint: Secondary | ICD-10-CM | POA: Diagnosis not present

## 2018-11-06 DIAGNOSIS — M7552 Bursitis of left shoulder: Secondary | ICD-10-CM | POA: Diagnosis not present

## 2018-11-07 DIAGNOSIS — R51 Headache: Secondary | ICD-10-CM | POA: Diagnosis not present

## 2018-11-25 DIAGNOSIS — G5691 Unspecified mononeuropathy of right upper limb: Secondary | ICD-10-CM | POA: Insufficient documentation

## 2018-11-25 DIAGNOSIS — G8929 Other chronic pain: Secondary | ICD-10-CM | POA: Diagnosis not present

## 2018-11-25 DIAGNOSIS — M25511 Pain in right shoulder: Secondary | ICD-10-CM | POA: Diagnosis not present

## 2018-11-27 DIAGNOSIS — R519 Headache, unspecified: Secondary | ICD-10-CM | POA: Insufficient documentation

## 2018-11-27 DIAGNOSIS — R51 Headache: Secondary | ICD-10-CM | POA: Diagnosis not present

## 2018-11-27 DIAGNOSIS — G932 Benign intracranial hypertension: Secondary | ICD-10-CM | POA: Diagnosis not present

## 2018-12-10 ENCOUNTER — Other Ambulatory Visit: Payer: Self-pay | Admitting: Acute Care

## 2018-12-10 DIAGNOSIS — E7800 Pure hypercholesterolemia, unspecified: Secondary | ICD-10-CM | POA: Diagnosis not present

## 2018-12-10 DIAGNOSIS — E1159 Type 2 diabetes mellitus with other circulatory complications: Secondary | ICD-10-CM | POA: Diagnosis not present

## 2018-12-10 DIAGNOSIS — E1169 Type 2 diabetes mellitus with other specified complication: Secondary | ICD-10-CM | POA: Diagnosis not present

## 2018-12-10 DIAGNOSIS — Z0000 Encounter for general adult medical examination without abnormal findings: Secondary | ICD-10-CM | POA: Diagnosis not present

## 2018-12-10 DIAGNOSIS — E119 Type 2 diabetes mellitus without complications: Secondary | ICD-10-CM | POA: Diagnosis not present

## 2018-12-10 DIAGNOSIS — Z125 Encounter for screening for malignant neoplasm of prostate: Secondary | ICD-10-CM | POA: Diagnosis not present

## 2018-12-10 DIAGNOSIS — K219 Gastro-esophageal reflux disease without esophagitis: Secondary | ICD-10-CM | POA: Diagnosis not present

## 2018-12-10 DIAGNOSIS — I10 Essential (primary) hypertension: Secondary | ICD-10-CM | POA: Diagnosis not present

## 2018-12-10 DIAGNOSIS — G932 Benign intracranial hypertension: Secondary | ICD-10-CM

## 2018-12-10 DIAGNOSIS — E785 Hyperlipidemia, unspecified: Secondary | ICD-10-CM | POA: Diagnosis not present

## 2018-12-10 DIAGNOSIS — M2550 Pain in unspecified joint: Secondary | ICD-10-CM | POA: Diagnosis not present

## 2018-12-16 ENCOUNTER — Ambulatory Visit
Admission: RE | Admit: 2018-12-16 | Discharge: 2018-12-16 | Disposition: A | Discharge status: Home / Self Care | Payer: Medicare Other | Source: Ambulatory Visit | Attending: Acute Care | Admitting: Acute Care

## 2018-12-16 DIAGNOSIS — G932 Benign intracranial hypertension: Secondary | ICD-10-CM | POA: Diagnosis not present

## 2018-12-16 DIAGNOSIS — R51 Headache: Secondary | ICD-10-CM | POA: Diagnosis not present

## 2018-12-16 LAB — CBC
HEMATOCRIT: 50.5 % (ref 39.0–52.0)
Hemoglobin: 17.2 g/dL — ABNORMAL HIGH (ref 13.0–17.0)
MCH: 31.6 pg (ref 26.0–34.0)
MCHC: 34.1 g/dL (ref 30.0–36.0)
MCV: 92.8 fL (ref 80.0–100.0)
NRBC: 0 % (ref 0.0–0.2)
PLATELETS: 249 10*3/uL (ref 150–400)
RBC: 5.44 MIL/uL (ref 4.22–5.81)
RDW: 13.6 % (ref 11.5–15.5)
WBC: 7.2 10*3/uL (ref 4.0–10.5)

## 2018-12-16 LAB — APTT: APTT: 25 s (ref 24–36)

## 2018-12-16 LAB — PROTIME-INR
INR: 1 (ref 0.8–1.2)
PROTHROMBIN TIME: 12.9 s (ref 11.4–15.2)

## 2018-12-16 LAB — GLUCOSE, CAPILLARY: Glucose-Capillary: 161 mg/dL — ABNORMAL HIGH (ref 70–99)

## 2018-12-16 MED ORDER — ACETAMINOPHEN 500 MG PO TABS
1000.0000 mg | ORAL_TABLET | Freq: Four times a day (QID) | ORAL | Status: DC | PRN
  Administered 2018-12-16: 1000 mg via ORAL
  Filled 2018-12-16 (×2): qty 2

## 2018-12-16 NOTE — Progress Notes (Signed)
Pt with bandaid cdi

## 2018-12-16 NOTE — Progress Notes (Signed)
Pt with bandaid cdi, dc instruction sheet given for spinal headaches, intracranial headaches and post op lumbar puncture,pt dced home with wife

## 2018-12-18 DIAGNOSIS — G932 Benign intracranial hypertension: Secondary | ICD-10-CM | POA: Diagnosis not present

## 2018-12-29 DIAGNOSIS — M791 Myalgia, unspecified site: Secondary | ICD-10-CM | POA: Diagnosis not present

## 2018-12-29 DIAGNOSIS — M47814 Spondylosis without myelopathy or radiculopathy, thoracic region: Secondary | ICD-10-CM | POA: Diagnosis not present

## 2018-12-29 DIAGNOSIS — M5412 Radiculopathy, cervical region: Secondary | ICD-10-CM | POA: Diagnosis not present

## 2018-12-29 DIAGNOSIS — G894 Chronic pain syndrome: Secondary | ICD-10-CM | POA: Diagnosis not present

## 2018-12-29 DIAGNOSIS — M1991 Primary osteoarthritis, unspecified site: Secondary | ICD-10-CM | POA: Diagnosis not present

## 2019-01-02 ENCOUNTER — Encounter: Payer: Self-pay | Admitting: *Deleted

## 2019-01-02 DIAGNOSIS — R1032 Left lower quadrant pain: Secondary | ICD-10-CM | POA: Diagnosis not present

## 2019-01-02 DIAGNOSIS — R1013 Epigastric pain: Secondary | ICD-10-CM | POA: Diagnosis not present

## 2019-01-02 DIAGNOSIS — R1012 Left upper quadrant pain: Secondary | ICD-10-CM | POA: Diagnosis not present

## 2019-01-02 DIAGNOSIS — R634 Abnormal weight loss: Secondary | ICD-10-CM | POA: Diagnosis not present

## 2019-01-05 ENCOUNTER — Ambulatory Visit: Payer: Medicare Other | Admitting: Anesthesiology

## 2019-01-05 ENCOUNTER — Other Ambulatory Visit: Payer: Self-pay

## 2019-01-05 ENCOUNTER — Encounter: Payer: Self-pay | Admitting: Gastroenterology

## 2019-01-05 ENCOUNTER — Encounter
Admission: RE | Disposition: A | Discharge status: Home / Self Care | Payer: Self-pay | Source: Home / Self Care | Attending: Gastroenterology

## 2019-01-05 ENCOUNTER — Ambulatory Visit
Admission: RE | Admit: 2019-01-05 | Discharge: 2019-01-05 | Disposition: A | Discharge status: Home / Self Care | Payer: Medicare Other | Attending: Gastroenterology | Admitting: Gastroenterology

## 2019-01-05 DIAGNOSIS — K635 Polyp of colon: Secondary | ICD-10-CM | POA: Diagnosis not present

## 2019-01-05 DIAGNOSIS — K2970 Gastritis, unspecified, without bleeding: Secondary | ICD-10-CM | POA: Diagnosis not present

## 2019-01-05 DIAGNOSIS — Z79899 Other long term (current) drug therapy: Secondary | ICD-10-CM | POA: Diagnosis not present

## 2019-01-05 DIAGNOSIS — Z7984 Long term (current) use of oral hypoglycemic drugs: Secondary | ICD-10-CM | POA: Insufficient documentation

## 2019-01-05 DIAGNOSIS — E1122 Type 2 diabetes mellitus with diabetic chronic kidney disease: Secondary | ICD-10-CM | POA: Diagnosis not present

## 2019-01-05 DIAGNOSIS — Q402 Other specified congenital malformations of stomach: Secondary | ICD-10-CM | POA: Diagnosis not present

## 2019-01-05 DIAGNOSIS — E119 Type 2 diabetes mellitus without complications: Secondary | ICD-10-CM | POA: Diagnosis not present

## 2019-01-05 DIAGNOSIS — R1032 Left lower quadrant pain: Secondary | ICD-10-CM | POA: Insufficient documentation

## 2019-01-05 DIAGNOSIS — K317 Polyp of stomach and duodenum: Secondary | ICD-10-CM | POA: Insufficient documentation

## 2019-01-05 DIAGNOSIS — B379 Candidiasis, unspecified: Secondary | ICD-10-CM | POA: Diagnosis not present

## 2019-01-05 DIAGNOSIS — R1012 Left upper quadrant pain: Secondary | ICD-10-CM | POA: Diagnosis not present

## 2019-01-05 DIAGNOSIS — R1033 Periumbilical pain: Secondary | ICD-10-CM | POA: Diagnosis not present

## 2019-01-05 DIAGNOSIS — Z87442 Personal history of urinary calculi: Secondary | ICD-10-CM | POA: Diagnosis not present

## 2019-01-05 DIAGNOSIS — R634 Abnormal weight loss: Secondary | ICD-10-CM | POA: Insufficient documentation

## 2019-01-05 DIAGNOSIS — R194 Change in bowel habit: Secondary | ICD-10-CM | POA: Diagnosis not present

## 2019-01-05 DIAGNOSIS — K449 Diaphragmatic hernia without obstruction or gangrene: Secondary | ICD-10-CM | POA: Diagnosis not present

## 2019-01-05 DIAGNOSIS — E785 Hyperlipidemia, unspecified: Secondary | ICD-10-CM | POA: Diagnosis not present

## 2019-01-05 DIAGNOSIS — K3189 Other diseases of stomach and duodenum: Secondary | ICD-10-CM | POA: Diagnosis not present

## 2019-01-05 DIAGNOSIS — I10 Essential (primary) hypertension: Secondary | ICD-10-CM | POA: Diagnosis not present

## 2019-01-05 DIAGNOSIS — N189 Chronic kidney disease, unspecified: Secondary | ICD-10-CM | POA: Diagnosis not present

## 2019-01-05 DIAGNOSIS — D131 Benign neoplasm of stomach: Secondary | ICD-10-CM | POA: Diagnosis not present

## 2019-01-05 DIAGNOSIS — I129 Hypertensive chronic kidney disease with stage 1 through stage 4 chronic kidney disease, or unspecified chronic kidney disease: Secondary | ICD-10-CM | POA: Diagnosis not present

## 2019-01-05 DIAGNOSIS — K228 Other specified diseases of esophagus: Secondary | ICD-10-CM | POA: Insufficient documentation

## 2019-01-05 DIAGNOSIS — D122 Benign neoplasm of ascending colon: Secondary | ICD-10-CM | POA: Insufficient documentation

## 2019-01-05 DIAGNOSIS — K219 Gastro-esophageal reflux disease without esophagitis: Secondary | ICD-10-CM | POA: Insufficient documentation

## 2019-01-05 HISTORY — DX: Essential (primary) hypertension: I10

## 2019-01-05 HISTORY — PX: ESOPHAGOGASTRODUODENOSCOPY (EGD) WITH PROPOFOL: SHX5813

## 2019-01-05 HISTORY — PX: COLONOSCOPY WITH PROPOFOL: SHX5780

## 2019-01-05 LAB — KOH PREP: KOH Prep: NONE SEEN

## 2019-01-05 LAB — GLUCOSE, CAPILLARY: Glucose-Capillary: 113 mg/dL — ABNORMAL HIGH (ref 70–99)

## 2019-01-05 SURGERY — COLONOSCOPY WITH PROPOFOL
Anesthesia: General

## 2019-01-05 MED ORDER — FENTANYL CITRATE (PF) 100 MCG/2ML IJ SOLN
INTRAMUSCULAR | Status: AC
  Filled 2019-01-05: qty 2

## 2019-01-05 MED ORDER — PROPOFOL 500 MG/50ML IV EMUL
INTRAVENOUS | Status: AC
  Filled 2019-01-05: qty 50

## 2019-01-05 MED ORDER — MIDAZOLAM HCL 2 MG/2ML IJ SOLN
INTRAMUSCULAR | Status: AC
  Filled 2019-01-05: qty 2

## 2019-01-05 MED ORDER — PROPOFOL 500 MG/50ML IV EMUL
INTRAVENOUS | Status: DC | PRN
  Administered 2019-01-05: 180 ug/kg/min via INTRAVENOUS

## 2019-01-05 MED ORDER — MIDAZOLAM HCL 2 MG/2ML IJ SOLN
INTRAMUSCULAR | Status: DC | PRN
  Administered 2019-01-05: 2 mg via INTRAVENOUS

## 2019-01-05 MED ORDER — PROPOFOL 10 MG/ML IV BOLUS
INTRAVENOUS | Status: DC | PRN
  Administered 2019-01-05: 50 mg via INTRAVENOUS

## 2019-01-05 MED ORDER — FENTANYL CITRATE (PF) 100 MCG/2ML IJ SOLN
INTRAMUSCULAR | Status: DC | PRN
  Administered 2019-01-05: 50 ug via INTRAVENOUS

## 2019-01-05 MED ORDER — SODIUM CHLORIDE 0.9 % IV SOLN
INTRAVENOUS | Status: DC
  Administered 2019-01-05: 1000 mL via INTRAVENOUS
  Administered 2019-01-05: 11:00:00 via INTRAVENOUS

## 2019-01-05 NOTE — Anesthesia Preprocedure Evaluation (Addendum)
Anesthesia Evaluation  Patient identified by MRN, date of birth, ID band Patient awake    Reviewed: Allergy & Precautions, H&P , NPO status , Patient's Chart, lab work & pertinent test results  Airway Mallampati: II  TM Distance: >3 FB     Dental  (+) Teeth Intact   Pulmonary neg pulmonary ROS, neg COPD, neg recent URI,           Cardiovascular hypertension, (-) angina(-) Past MI (-) dysrhythmias      Neuro/Psych negative neurological ROS  negative psych ROS   GI/Hepatic Neg liver ROS, GERD  Controlled,  Endo/Other  diabetes  Renal/GU CRFRenal disease  negative genitourinary   Musculoskeletal   Abdominal   Peds  Hematology negative hematology ROS (+)   Anesthesia Other Findings Past Medical History: No date: Chronic kidney disease     Comment:  H/O KIDNEY STONES No date: Chronic pain No date: Diabetes mellitus without complication (HCC) No date: DJD (degenerative joint disease) No date: GERD (gastroesophageal reflux disease) No date: Hyperlipidemia No date: Hypertension No date: Hypogonadism male  Past Surgical History: No date: KNEE ARTHROSCOPY 01/19/2016: KNEE ARTHROSCOPY WITH MENISCAL REPAIR; Right     Comment:  Procedure: KNEE ARTHROSCOPY WITH MENISCAL REPAIR;                Surgeon: Corky Mull, MD;  Location: [facility] ORS;  Service:              Orthopedics;  Laterality: Right; 06/12/2016: KNEE ARTHROSCOPY WITH MENISCAL REPAIR; Right     Comment:  Procedure: KNEE ARTHROSCOPY WITH PARTIAL MEDIAL MENISCAL              REPAIR;  Surgeon: Corky Mull, MD;  Location: [facility] ORS;               Service: Orthopedics;  Laterality: Right; No date: PARATHYROIDECTOMY No date: SHOULDER SURGERY; Bilateral No date: SPINAL CORD STIMULATOR BATTERY EXCHANGE 2008: SPINAL CORD STIMULATOR IMPLANT No date: SPINAL FUSION     Comment:  L4-L5 (2004), L5-S1 (2006) No date: THYROIDECTOMY, PARTIAL No date: TONSILLECTOMY      Reproductive/Obstetrics negative OB ROS                            Anesthesia Physical Anesthesia Plan  ASA: III  Anesthesia Plan: General   Post-op Pain Management:    Induction:   PONV Risk Score and Plan: Propofol infusion and TIVA  Airway Management Planned: Nasal Cannula  Additional Equipment:   Intra-op Plan:   Post-operative Plan:   Informed Consent: I have reviewed the patients History and Physical, chart, labs and discussed the procedure including the risks, benefits and alternatives for the proposed anesthesia with the patient or authorized representative who has indicated his/her understanding and acceptance.     Dental Advisory Given  Plan Discussed with: Anesthesiologist and CRNA  Anesthesia Plan Comments:         Anesthesia Quick Evaluation

## 2019-01-05 NOTE — Transfer of Care (Signed)
Immediate Anesthesia Transfer of Care Note  Patient: [name] Slipper Milhoan Jr.  Procedure(s) Performed: COLONOSCOPY WITH PROPOFOL (N/A ) ESOPHAGOGASTRODUODENOSCOPY (EGD) WITH PROPOFOL (N/A )  Patient Location: PACU  Anesthesia Type:General  Level of Consciousness: sedated  Airway & Oxygen Therapy: Patient Spontanous Breathing and Patient connected to nasal cannula oxygen  Post-op Assessment: Report given to RN and Post -op Vital signs reviewed and stable  Post vital signs: Reviewed and stable  Last Vitals:  Vitals Value Taken Time  BP 95/56 01/05/2019 12:06 PM  Temp 36.3 C 01/05/2019 12:06 PM  Pulse 97 01/05/2019 12:08 PM  Resp 16 01/05/2019 12:08 PM  SpO2 95 % 01/05/2019 12:08 PM  Vitals shown include unvalidated device data.  Last Pain:  Vitals:   01/05/19 1206  TempSrc:   PainSc: Asleep         Complications: No apparent anesthesia complications

## 2019-01-05 NOTE — Op Note (Signed)
[facility] Patient Name: [name] Brown Procedure Date: 01/05/2019 11:12 AM MRN: [id number] Account #: [id number] Date of Birth: [birth year] Admit Type: Outpatient Age: 48 Room: [facility] ENDO ROOM 2 Gender: Male Note Status: Finalized Procedure:            Upper GI endoscopy Indications:          Abdominal pain in the left upper quadrant,                        Periumbilical abdominal pain Providers:            Lollie Sails, MD Referring MD:         Sofie Hartigan (Referring MD) Medicines:            Monitored Anesthesia Care Complications:        No immediate complications. Procedure:            Pre-Anesthesia Assessment:                       - ASA Grade Assessment: III - A patient with severe                        systemic disease.                       After obtaining informed consent, the endoscope was                        passed under direct vision. Throughout the procedure,                        the patient's blood pressure, pulse, and oxygen                        saturations were monitored continuously. The Endoscope                        was introduced through the mouth, and advanced to the                        third part of duodenum. The upper GI endoscopy was                        accomplished without difficulty. The patient tolerated                        the procedure well. Findings:      Patchy, white plaques were found in the upper third of the esophagus, in       the middle third of the esophagus and in the lower third of the       esophagus. Cells for cytology were obtained by brushing.      The Z-line was irregular. Biopsies were taken with a cold forceps for       histology.      A small hiatal hernia was found. The Z-line was a variable distance from       incisors; the hiatal hernia was sliding.      The exam of the esophagus was otherwise normal.      Diffuse minimal inflammation characterized by congestion  (edema) and  erythema was found in the gastric body. Biopsies were taken with a cold       forceps for histology.      The cardia and gastric fundus were normal on retroflexion otherwise.      Diffuse moderate mucosal variance characterized by altered texture was       found in the duodenal bulb. Biopsies were taken with a cold forceps for       histology.      Diffuse mucosal flattening was found in the second portion of the       duodenum and in the third portion of the duodenum. Biopsies were taken       with a cold forceps for histology.      Two 2 to 3 mm sessile polyps with no bleeding and no stigmata of recent       bleeding were found in the gastric body. Biopsies were taken with a cold       forceps for histology. Impression:           - Esophageal plaques were found, suspicious for                        candidiasis. Cells for cytology obtained.                       - Z-line irregular. Biopsied.                       - Small hiatal hernia.                       - Gastritis. Biopsied.                       - Mucosal variant in the duodenum. Biopsied.                       - Flattened mucosa was found in the duodenum, not                        consistent with celiac disease. Biopsied.                       - Two gastric polyps. Biopsied. Recommendation:       - Discharge patient to home.                       - Perform a colonoscopy today.                       - Do a gastric emptying study at appointment to be                        scheduled. Procedure Code(s):    --- Professional ---                       [phone], Esophagogastroduodenoscopy, flexible, transoral;                        with biopsy, single or multiple Diagnosis Code(s):    --- Professional ---                       K22.9, Disease of esophagus,  unspecified                       K22.8, Other specified diseases of esophagus                       K44.9, Diaphragmatic hernia without obstruction or                         gangrene                       K29.70, Gastritis, unspecified, without bleeding                       K31.89, Other diseases of stomach and duodenum                       K31.7, Polyp of stomach and duodenum                       R10.12, Left upper quadrant pain                       B63.89, Periumbilical pain CPT copyright 2018 American Medical Association. All rights reserved. The codes documented in this report are preliminary and upon coder review may  be revised to meet current compliance requirements. Lollie Sails, MD 01/05/2019 11:41:11 AM This report has been signed electronically. Number of Addenda: 0 Note Initiated On: 01/05/2019 11:12 AM      [facility]

## 2019-01-05 NOTE — Op Note (Signed)
[facility] Patient Name: [name] Brown Procedure Date: 01/05/2019 11:11 AM MRN: [id number] Account #: [id number] Date of Birth: Jul 05, 1971 Admit Type: Outpatient Age: 48 Room: [facility] ENDO ROOM 2 Gender: Male Note Status: Finalized Procedure:            Colonoscopy Indications:          Abdominal pain in the left lower quadrant, Abdominal                        pain in the left upper quadrant, Change in bowel habits Providers:            Lollie Sails, MD Referring MD:         Sofie Hartigan (Referring MD) Medicines:            Monitored Anesthesia Care Complications:        No immediate complications. Procedure:            Pre-Anesthesia Assessment:                       - ASA Grade Assessment: III - A patient with severe                        systemic disease.                       After obtaining informed consent, the colonoscope was                        passed under direct vision. Throughout the procedure,                        the patient's blood pressure, pulse, and oxygen                        saturations were monitored continuously. The                        Colonoscope was introduced through the anus and                        advanced to the the terminal ileum. The colonoscopy was                        performed without difficulty. The patient tolerated the                        procedure well. The quality of the bowel preparation                        was good. Findings:      A 3 mm polyp was found in the distal ascending colon. The polyp was       sessile. The polyp was removed with a cold biopsy forceps. Resection and       retrieval were complete.      The exam was otherwise normal throughout the examined colon.      The retroflexed view of the distal rectum and anal verge was normal and       showed no anal or rectal abnormalities. Biopsies for histology were       taken with a cold  forceps from the right colon  and left colon for       evaluation of microscopic colitis.      The digital rectal exam was normal.      The terminal ileum appeared normal. Impression:           - One 3 mm polyp in the distal ascending colon, removed                        with a cold biopsy forceps. Resected and retrieved.                       - The distal rectum and anal verge are normal on                        retroflexion view. Recommendation:       - Discharge patient to home.                       - Telephone GI clinic for pathology results in 5 days.                       - Soft diet today, then advance as tolerated to advance                        diet as tolerated. Procedure Code(s):    --- Professional ---                       [phone], Colonoscopy, flexible; with biopsy, single or                        multiple Diagnosis Code(s):    --- Professional ---                       D12.2, Benign neoplasm of ascending colon                       R10.32, Left lower quadrant pain                       R10.12, Left upper quadrant pain                       R19.4, Change in bowel habit CPT copyright 2018 American Medical Association. All rights reserved. The codes documented in this report are preliminary and upon coder review may  be revised to meet current compliance requirements. Lollie Sails, MD 01/05/2019 12:05:17 PM This report has been signed electronically. Number of Addenda: 0 Note Initiated On: 01/05/2019 11:11 AM Scope Withdrawal Time: 0 hours 12 minutes 26 seconds  Total Procedure Duration: 0 hours 16 minutes 35 seconds       [facility]

## 2019-01-05 NOTE — Anesthesia Procedure Notes (Signed)
Date/Time: 01/05/2019 11:15 AM Performed by: Allean Found, CRNA Pre-anesthesia Checklist: Patient identified, Emergency Drugs available, Suction available, Patient being monitored and Timeout performed Patient Re-evaluated:Patient Re-evaluated prior to induction Oxygen Delivery Method: Nasal cannula Placement Confirmation: positive ETCO2

## 2019-01-05 NOTE — H&P (Signed)
Outpatient short stay form Pre-procedure 01/05/2019 11:09 AM Lollie Sails MD  Primary Physician: Dr. Thereasa Distance  Reason for visit: EGD and colonoscopy  History of present illness: Patient is a 48 year old male presenting today for an EGD and colonoscopy in regards to new left upper quadrant and left lower quadrant pain.  He is also had some weight loss.  Never had a EGD or colonoscopy in the past.  His symptoms began about little over 2 months ago.  There is some gastric bloating and night awakening.  Had a change of bowel habits are loose and unformed stool.  The no blood in the stool is no frank diarrhea.  No family history of colon cancer or colon polyps.  Patient has had abdominal CT scan that was uninformative.  He does have a history of nephrolithiasis however has seen both cardiology and urology and they feel his symptoms are not therefore related.  Patient tolerated his prep well.  He takes no aspirin or blood thinning agent.    Current Facility-Administered Medications:  .  0.9 %  sodium chloride infusion, , Intravenous, Continuous, Lollie Sails, MD, Last Rate: 20 mL/hr at 01/05/19 1056, 1,000 mL at 01/05/19 1056  Medications Prior to Admission  Medication Sig Dispense Refill Last Dose  . acyclovir (ZOVIRAX) 200 MG capsule Take 200 mg by mouth as needed.   Past Week at Unknown time  . amoxicillin (AMOXIL) 875 MG tablet Take 1 tablet (875 mg total) by mouth 2 (two) times daily. 20 tablet 0 Past Week at Unknown time  . benzonatate (TESSALON) 200 MG capsule Take one cap TID PRN cough 30 capsule 0 Past Week at Unknown time  . chlorpheniramine-HYDROcodone (TUSSIONEX PENNKINETIC ER) 10-8 MG/5ML SUER Take 5 mLs by mouth 2 (two) times daily. 115 mL 0 Past Week at Unknown time  . cholecalciferol (VITAMIN D) 1000 units tablet Take 1,000 Units by mouth daily.   Past Week at Unknown time  . fluticasone (FLONASE) 50 MCG/ACT nasal spray Place 2 sprays into both nostrils daily. 16 g  0 Past Week at Unknown time  . HYDROmorphone (DILAUDID) 4 MG tablet Take 4 mg by mouth every 4 (four) hours as needed for severe pain.   Past Week at Unknown time  . hydrOXYzine (ATARAX/VISTARIL) 10 MG tablet Take 10 mg by mouth 3 (three) times daily as needed.   Past Week at Unknown time  . lisinopril (PRINIVIL,ZESTRIL) 10 MG tablet Take 10 mg by mouth every morning.   Past Week at Unknown time  . metFORMIN (GLUCOPHAGE) 500 MG tablet Take 500 mg by mouth 2 (two) times daily with a meal.   Past Week at Unknown time  . oxyCODONE (OXYCONTIN) 10 mg 12 hr tablet Take 10 mg by mouth every 12 (twelve) hours.   01/05/2019 at 0600  . pantoprazole (PROTONIX) 20 MG tablet Take 20 mg by mouth daily.   Past Week at Unknown time  . testosterone cypionate (DEPOTESTOSTERONE CYPIONATE) 200 MG/ML injection Inject 200 mg into the muscle every 7 (seven) days.   Past Week at Unknown time  . traZODone (DESYREL) 50 MG tablet Take 12.5-25 mg by mouth at bedtime as needed for sleep.    Past Week at Unknown time     Allergies  Allergen Reactions  . Pravastatin Other (See Comments)    ELEVATED LFT'S  . Celexa [Citalopram] Rash     Past Medical History:  Diagnosis Date  . Chronic kidney disease    H/O KIDNEY STONES  .  Chronic pain   . Diabetes mellitus without complication ([place])   . DJD (degenerative joint disease)   . GERD (gastroesophageal reflux disease)   . Hyperlipidemia   . Hypertension   . Hypogonadism male     Review of systems:      Physical Exam    Heart and lungs: Rhythm without rub or gallop, lungs are bilaterally clear.    HEENT: Normocephalic atraumatic eyes are anicteric    Other:    Pertinant exam for procedure: Soft nontender nondistended bowel sounds positive normoactive    Planned proceedures: EGD, colonoscopy and indicated procedures. I have discussed the risks benefits and complications of procedures to include not limited to bleeding, infection, perforation and the risk of  sedation and the patient wishes to proceed.    Lollie Sails, MD Gastroenterology 01/05/2019  11:09 AM

## 2019-01-05 NOTE — Anesthesia Post-op Follow-up Note (Signed)
Anesthesia QCDR form completed.        

## 2019-01-06 ENCOUNTER — Encounter: Payer: Self-pay | Admitting: Urology

## 2019-01-06 ENCOUNTER — Other Ambulatory Visit: Payer: Self-pay

## 2019-01-06 ENCOUNTER — Ambulatory Visit (INDEPENDENT_AMBULATORY_CARE_PROVIDER_SITE_OTHER): Payer: Medicare Other | Admitting: Urology

## 2019-01-06 VITALS — BP 123/75 | HR 79 | Ht 66.0 in | Wt 165.0 lb

## 2019-01-06 DIAGNOSIS — E1169 Type 2 diabetes mellitus with other specified complication: Secondary | ICD-10-CM | POA: Insufficient documentation

## 2019-01-06 DIAGNOSIS — N200 Calculus of kidney: Secondary | ICD-10-CM

## 2019-01-06 DIAGNOSIS — M47817 Spondylosis without myelopathy or radiculopathy, lumbosacral region: Secondary | ICD-10-CM | POA: Insufficient documentation

## 2019-01-06 DIAGNOSIS — I10 Essential (primary) hypertension: Secondary | ICD-10-CM

## 2019-01-06 DIAGNOSIS — G8929 Other chronic pain: Secondary | ICD-10-CM | POA: Insufficient documentation

## 2019-01-06 DIAGNOSIS — E785 Hyperlipidemia, unspecified: Secondary | ICD-10-CM

## 2019-01-06 DIAGNOSIS — Z9889 Other specified postprocedural states: Secondary | ICD-10-CM | POA: Insufficient documentation

## 2019-01-06 DIAGNOSIS — Z87442 Personal history of urinary calculi: Secondary | ICD-10-CM | POA: Insufficient documentation

## 2019-01-06 DIAGNOSIS — I152 Hypertension secondary to endocrine disorders: Secondary | ICD-10-CM | POA: Insufficient documentation

## 2019-01-06 DIAGNOSIS — E890 Postprocedural hypothyroidism: Secondary | ICD-10-CM | POA: Insufficient documentation

## 2019-01-06 DIAGNOSIS — K219 Gastro-esophageal reflux disease without esophagitis: Secondary | ICD-10-CM | POA: Insufficient documentation

## 2019-01-06 DIAGNOSIS — E1159 Type 2 diabetes mellitus with other circulatory complications: Secondary | ICD-10-CM | POA: Insufficient documentation

## 2019-01-06 LAB — URINALYSIS, COMPLETE
Bilirubin, UA: NEGATIVE
Glucose, UA: NEGATIVE
Ketones, UA: NEGATIVE
LEUKOCYTES UA: NEGATIVE
Nitrite, UA: NEGATIVE
Protein, UA: NEGATIVE
Specific Gravity, UA: >1.03 — ABNORMAL HIGH (ref 1.005–1.030)
Urobilinogen, Ur: 0.2 mg/dL (ref 0.2–1.0)
pH, UA: 5 (ref 5.0–7.5)

## 2019-01-06 LAB — MICROSCOPIC EXAMINATION: Bacteria, UA: NONE SEEN

## 2019-01-06 LAB — SURGICAL PATHOLOGY

## 2019-01-06 MED ORDER — TAMSULOSIN HCL 0.4 MG PO CAPS: 0.4000 mg | ORAL_CAPSULE | Freq: Every day | ORAL | 0 refills | Status: DC

## 2019-01-06 NOTE — Progress Notes (Signed)
   01/06/2019 10:46 AM   [name] Slipper Bellavance Jr. 1971/01/10 [id number]  Reason for visit: Follow up nephrolithiasis  HPI: I saw Mr. Perkins in urology clinic today as an add-on for left-sided groin pain.  He is a 48 year old male with history of chronic pain followed by pain management as well as history of testosterone supplementation by endocrinology, and long history of nephrolithiasis.  He notes that he has had about a week of mild left-sided groin pain that is sharp in nature and similar to prior stones.  It is well controlled with medications.  He has undergone lithotripsy in the past.  His last cross-sectional imaging was CT abdomen pelvis in October 2019, which showed a 6 mm right lower pole stone, as well as a few nonobstructing left punctate stones, the biggest 2-53mm in size.  He denies any fevers or chills.  He has been straining his urine and has noted some small fragments.    Urinalysis today shows no signs of infection, but 3-10 RBCs consistent with passing a ureteral stone.  We discussed various treatment options for urolithiasis including observation with or without medical expulsive therapy, shockwave lithotripsy (SWL), ureteroscopy and laser lithotripsy with stent placement, and percutaneous nephrolithotomy.  We discussed that management is based on stone size, location, density, patient co-morbidities, and patient preference.   Stones <44mm in size have a >80% spontaneous passage rate. Data surrounding the use of tamsulosin for medical expulsive therapy is controversial, but meta analyses suggests it is most efficacious for distal stones between 5-69mm in size. Possible side effects include dizziness/lightheadedness, and retrograde ejaculation.  SWL has a lower stone free rate in a single procedure, but also a lower complication rate compared to ureteroscopy and avoids a stent and associated stent related symptoms. Possible complications include renal hematoma, steinstrasse, and  need for additional treatment.  Ureteroscopy with laser lithotripsy and stent placement has a higher stone free rate than SWL in a single procedure, however increased complication rate including possible infection, ureteral injury, bleeding, and stent related morbidity. Common stent related symptoms include dysuria, urgency/frequency, and flank pain.  After an extensive discussion of the risks and benefits of the above treatment options, the patient would like to proceed with medical expulsive therapy for his left-sided suspected ureteral stone.  He would like to follow-up in 2 to 3 weeks to discuss right-sided elective shockwave lithotripsy for his nonobstructive 6 mm stone.  We also discussed the pros and cons of obtaining additional imaging at this time including CT stone protocol or KUB.  In the setting of extensive imaging previously and his young age, we elected to defer imaging at this time.  I suspect he has a small left ureteral stone that has a greater than 90% chance of passing on its own.  -Flomax x2 weeks -Pain control PRN -RTC 2 weeks for symptom check, consider imaging with KUB or CT at that time if persistent symptoms, if asymptomatic, will pursue right elective shockwave lithotripsy for 6 mm stone  A total of 15 minutes were spent face-to-face with the patient, greater than 50% was spent in patient education, counseling, and coordination of care regarding nephrolithiasis and treatment strategies.   [name], [facility] [address] [place], [place] [address] [phone]

## 2019-01-07 NOTE — Anesthesia Postprocedure Evaluation (Signed)
Anesthesia Post Note  Patient: [name] Slipper Brooke Jr.  Procedure(s) Performed: COLONOSCOPY WITH PROPOFOL (N/A ) ESOPHAGOGASTRODUODENOSCOPY (EGD) WITH PROPOFOL (N/A )  Patient location during evaluation: PACU Anesthesia Type: General Level of consciousness: awake and alert Pain management: pain level controlled Vital Signs Assessment: post-procedure vital signs reviewed and stable Respiratory status: spontaneous breathing, nonlabored ventilation and respiratory function stable Cardiovascular status: blood pressure returned to baseline and stable Postop Assessment: no apparent nausea or vomiting Anesthetic complications: no     Last Vitals:  Vitals:   01/05/19 1236 01/05/19 1246  BP: 109/76 116/80  Pulse: 90   Resp: (!) 9 (!) 9  Temp:    SpO2: 97% 99%    Last Pain:  Vitals:   01/06/19 0727  TempSrc:   PainSc: 0-No pain                 Durenda Hurt

## 2019-01-08 DIAGNOSIS — G43019 Migraine without aura, intractable, without status migrainosus: Secondary | ICD-10-CM | POA: Diagnosis not present

## 2019-01-08 DIAGNOSIS — R200 Anesthesia of skin: Secondary | ICD-10-CM | POA: Diagnosis not present

## 2019-01-12 ENCOUNTER — Telehealth: Payer: Self-pay | Admitting: Urology

## 2019-01-12 NOTE — Telephone Encounter (Signed)
Please cancel his 3/31 visit, and re-schedule for 4 weeks with KUB.  Thanks Nickolas Madrid, MD 01/12/2019

## 2019-01-12 NOTE — Telephone Encounter (Signed)
Pt called to let you know that he passed his stone.  He has another and said you were supposed to discuss surgery.  He knows there are no elective surgeries happening right now.  He said if he needed to cancel or reschedule his appt for 3/31, he would be happy to.

## 2019-01-14 DIAGNOSIS — E1169 Type 2 diabetes mellitus with other specified complication: Secondary | ICD-10-CM | POA: Diagnosis not present

## 2019-01-14 DIAGNOSIS — E291 Testicular hypofunction: Secondary | ICD-10-CM | POA: Diagnosis not present

## 2019-01-14 DIAGNOSIS — E221 Hyperprolactinemia: Secondary | ICD-10-CM | POA: Diagnosis not present

## 2019-01-14 DIAGNOSIS — E119 Type 2 diabetes mellitus without complications: Secondary | ICD-10-CM | POA: Diagnosis not present

## 2019-01-20 ENCOUNTER — Ambulatory Visit: Payer: Medicare Other | Admitting: Urology

## 2019-01-28 ENCOUNTER — Other Ambulatory Visit: Payer: Self-pay | Admitting: *Deleted

## 2019-01-28 DIAGNOSIS — N200 Calculus of kidney: Secondary | ICD-10-CM

## 2019-01-28 MED ORDER — TAMSULOSIN HCL 0.4 MG PO CAPS: 0.4000 mg | ORAL_CAPSULE | Freq: Every day | ORAL | 0 refills | Status: DC

## 2019-02-03 DIAGNOSIS — K210 Gastro-esophageal reflux disease with esophagitis: Secondary | ICD-10-CM | POA: Diagnosis not present

## 2019-02-03 DIAGNOSIS — K2950 Unspecified chronic gastritis without bleeding: Secondary | ICD-10-CM | POA: Diagnosis not present

## 2019-02-03 DIAGNOSIS — R198 Other specified symptoms and signs involving the digestive system and abdomen: Secondary | ICD-10-CM | POA: Diagnosis not present

## 2019-02-09 DIAGNOSIS — G5601 Carpal tunnel syndrome, right upper limb: Secondary | ICD-10-CM | POA: Diagnosis not present

## 2019-02-12 ENCOUNTER — Ambulatory Visit: Payer: Medicare Other | Admitting: Urology

## 2019-02-23 ENCOUNTER — Other Ambulatory Visit: Payer: Self-pay

## 2019-02-23 DIAGNOSIS — N200 Calculus of kidney: Secondary | ICD-10-CM

## 2019-02-23 MED ORDER — TAMSULOSIN HCL 0.4 MG PO CAPS: 0.4000 mg | ORAL_CAPSULE | Freq: Every day | ORAL | 0 refills | Status: DC

## 2019-02-27 ENCOUNTER — Telehealth: Payer: Self-pay

## 2019-02-27 NOTE — Telephone Encounter (Signed)
Per Dr. Burt Knack, scheduled patient for NP e-visit 5/12. Consent obtained.      Virtual Visit Pre-Appointment Phone Call  1. Confirm consent - "In the setting of the current Covid19 crisis, you are scheduled for a (phone or video) visit with your provider on (date) at (time).  Just as we do with many in-office visits, in order for you to participate in this visit, we must obtain consent.  If you'd like, I can send this to your mychart (if signed up) or email for you to review.  Otherwise, I can obtain your verbal consent now.  All virtual visits are billed to your insurance company just like a normal visit would be.  By agreeing to a virtual visit, we'd like you to understand that the technology does not allow for your provider to perform an examination, and thus may limit your provider's ability to fully assess your condition. If your provider identifies any concerns that need to be evaluated in person, we will make arrangements to do so.  Finally, though the technology is pretty good, we cannot assure that it will always work on either your or our end, and in the setting of a video visit, we may have to convert it to a phone-only visit.  In either situation, we cannot ensure that we have a secure connection.  Are you willing to proceed?" STAFF: Did the patient verbally acknowledge consent to telehealth visit? Document YES/NO here: YES    TELEPHONE CALL NOTE  Jailon Schaible Wojcicki Jr. has been deemed a candidate for a follow-up tele-health visit to limit community exposure during the Covid-19 pandemic. I spoke with the patient via phone to ensure availability of phone/video source, confirm preferred email & phone number, and discuss instructions and expectations.  I reminded Anuel Sitter [facility] to be prepared with any vital sign and/or heart rhythm information that could potentially be obtained via home monitoring, at the time of his visit. I reminded Thurmond Hildebran [facility] to expect a phone call prior to his  visit.   IF USING DOXIMITY or [email or web address] - The patient will receive a link just prior to their visit by text.

## 2019-03-03 ENCOUNTER — Telehealth (INDEPENDENT_AMBULATORY_CARE_PROVIDER_SITE_OTHER): Payer: Medicare Other | Admitting: Cardiovascular Disease

## 2019-03-03 ENCOUNTER — Encounter: Payer: Self-pay | Admitting: Cardiovascular Disease

## 2019-03-03 ENCOUNTER — Other Ambulatory Visit: Payer: Self-pay

## 2019-03-03 VITALS — BP 176/100 | HR 91 | Ht 66.0 in | Wt 160.0 lb

## 2019-03-03 DIAGNOSIS — E782 Mixed hyperlipidemia: Secondary | ICD-10-CM | POA: Diagnosis not present

## 2019-03-03 DIAGNOSIS — I25119 Atherosclerotic heart disease of native coronary artery with unspecified angina pectoris: Secondary | ICD-10-CM

## 2019-03-03 DIAGNOSIS — R079 Chest pain, unspecified: Secondary | ICD-10-CM

## 2019-03-03 DIAGNOSIS — I10 Essential (primary) hypertension: Secondary | ICD-10-CM

## 2019-03-03 MED ORDER — METOPROLOL TARTRATE 100 MG PO TABS: 100.0000 mg | ORAL_TABLET | Freq: Once | ORAL | 0 refills | Status: DC

## 2019-03-03 NOTE — Addendum Note (Signed)
Addended by: Harland German A on: 03/03/2019 04:07 PM   Modules accepted: Orders

## 2019-03-03 NOTE — Progress Notes (Signed)
Virtual Visit via Video Note   This visit type was conducted due to national recommendations for restrictions regarding the COVID-19 Pandemic (e.g. social distancing) in an effort to limit this patient's exposure and mitigate transmission in our community.  Due to his co-morbid illnesses, this patient is at least at moderate risk for complications without adequate follow up.  This format is felt to be most appropriate for this patient at this time.  All issues noted in this document were discussed and addressed.  A limited physical exam was performed with this format.  Please refer to the patient's chart for his consent to telehealth for [facility].   Date:  03/03/2019   ID:  [name] Caul., DOB [birth year], MRN [id number]  Patient Location: Home Provider Location: Home  PCP:  Sofie Hartigan, MD  Cardiologist:  No primary care provider on file.  Electrophysiologist:  None   Evaluation Performed:  New Patient Evaluation  Chief Complaint:  Chest pain  History of Present Illness:    [name] Olden Ashworth Jr. is a 48 y.o. male with history of incidentally noted CAD based on coronary calcification on CT of the chest.  The patient does not have symptoms concerning for COVID-19 infection (fever, chills, cough, or new shortness of breath).   The patient is referred for evaluation of coronary artery disease.  This was incidentally detected on a noncontrasted CT scan that included chest windows and coronary calcification was identified.  Last year he underwent echo and nuclear stress test evaluation.  The echocardiogram demonstrated normal LV systolic function with no significant valvular abnormalities.  The nuclear scan demonstrated good exercise tolerance with normal perfusion.  The patient has type 2 diabetes, hypertension, and mixed hyperlipidemia.  He also has a strong family history of premature CAD in his male relatives including his father.  The patient himself is been a lifelong  non-smoker.  He was exposed to a lot of secondhand smoke when he was young.  He describes left-sided chest discomfort that feels like a muscle pulling, occurring on an intermittent basis.  This is not been consistently related to physical exertion.  There are no clear exacerbating factors.  There is no radiation of pain to the neck, jaw, or arm.  He has had no shortness of breath, orthopnea, or PND.  He denies leg swelling or heart palpitations.   Past Medical History:  Diagnosis Date  . Chronic kidney disease    H/O KIDNEY STONES  . Chronic pain   . Diabetes mellitus without complication ([place])   . DJD (degenerative joint disease)   . GERD (gastroesophageal reflux disease)   . Hyperlipidemia   . Hypertension   . Hypogonadism male    Past Surgical History:  Procedure Laterality Date  . COLONOSCOPY WITH PROPOFOL N/A 01/05/2019   Procedure: COLONOSCOPY WITH PROPOFOL;  Surgeon: Lollie Sails, MD;  Location: [facility] ENDOSCOPY;  Service: Endoscopy;  Laterality: N/A;  . ESOPHAGOGASTRODUODENOSCOPY (EGD) WITH PROPOFOL N/A 01/05/2019   Procedure: ESOPHAGOGASTRODUODENOSCOPY (EGD) WITH PROPOFOL;  Surgeon: Lollie Sails, MD;  Location: [facility] ENDOSCOPY;  Service: Endoscopy;  Laterality: N/A;  . KNEE ARTHROSCOPY    . KNEE ARTHROSCOPY WITH MENISCAL REPAIR Right 01/19/2016   Procedure: KNEE ARTHROSCOPY WITH MENISCAL REPAIR;  Surgeon: Corky Mull, MD;  Location: [facility] ORS;  Service: Orthopedics;  Laterality: Right;  . KNEE ARTHROSCOPY WITH MENISCAL REPAIR Right 06/12/2016   Procedure: KNEE ARTHROSCOPY WITH PARTIAL MEDIAL MENISCAL REPAIR;  Surgeon: Corky Mull, MD;  Location: [facility]  ORS;  Service: Orthopedics;  Laterality: Right;  . PARATHYROIDECTOMY    . SHOULDER SURGERY Bilateral   . SPINAL CORD STIMULATOR BATTERY EXCHANGE    . SPINAL CORD STIMULATOR IMPLANT  2008  . SPINAL FUSION     L4-L5 (2004), L5-S1 (2006)  . THYROIDECTOMY, PARTIAL    . TONSILLECTOMY       Current Meds  Medication Sig  .  acyclovir (ZOVIRAX) 200 MG capsule Take 200 mg by mouth as needed.  . cholecalciferol (VITAMIN D) 1000 units tablet Take 1,000 Units by mouth daily.  . clomiPHENE (CLOMID) 50 MG tablet TAKE 0.5 TABLETS (25 MG TOTAL) BY MOUTH ONCE DAILY  . fluticasone (FLONASE) 50 MCG/ACT nasal spray Place 2 sprays into both nostrils daily as needed for allergies or rhinitis.  . Fremanezumab-vfrm (AJOVY) 225 MG/1.5ML SOSY Inject 225 mg subcutaneously every 28 (twenty-eight) days  . HYDROmorphone (DILAUDID) 4 MG tablet Take 4 mg by mouth every 4 (four) hours as needed for severe pain.  . hydrOXYzine (ATARAX/VISTARIL) 10 MG tablet Take 10 mg by mouth 3 (three) times daily as needed.  Marland Kitchen lisinopril (PRINIVIL,ZESTRIL) 10 MG tablet Take 10 mg by mouth every morning.  . metFORMIN (GLUCOPHAGE) 500 MG tablet Take 500 mg by mouth 2 (two) times daily with a meal.  . oxyCODONE (OXYCONTIN) 10 mg 12 hr tablet Take 10 mg by mouth every 12 (twelve) hours.  . pantoprazole (PROTONIX) 40 MG tablet Take 40 mg by mouth daily.  . pregabalin (LYRICA) 75 MG capsule Take 75 mg by mouth 2 (two) times daily.   . rosuvastatin (CRESTOR) 5 MG tablet TAKE 1 TABLET BY MOUTH EVERY MONDAY, WEDNESDAY AND FRIDAY AM  . testosterone cypionate (DEPOTESTOSTERONE CYPIONATE) 200 MG/ML injection Inject 200 mg into the muscle every 7 (seven) days.  . traZODone (DESYREL) 50 MG tablet Take 25 mg by mouth at bedtime as needed for sleep.      Allergies:   Pravastatin and Celexa [citalopram]   Social History   Tobacco Use  . Smoking status: Never Smoker  . Smokeless tobacco: Never Used  Substance Use Topics  . Alcohol use: No  . Drug use: No     Family Hx: The patient's family history is negative for Bladder Cancer, Kidney cancer, and Prostate cancer.  Father - severe CAD first diagnosed in his 64's. Multiple paternal uncles with CAD  ROS:   Please see the history of present illness.    All other systems reviewed and are negative.   Prior CV  studies:   The following studies were reviewed today:  Nuclear perfusion study 09-03-2018: Procedure: The patient performed treadmill exercise using a Bruce protocol for 11:00  minutes. The exercise test was stopped due to fatigue. Blood pressure  response was normal. The patient did not develop any symptoms other than  fatigue during the procedure secondary to knee pain  Rest HR: 86bpm Rest BP: 102/59mmHg Max HR: 173bpm Max BP: 184/56mmHg Mets:   13.40 % MAX HR:  100%  Stress Test Administered by: Oswald Hillock, CMA  ECG Interpretation: Rest ECG: normal sinus rhythm, none Stress ECG: sinus tachycardia,  Recovery ECG: normal sinus rhythm ECG Interpretation: negative, nondiagnostic changes.   Administrations This Visit   technetium Tc46m sestamibi (CARDIOLITE) injection 37.90 millicurie   Admin Date 09/03/2018 Action Given Dose 24.09 millicurie Route Intravenous Administered By Herbert Seta, CNMT     technetium Tc76m sestamibi (CARDIOLITE) injection 73.53 millicurie   Admin Date 09/03/2018 Action Given Dose 29.92 millicurie Route Intravenous  Administered By Herbert Seta, CNMT       Gated post-stress perfusion imaging was performed 30 minutes after stress.  Rest images were performed 30 minutes after injection.  Gated LV Analysis:  TID: 1.06  LVEF= 58 %  FINDINGS: Regional wall motion: reveals normal myocardial thickening and wall  motion. The overall quality of the study is good.  Artifacts noted: no Left ventricular cavity: normal.  Perfusion Analysis: SPECT images demonstrate homogeneous tracer  distribution throughout the myocardium.   Echo 09-09-2018: INTERPRETATION NORMAL LEFT VENTRICULAR SYSTOLIC FUNCTION WITH AN ESTIMATED EF = >55 % NORMAL RIGHT VENTRICULAR SYSTOLIC FUNCTION MILD MITRAL VALVE INSUFFICIENCY TRACE TRICUSPID VALVE INSUFFICIENCY NO VALVULAR STENOSIS  Labs/Other Tests and Data Reviewed:    EKG:  No ECG  reviewed.  Recent Labs: 12/16/2018: Hemoglobin 17.2; Platelets 249   Recent Lipid Panel No results found for: CHOL, TRIG, HDL, CHOLHDL, LDLCALC, LDLDIRECT  Wt Readings from Last 3 Encounters:  03/03/19 160 lb (72.6 kg)  01/06/19 165 lb (74.8 kg)  01/05/19 165 lb (74.8 kg)     Objective:    Vital Signs:  BP (!) 176/100   Pulse 91   Ht 5\' 6"  (1.676 m)   Wt 160 lb (72.6 kg)   BMI 25.82 kg/m    VITAL SIGNS:  reviewed The patient is alert, oriented, in no distress.  He is well-appearing.  He is breathing comfortably in normal conversation.  Remaining exam deferred as this is a virtual/telehealth visit.  ASSESSMENT & PLAN:    1. Coronary artery disease, native vessel, with atypical chest pain 2. Type 2 diabetes, managed with oral hypoglycemics 3. Hypertension 4. Mixed hyperlipidemia with statin intolerance  Outside records reviewed including echo and stress test data from 2019.  The patient has a very strong family history of premature CAD.  He has incidentally noted CAD based on findings of a noncontrasted CT scan demonstrating coronary calcification.  With ongoing symptoms of chest discomfort and multiple cardiovascular risk factors, I have recommended a gated coronary CTA scan for further evaluation.  As long as his CT scan demonstrates no high-grade obstruction, it would be appropriate to treat him with ongoing aggressive medical therapy.  Unfortunately he appears to be having some side effects from Crestor with memory loss and cognitive side effects.  These have improved since he held Crestor over the past 2 weeks.  He will continue to stay off of this and we will discuss further when we review results of his coronary CTA.  COVID-19 Education: The signs and symptoms of COVID-19 were discussed with the patient and how to seek care for testing (follow up with PCP or arrange E-visit).  The importance of social distancing was discussed today.  Time:   Today, I have spent 30 minutes  with the patient with telehealth technology discussing the above problems.     Medication Adjustments/Labs and Tests Ordered: Current medicines are reviewed at length with the patient today.  Concerns regarding medicines are outlined above.   Tests Ordered: No orders of the defined types were placed in this encounter.   Medication Changes: No orders of the defined types were placed in this encounter.   Disposition:  Follow up prn  Signed, Sherren Mocha, MD  03/03/2019 3:37 PM    [place]

## 2019-03-03 NOTE — Patient Instructions (Addendum)
Medication Instructions:  Your provider recommends that you continue on your current medications as directed. Please refer to the Current Medication list given to you today.    Testing/Procedures: Dr. Burt Knack recommends you have a coronary CT.  Follow-Up: Your provider recommends that you schedule a follow-up appointment AS NEEDED pending CT results.    CORONARY CT INSTRUCTIONS: Your CT will be completed at [place] will need to enter through the [facility] main entrance.  You will be called to arrange date/time. [facility] [address] [place], [place] [address] [phone]  Once you arrive, proceed to the [facility] Radiology Department (First Floor).  Please follow these instructions carefully:  Hold all erectile dysfunction medications at least 48 hours prior to test.  On the Night Before the Test: . Be sure to Drink plenty of water. . Do not consume any caffeinated/decaffeinated beverages or chocolate 12 hours prior to your test. . Do not take any antihistamines (hydroxyzine) 12 hours prior to your test. . DO NOT TAKE Metformin 24 hours prior to test.  On the Day of the Test: . Drink plenty of water. Do not drink any water within one hour of the test. . Do not eat any food 4 hours prior to the test. . You may take your regular medications prior to the test (except Metformin and hydroxyzine) . Take metoprolol (Lopressor) 100 mg two hours prior to test time. I have called in a 1 time order for this medication to your [facility].   After the Test: . Drink plenty of water. . After receiving IV contrast, you may experience a mild flushed feeling. This is normal. . On occasion, you may experience a mild rash up to 24 hours after the test. This is not dangerous. If this occurs, you can take Benadryl 25 mg and increase your fluid intake. . If you experience trouble breathing, this can be serious. If it is severe call 911 IMMEDIATELY. If it is mild,  please call our office. . If you take any of these medications: Glipizide/Metformin, Avandament, Glucavance, please do not take 48 hours after completing test.

## 2019-03-30 ENCOUNTER — Other Ambulatory Visit: Payer: Self-pay

## 2019-03-30 DIAGNOSIS — M5412 Radiculopathy, cervical region: Secondary | ICD-10-CM | POA: Diagnosis not present

## 2019-03-30 DIAGNOSIS — M791 Myalgia, unspecified site: Secondary | ICD-10-CM | POA: Diagnosis not present

## 2019-03-30 DIAGNOSIS — G894 Chronic pain syndrome: Secondary | ICD-10-CM | POA: Diagnosis not present

## 2019-03-30 DIAGNOSIS — Z79899 Other long term (current) drug therapy: Secondary | ICD-10-CM | POA: Diagnosis not present

## 2019-03-30 DIAGNOSIS — M47814 Spondylosis without myelopathy or radiculopathy, thoracic region: Secondary | ICD-10-CM | POA: Diagnosis not present

## 2019-03-30 MED ORDER — TAMSULOSIN HCL 0.4 MG PO CAPS: 0.4000 mg | ORAL_CAPSULE | Freq: Every day | ORAL | 3 refills | Status: AC

## 2019-03-31 ENCOUNTER — Ambulatory Visit
Admission: RE | Admit: 2019-03-31 | Discharge: 2019-03-31 | Disposition: A | Discharge status: Home / Self Care | Payer: Medicare Other | Attending: Urology | Admitting: Urology

## 2019-03-31 ENCOUNTER — Other Ambulatory Visit: Payer: Self-pay | Admitting: *Deleted

## 2019-03-31 ENCOUNTER — Ambulatory Visit: Payer: Medicare Other | Admitting: Urology

## 2019-03-31 ENCOUNTER — Ambulatory Visit
Admission: RE | Admit: 2019-03-31 | Discharge: 2019-03-31 | Disposition: A | Discharge status: Home / Self Care | Payer: Medicare Other | Source: Ambulatory Visit | Attending: Urology | Admitting: Urology

## 2019-03-31 ENCOUNTER — Other Ambulatory Visit: Payer: Self-pay

## 2019-03-31 DIAGNOSIS — N200 Calculus of kidney: Secondary | ICD-10-CM

## 2019-04-08 ENCOUNTER — Other Ambulatory Visit: Payer: Self-pay

## 2019-04-08 DIAGNOSIS — I10 Essential (primary) hypertension: Secondary | ICD-10-CM

## 2019-04-08 DIAGNOSIS — R079 Chest pain, unspecified: Secondary | ICD-10-CM

## 2019-04-16 DIAGNOSIS — G4440 Drug-induced headache, not elsewhere classified, not intractable: Secondary | ICD-10-CM | POA: Diagnosis not present

## 2019-04-16 DIAGNOSIS — R200 Anesthesia of skin: Secondary | ICD-10-CM | POA: Diagnosis not present

## 2019-04-17 ENCOUNTER — Telehealth: Payer: Self-pay | Admitting: Cardiovascular Disease

## 2019-04-17 NOTE — Telephone Encounter (Signed)
New Message             Patient is calling to get a call back from [place] concerning his labs and additional questions. Pls call to advise

## 2019-04-17 NOTE — Telephone Encounter (Signed)
I spoke with pt. He has not had BMP done prior to CT which is scheduled for June 30.  He is currently out of town and cannot have blood work done prior to Monday. I told pt creatinine could be checked at hospital prior to CT. He will plan on going to hospital for CT as planned.

## 2019-04-20 ENCOUNTER — Other Ambulatory Visit (HOSPITAL_COMMUNITY): Payer: Self-pay | Admitting: Emergency Medicine

## 2019-04-20 ENCOUNTER — Telehealth (HOSPITAL_COMMUNITY): Payer: Self-pay | Admitting: Emergency Medicine

## 2019-04-20 DIAGNOSIS — R079 Chest pain, unspecified: Secondary | ICD-10-CM

## 2019-04-20 NOTE — Telephone Encounter (Signed)
Reaching out to patient to offer assistance regarding upcoming cardiac imaging study; pt verbalizes understanding of appt date/time, parking situation and where to check in, pre-test NPO status and medications ordered, and verified current allergies; name and call back number provided for further questions should they arise Allah Reason RN Navigator Cardiac Imaging [facility] [phone] office [phone] cell  Pt denies covid symptoms, verbalized understanding of visitor policy. 

## 2019-04-21 ENCOUNTER — Ambulatory Visit (HOSPITAL_COMMUNITY): Payer: Medicare Other

## 2019-04-21 ENCOUNTER — Ambulatory Visit (HOSPITAL_COMMUNITY)
Admission: RE | Admit: 2019-04-21 | Discharge: 2019-04-21 | Disposition: A | Discharge status: Home / Self Care | Payer: Medicare Other | Source: Ambulatory Visit | Attending: Cardiovascular Disease | Admitting: Cardiovascular Disease

## 2019-04-21 ENCOUNTER — Other Ambulatory Visit: Payer: Self-pay

## 2019-04-21 DIAGNOSIS — I25119 Atherosclerotic heart disease of native coronary artery with unspecified angina pectoris: Secondary | ICD-10-CM | POA: Insufficient documentation

## 2019-04-21 DIAGNOSIS — R079 Chest pain, unspecified: Secondary | ICD-10-CM | POA: Diagnosis not present

## 2019-04-21 LAB — POCT I-STAT CREATININE: Creatinine, Ser: 1 mg/dL (ref 0.61–1.24)

## 2019-04-21 MED ORDER — NITROGLYCERIN 0.4 MG SL SUBL
SUBLINGUAL_TABLET | SUBLINGUAL | Status: AC
  Filled 2019-04-21: qty 2

## 2019-04-21 MED ORDER — IOHEXOL 350 MG/ML SOLN
100.0000 mL | Freq: Once | INTRAVENOUS | Status: AC | PRN
  Administered 2019-04-21: 100 mL via INTRAVENOUS

## 2019-04-21 MED ORDER — NITROGLYCERIN 0.4 MG SL SUBL
0.8000 mg | SUBLINGUAL_TABLET | SUBLINGUAL | Status: DC | PRN
  Administered 2019-04-21: 0.8 mg via SUBLINGUAL

## 2019-04-30 ENCOUNTER — Telehealth: Payer: Self-pay

## 2019-04-30 DIAGNOSIS — E782 Mixed hyperlipidemia: Secondary | ICD-10-CM

## 2019-04-30 NOTE — Telephone Encounter (Signed)
-----   Message from Sherren Mocha, MD sent at 04/29/2019  7:08 AM EDT ----- Results noted. 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 Medical therapy is indicated. If unable to take statin (known to have intolerance), please update lipid panel and refer to lipid clinic for consideration of PCSK9. Lab reviewed from Feb 2020 and he was close to goal with LDL 78 mg/dL, but not sure what he was taking at that time (Crestor 5 mg 3 days/week)?

## 2019-04-30 NOTE — Telephone Encounter (Signed)
Reviewed results with patient who verbalized understanding.   Order placed for FLP to be drawn at [facility]. Will review results with Lipid Clinic once received.

## 2019-05-01 ENCOUNTER — Other Ambulatory Visit: Payer: Self-pay

## 2019-05-01 ENCOUNTER — Other Ambulatory Visit: Payer: Self-pay | Admitting: Radiology

## 2019-05-01 DIAGNOSIS — N200 Calculus of kidney: Secondary | ICD-10-CM

## 2019-05-05 ENCOUNTER — Encounter: Payer: Self-pay | Admitting: Urology

## 2019-05-05 ENCOUNTER — Other Ambulatory Visit
Admission: RE | Admit: 2019-05-05 | Discharge: 2019-05-05 | Disposition: A | Discharge status: Home / Self Care | Payer: Medicare Other | Attending: Urology | Admitting: Urology

## 2019-05-05 ENCOUNTER — Ambulatory Visit (INDEPENDENT_AMBULATORY_CARE_PROVIDER_SITE_OTHER): Payer: Medicare Other | Admitting: Urology

## 2019-05-05 ENCOUNTER — Other Ambulatory Visit: Payer: Self-pay

## 2019-05-05 VITALS — BP 140/77 | HR 88 | Ht 66.0 in | Wt 160.0 lb

## 2019-05-05 DIAGNOSIS — N200 Calculus of kidney: Secondary | ICD-10-CM

## 2019-05-05 LAB — URINALYSIS, COMPLETE (UACMP) WITH MICROSCOPIC
Bacteria, UA: NONE SEEN
Bilirubin Urine: NEGATIVE
Glucose, UA: 500 mg/dL — AB
Ketones, ur: NEGATIVE mg/dL
Leukocytes,Ua: NEGATIVE
Nitrite: NEGATIVE
Protein, ur: NEGATIVE mg/dL
Specific Gravity, Urine: 1.01 (ref 1.005–1.030)
Squamous Epithelial / HPF: NONE SEEN (ref 0–5)
pH: 5.5 (ref 5.0–8.0)

## 2019-05-05 NOTE — Patient Instructions (Signed)
Start litholyte packets 2-3 daily to prevent kidney stones. These can be purchased at [email or web address] or [email or web address], and do not require a prescription.   Lithotripsy  Lithotripsy is a treatment that can sometimes help eliminate kidney stones and the pain that they cause. A form of lithotripsy, also known as extracorporeal shock wave lithotripsy, is a nonsurgical procedure that crushes a kidney stone with shock waves. These shock waves pass through your body and focus on the kidney stone. They cause the kidney stone to break up while it is still in the urinary tract. This makes it easier for the smaller pieces of stone to pass in the urine. Tell a health care provider about:  Any allergies you have.  All medicines you are taking, including vitamins, herbs, eye drops, creams, and over-the-counter medicines.  Any blood disorders you have.  Any surgeries you have had.  Any medical conditions you have.  Whether you are pregnant or may be pregnant.  Any problems you or family members have had with anesthetic medicines. What are the risks? Generally, this is a safe procedure. However, problems may occur, including:  Infection.  Bleeding of the kidney.  Bruising of the kidney or skin.  Scarring of the kidney, which can lead to: ? Increased blood pressure. ? Poor kidney function. ? Return (recurrence) of kidney stones.  Damage to other structures or organs, such as the liver, colon, spleen, or pancreas.  Blockage (obstruction) of the the tube that carries urine from the kidney to the bladder (ureter).  Failure of the kidney stone to break into pieces (fragments). What happens before the procedure? Staying hydrated Follow instructions from your health care provider about hydration, which may include:  Up to 2 hours before the procedure - you may continue to drink clear liquids, such as water, clear fruit juice, black coffee, and plain tea. Eating and drinking restrictions Follow  instructions from your health care provider about eating and drinking, which may include:  8 hours before the procedure - stop eating heavy meals or foods such as meat, fried foods, or fatty foods.  6 hours before the procedure - stop eating light meals or foods, such as toast or cereal.  6 hours before the procedure - stop drinking milk or drinks that contain milk.  2 hours before the procedure - stop drinking clear liquids. General instructions  Plan to have someone take you home from the hospital or clinic.  Ask your health care provider about: ? Changing or stopping your regular medicines. This is especially important if you are taking diabetes medicines or blood thinners. ? Taking medicines such as aspirin and ibuprofen. These medicines and other NSAIDs can thin your blood. Do not take these medicines for 7 days before your procedure if your health care provider instructs you not to.  You may have tests, such as: ? Blood tests. ? Urine tests. ? Imaging tests, such as a CT scan. What happens during the procedure?  To lower your risk of infection: ? Your health care team will wash or sanitize their hands. ? Your skin will be washed with soap.  An IV tube will be inserted into one of your veins. This tube will give you fluids and medicines.  You will be given one or more of the following: ? A medicine to help you relax (sedative). ? A medicine to make you fall asleep (general anesthetic).  A water-filled cushion may be placed behind your kidney or on your abdomen. In some cases you may  be placed in a tub of lukewarm water.  Your body will be positioned in a way that makes it easy to target the kidney stone.  A flexible tube with holes in it (stent) may be placed in the ureter. This will help keep urine flowing from the kidney if the fragments of the stone have been blocking the ureter.  An X-ray or ultrasound exam will be done to locate your stone.  Shock waves will be  aimed at the stone. If you are awake, you may feel a tapping sensation as the shock waves pass through your body. The procedure may vary among health care providers and hospitals. What happens after the procedure?  You may have an X-ray to see whether the procedure was able to break up the kidney stone and how much of the stone has passed. If large stone fragments remain after treatment, you may need to have a second procedure at a later time.  Your blood pressure, heart rate, breathing rate, and blood oxygen level will be monitored until the medicines you were given have worn off.  You may be given antibiotics or pain medicine as needed.  If a stent was placed in your ureter during surgery, it may stay in place for a few weeks.  You may need strain your urine to collect pieces of the kidney stone for testing.  You will need to drink plenty of water.  Do not drive for 24 hours if you were given a sedative. Summary  Lithotripsy is a treatment that can sometimes help eliminate kidney stones and the pain that they cause.  A form of lithotripsy, also known as extracorporeal shock wave lithotripsy, is a nonsurgical procedure that crushes a kidney stone with shock waves.  Generally, this is a safe procedure. However, problems may occur, including damage to the kidney or other organs, infection, or obstruction of the tube that carries urine from the kidney to the bladder (ureter).  When you go home, you will need to drink plenty of water. You may be asked to strain your urine to collect pieces of the kidney stone for testing. This information is not intended to replace advice given to you by your health care provider. Make sure you discuss any questions you have with your health care provider. Document Released: 10/05/2000 Document Revised: 01/19/2019 Document Reviewed: 08/29/2016 Elsevier Patient Education  2020 [employer].

## 2019-05-05 NOTE — Progress Notes (Signed)
05/05/2019 11:06 AM   [name] Slipper Lumbert Jr. [birth year] [id number]  Reason for visit: Follow up nephrolithiasis  HPI: I saw Mr. Kissick in urology clinic today for follow-up of nephrolithiasis.  He is a 48 year old male with extensive history of nephrolithiasis and spontaneously passed stones and shockwave lithotripsy in the past.  He is currently not having any symptoms of flank pain or groin pain, and has done well over the last 6 months.  Prior to the COVID-19 pandemic, we had tentatively plan for right shockwave lithotripsy for an enlarging 7 mm right lower pole stone, however this was delayed over the last 3 to 6 months in the setting of COVID.  He presents today to re-discuss elective options for managing his enlarging right-sided lower pole stone burden.  He has undergone successful shockwave lithotripsy in the past, and he would like to pursue this again.  He has a very strong family history of stones.   ROS: Please see flowsheet from today's date for complete review of systems.  Physical Exam: BP 140/77 (BP Location: Left Arm, Patient Position: Sitting)   Pulse 88   Ht 5\' 6"  (1.676 m)   Wt 160 lb (72.6 kg)   BMI 25.82 kg/m    Constitutional:  Alert and oriented, No acute distress. Respiratory: Normal respiratory effort, no increased work of breathing. GI: Abdomen is soft, nontender, nondistended, no abdominal masses GU: No CVA tenderness Skin: No rashes, bruises or suspicious lesions. Neurologic: Grossly intact, no focal deficits, moving all 4 extremities. Psychiatric: Normal mood and affect  Laboratory Data: Urinalysis today pending  Pertinent Imaging: I have personally reviewed the KUB from 04/21/19: Right 22mm lower pole stone, left 69mm lower pole stone.  Assessment & Plan:   In summary, the patient is a 48 year old male with extensive history of nephrolithiasis who presents with an enlarging right-sided 7 mm lower pole stone.  We discussed various treatment  options for urolithiasis including observation with or without medical expulsive therapy, shockwave lithotripsy (SWL), ureteroscopy and laser lithotripsy with stent placement, and percutaneous nephrolithotomy.  We discussed that management is based on stone size, location, density, patient co-morbidities, and patient preference.   Stones <41mm in size have a >80% spontaneous passage rate. Data surrounding the use of tamsulosin for medical expulsive therapy is controversial, but meta analyses suggests it is most efficacious for distal stones between 5-88mm in size. Possible side effects include dizziness/lightheadedness, and retrograde ejaculation.  SWL has a lower stone free rate in a single procedure, but also a lower complication rate compared to ureteroscopy and avoids a stent and associated stent related symptoms. Possible complications include renal hematoma, steinstrasse, and need for additional treatment.  Ureteroscopy with laser lithotripsy and stent placement has a higher stone free rate than SWL in a single procedure, however increased complication rate including possible infection, ureteral injury, bleeding, and stent related morbidity. Common stent related symptoms include dysuria, urgency/frequency, and flank pain.   After an extensive discussion of the risks and benefits of the above treatment options, the patient would like to proceed with right SWL.  We discussed general stone prevention strategies including adequate hydration with goal of producing 2.5 L of urine daily, increasing citric acid intake, increasing calcium intake during high oxalate meals, minimizing animal protein, and decreasing salt intake. Information about dietary recommendations given today.  I also recommended starting litholyte packets 2-3 times daily for stone prevention.  Schedule right SWL for 80mm lower pole stone Consider left SWL in the future if good result  with R SWL and patient desires intervention  A  total of 15 minutes were spent face-to-face with the patient, greater than 50% was spent in patient education, counseling, and coordination of care regarding nephrolithiasis and treatment options.   [name], [facility] [address] [place],  [address] [phone]

## 2019-05-06 ENCOUNTER — Other Ambulatory Visit: Payer: Self-pay | Admitting: Radiology

## 2019-05-06 ENCOUNTER — Telehealth: Payer: Self-pay | Admitting: Radiology

## 2019-05-06 DIAGNOSIS — N200 Calculus of kidney: Secondary | ICD-10-CM

## 2019-05-06 LAB — URINE CULTURE: Culture: NO GROWTH

## 2019-05-06 NOTE — Telephone Encounter (Signed)
Notified patient of extracorporeal shockwave lithotripsy scheduled on 05/14/2019 at 6:00 am. Patient should arrive at that time to Same Day Surgery. Pre-op & discharge instructions for lithotripsy were provided to patient.    Advised patient to hold NSAIDS & aspirin products for 3 days prior to the procedure beginning on 05/11/2019.  Patient's questions were answered & he expresses understanding of these instructions.    Ranell Patrick, RN

## 2019-05-07 DIAGNOSIS — M791 Myalgia, unspecified site: Secondary | ICD-10-CM | POA: Diagnosis not present

## 2019-05-13 ENCOUNTER — Other Ambulatory Visit: Payer: Self-pay

## 2019-05-13 ENCOUNTER — Other Ambulatory Visit
Admission: RE | Admit: 2019-05-13 | Discharge: 2019-05-13 | Disposition: A | Discharge status: Home / Self Care | Payer: Medicare Other | Source: Ambulatory Visit | Attending: Urology | Admitting: Urology

## 2019-05-13 DIAGNOSIS — Z1159 Encounter for screening for other viral diseases: Secondary | ICD-10-CM | POA: Diagnosis present

## 2019-05-13 LAB — SARS CORONAVIRUS 2 (TAT 6-24 HRS): SARS Coronavirus 2: NEGATIVE

## 2019-05-14 ENCOUNTER — Ambulatory Visit
Admission: RE | Admit: 2019-05-14 | Discharge: 2019-05-14 | Disposition: A | Discharge status: Home / Self Care | Payer: Medicare Other | Attending: Urology | Admitting: Urology

## 2019-05-14 ENCOUNTER — Ambulatory Visit: Payer: Medicare Other

## 2019-05-14 ENCOUNTER — Encounter
Admission: RE | Disposition: A | Discharge status: Home / Self Care | Payer: Self-pay | Source: Home / Self Care | Attending: Urology

## 2019-05-14 ENCOUNTER — Encounter: Payer: Self-pay | Admitting: *Deleted

## 2019-05-14 DIAGNOSIS — N200 Calculus of kidney: Secondary | ICD-10-CM | POA: Diagnosis not present

## 2019-05-14 DIAGNOSIS — E119 Type 2 diabetes mellitus without complications: Secondary | ICD-10-CM | POA: Insufficient documentation

## 2019-05-14 HISTORY — PX: EXTRACORPOREAL SHOCK WAVE LITHOTRIPSY: SHX1557

## 2019-05-14 LAB — GLUCOSE, CAPILLARY: Glucose-Capillary: 134 mg/dL — ABNORMAL HIGH (ref 70–99)

## 2019-05-14 SURGERY — LITHOTRIPSY, ESWL
Anesthesia: Moderate Sedation | Laterality: Right

## 2019-05-14 MED ORDER — DIAZEPAM 5 MG PO TABS
10.0000 mg | ORAL_TABLET | ORAL | Status: AC
  Administered 2019-05-14: 10 mg via ORAL

## 2019-05-14 MED ORDER — DIPHENHYDRAMINE HCL 25 MG PO CAPS
25.0000 mg | ORAL_CAPSULE | ORAL | Status: AC
  Administered 2019-05-14: 25 mg via ORAL

## 2019-05-14 MED ORDER — DIAZEPAM 5 MG PO TABS
ORAL_TABLET | ORAL | Status: AC
  Administered 2019-05-14: 10 mg via ORAL
  Filled 2019-05-14: qty 2

## 2019-05-14 MED ORDER — ONDANSETRON HCL 4 MG/2ML IJ SOLN
INTRAMUSCULAR | Status: AC
  Administered 2019-05-14: 4 mg via INTRAVENOUS
  Filled 2019-05-14: qty 2

## 2019-05-14 MED ORDER — DIPHENHYDRAMINE HCL 25 MG PO CAPS
ORAL_CAPSULE | ORAL | Status: AC
  Administered 2019-05-14: 25 mg via ORAL
  Filled 2019-05-14: qty 1

## 2019-05-14 MED ORDER — CIPROFLOXACIN HCL 500 MG PO TABS
ORAL_TABLET | ORAL | Status: AC
  Administered 2019-05-14: 500 mg via ORAL
  Filled 2019-05-14: qty 1

## 2019-05-14 MED ORDER — SODIUM CHLORIDE 0.9 % IV SOLN
INTRAVENOUS | Status: DC
  Administered 2019-05-14: 07:00:00 via INTRAVENOUS

## 2019-05-14 MED ORDER — CIPROFLOXACIN HCL 500 MG PO TABS
500.0000 mg | ORAL_TABLET | ORAL | Status: AC
  Administered 2019-05-14: 500 mg via ORAL

## 2019-05-14 MED ORDER — ONDANSETRON HCL 4 MG/2ML IJ SOLN
4.0000 mg | Freq: Once | INTRAMUSCULAR | Status: AC | PRN
  Administered 2019-05-14: 4 mg via INTRAVENOUS

## 2019-05-14 MED ORDER — ONDANSETRON HCL 2 MG/ML IV SOLN: 4.0000 mg | Freq: Once | INTRAVENOUS | Status: DC | PRN

## 2019-05-14 NOTE — H&P (Signed)
UROLOGY H&P UPDATE  Agree with prior H&P dated 7/14. Here for elective R SWL for 44mm lower pole stone.  Cardiac: RRR Lungs: CTA bilaterally  Laterality: RIGHT Procedure: RIGHT SWL  Urine: 7/14 culture no growth  Informed consent obtained, we specifically discussed the risks of bleeding, infection, post-operative pain, need for additional procedures, steinstrasse, and possible obstructing fragments.  [name], MD 05/14/2019

## 2019-05-14 NOTE — Discharge Instructions (Signed)

## 2019-05-19 DIAGNOSIS — R200 Anesthesia of skin: Secondary | ICD-10-CM | POA: Diagnosis not present

## 2019-05-19 DIAGNOSIS — G4440 Drug-induced headache, not elsewhere classified, not intractable: Secondary | ICD-10-CM | POA: Diagnosis not present

## 2019-05-20 ENCOUNTER — Other Ambulatory Visit: Payer: Self-pay | Admitting: Gastroenterology

## 2019-05-20 DIAGNOSIS — R1012 Left upper quadrant pain: Secondary | ICD-10-CM | POA: Diagnosis not present

## 2019-05-20 DIAGNOSIS — R198 Other specified symptoms and signs involving the digestive system and abdomen: Secondary | ICD-10-CM | POA: Diagnosis not present

## 2019-05-20 DIAGNOSIS — K2950 Unspecified chronic gastritis without bleeding: Secondary | ICD-10-CM | POA: Diagnosis not present

## 2019-05-25 ENCOUNTER — Other Ambulatory Visit: Payer: Medicare Other

## 2019-06-02 ENCOUNTER — Encounter: Payer: Self-pay | Admitting: Urology

## 2019-06-02 ENCOUNTER — Other Ambulatory Visit: Payer: Self-pay

## 2019-06-02 ENCOUNTER — Ambulatory Visit
Admission: RE | Admit: 2019-06-02 | Discharge: 2019-06-02 | Disposition: A | Discharge status: Home / Self Care | Payer: Medicare Other | Attending: Urology | Admitting: Urology

## 2019-06-02 ENCOUNTER — Ambulatory Visit (INDEPENDENT_AMBULATORY_CARE_PROVIDER_SITE_OTHER): Payer: Medicare Other | Admitting: Urology

## 2019-06-02 ENCOUNTER — Ambulatory Visit
Admission: RE | Admit: 2019-06-02 | Discharge: 2019-06-02 | Disposition: A | Discharge status: Home / Self Care | Payer: Medicare Other | Source: Ambulatory Visit | Attending: Urology | Admitting: Urology

## 2019-06-02 VITALS — BP 110/75 | HR 100 | Ht 66.0 in | Wt 160.0 lb

## 2019-06-02 DIAGNOSIS — N200 Calculus of kidney: Secondary | ICD-10-CM

## 2019-06-02 NOTE — Patient Instructions (Signed)

## 2019-06-02 NOTE — Progress Notes (Signed)
   06/02/2019 2:09 PM   [place]. 1971/08/01 [id number]  Reason for visit: Follow up right shockwave lithotripsy  HPI: I saw [name] back in urology clinic for follow-up of nephrolithiasis.  He is a 48 year old male with extensive history of recurrent stone disease, who most recently underwent right shockwave lithotripsy for a 7 mm nonobstructing right lower pole stone.  He had excellent fragmentation and passed numerous small fragments.  He denies any pain or gross hematuria.  His x-ray today shows complete fragmentation of the right lower pole stone.  His remaining stone burden is a 4 mm left lower pole stone that is stable.   ROS: Please see flowsheet from today's date for complete review of systems.  Physical Exam: BP 110/75   Pulse 100   Ht 5\' 6"  (1.676 m)   Wt 160 lb (72.6 kg)   BMI 25.82 kg/m    Pertinent Imaging: I have personally reviewed the KUB today.  Complete fragmentation of right lower pole stone.  Stable left 4 mm lower pole stone.  Assessment & Plan:   In summary, the patient is a 48 year old male with recurrent stone disease, status post successful right shockwave lithotripsy for a 7 mm nonobstructing lower pole stone.  Stone analysis is pending.  We discussed general stone prevention strategies including adequate hydration with goal of producing 2.5 L of urine daily, increasing citric acid intake, increasing calcium intake during high oxalate meals, minimizing animal protein, and decreasing salt intake. Information about dietary recommendations given today.  I also recommended supplementing with citrate with litholyte 2 to 3 packets daily.  RTC one year with KUB, consider left SWL if enlarging left lower pole stone Follow up stone analysis  A total of 15 minutes were spent face-to-face with the patient, greater than 50% was spent in patient education, counseling, and coordination of care regarding nephrolithiasis and stone prevention.   [name], [facility] [address], [place], [place] [address] [phone]

## 2019-06-03 ENCOUNTER — Other Ambulatory Visit: Payer: Self-pay

## 2019-06-03 ENCOUNTER — Encounter
Admission: RE | Admit: 2019-06-03 | Discharge: 2019-06-03 | Disposition: A | Discharge status: Home / Self Care | Payer: Medicare Other | Source: Ambulatory Visit | Attending: Gastroenterology | Admitting: Gastroenterology

## 2019-06-03 DIAGNOSIS — R1012 Left upper quadrant pain: Secondary | ICD-10-CM | POA: Diagnosis not present

## 2019-06-03 DIAGNOSIS — R109 Unspecified abdominal pain: Secondary | ICD-10-CM | POA: Diagnosis not present

## 2019-06-03 MED ORDER — TECHNETIUM TC 99M SULFUR COLLOID
2.0000 | Freq: Once | INTRAVENOUS | Status: AC | PRN
  Administered 2019-06-03: 08:00:00 3.26 via INTRAVENOUS

## 2019-06-10 ENCOUNTER — Other Ambulatory Visit: Payer: Self-pay | Admitting: Urology

## 2019-06-11 ENCOUNTER — Telehealth: Payer: Self-pay

## 2019-06-11 NOTE — Telephone Encounter (Signed)
-----   Message from [name], MD sent at 06/11/2019  9:00 AM EDT -----   ----- Message ----- From: Noralyn Pick Sent: 06/10/2019   5:57 PM EDT To: [name], MD

## 2019-06-11 NOTE — Progress Notes (Signed)
Stone was calcium oxalate, the most common type of stone. Continue prevention as discussed in clinic  Nickolas Madrid, MD 06/11/2019

## 2019-06-11 NOTE — Telephone Encounter (Signed)
Stone was calcium oxalate, the most common type of stone. Continue prevention as discussed in clinic  Nickolas Madrid, MD 06/11/2019  mychart sent

## 2019-06-16 DIAGNOSIS — E7800 Pure hypercholesterolemia, unspecified: Secondary | ICD-10-CM | POA: Diagnosis not present

## 2019-06-16 DIAGNOSIS — Z0000 Encounter for general adult medical examination without abnormal findings: Secondary | ICD-10-CM | POA: Diagnosis not present

## 2019-06-16 DIAGNOSIS — E119 Type 2 diabetes mellitus without complications: Secondary | ICD-10-CM | POA: Diagnosis not present

## 2019-06-16 DIAGNOSIS — K219 Gastro-esophageal reflux disease without esophagitis: Secondary | ICD-10-CM | POA: Diagnosis not present

## 2019-06-16 DIAGNOSIS — M2550 Pain in unspecified joint: Secondary | ICD-10-CM | POA: Diagnosis not present

## 2019-06-16 DIAGNOSIS — Z1331 Encounter for screening for depression: Secondary | ICD-10-CM | POA: Diagnosis not present

## 2019-06-17 DIAGNOSIS — K580 Irritable bowel syndrome with diarrhea: Secondary | ICD-10-CM | POA: Diagnosis not present

## 2019-06-17 DIAGNOSIS — K210 Gastro-esophageal reflux disease with esophagitis: Secondary | ICD-10-CM | POA: Diagnosis not present

## 2019-06-19 DIAGNOSIS — E1159 Type 2 diabetes mellitus with other circulatory complications: Secondary | ICD-10-CM | POA: Diagnosis not present

## 2019-06-19 DIAGNOSIS — E119 Type 2 diabetes mellitus without complications: Secondary | ICD-10-CM | POA: Diagnosis not present

## 2019-06-19 DIAGNOSIS — E1169 Type 2 diabetes mellitus with other specified complication: Secondary | ICD-10-CM | POA: Diagnosis not present

## 2019-06-19 DIAGNOSIS — E291 Testicular hypofunction: Secondary | ICD-10-CM | POA: Diagnosis not present

## 2019-06-30 DIAGNOSIS — M47814 Spondylosis without myelopathy or radiculopathy, thoracic region: Secondary | ICD-10-CM | POA: Diagnosis not present

## 2019-06-30 DIAGNOSIS — M791 Myalgia, unspecified site: Secondary | ICD-10-CM | POA: Diagnosis not present

## 2019-06-30 DIAGNOSIS — G894 Chronic pain syndrome: Secondary | ICD-10-CM | POA: Diagnosis not present

## 2019-06-30 DIAGNOSIS — Z79899 Other long term (current) drug therapy: Secondary | ICD-10-CM | POA: Diagnosis not present

## 2019-06-30 DIAGNOSIS — M5412 Radiculopathy, cervical region: Secondary | ICD-10-CM | POA: Diagnosis not present

## 2019-07-06 DIAGNOSIS — R200 Anesthesia of skin: Secondary | ICD-10-CM | POA: Diagnosis not present

## 2019-07-06 DIAGNOSIS — R51 Headache: Secondary | ICD-10-CM | POA: Diagnosis not present

## 2019-07-06 DIAGNOSIS — R519 Headache, unspecified: Secondary | ICD-10-CM | POA: Insufficient documentation

## 2019-07-20 ENCOUNTER — Ambulatory Visit: Payer: Medicare Other | Admitting: Urology

## 2019-08-18 DIAGNOSIS — K219 Gastro-esophageal reflux disease without esophagitis: Secondary | ICD-10-CM | POA: Diagnosis not present

## 2019-08-18 DIAGNOSIS — R05 Cough: Secondary | ICD-10-CM | POA: Diagnosis not present

## 2019-08-26 DIAGNOSIS — M7552 Bursitis of left shoulder: Secondary | ICD-10-CM | POA: Diagnosis not present

## 2019-08-26 DIAGNOSIS — E119 Type 2 diabetes mellitus without complications: Secondary | ICD-10-CM | POA: Diagnosis not present

## 2019-08-28 DIAGNOSIS — E291 Testicular hypofunction: Secondary | ICD-10-CM | POA: Diagnosis not present

## 2019-09-28 DIAGNOSIS — E119 Type 2 diabetes mellitus without complications: Secondary | ICD-10-CM | POA: Diagnosis not present

## 2019-09-28 DIAGNOSIS — M7552 Bursitis of left shoulder: Secondary | ICD-10-CM | POA: Diagnosis not present

## 2019-09-28 DIAGNOSIS — M25312 Other instability, left shoulder: Secondary | ICD-10-CM | POA: Diagnosis not present

## 2019-10-06 DIAGNOSIS — M542 Cervicalgia: Secondary | ICD-10-CM | POA: Diagnosis not present

## 2019-10-06 DIAGNOSIS — G4700 Insomnia, unspecified: Secondary | ICD-10-CM | POA: Diagnosis not present

## 2019-10-06 DIAGNOSIS — G43719 Chronic migraine without aura, intractable, without status migrainosus: Secondary | ICD-10-CM | POA: Diagnosis not present

## 2019-10-08 ENCOUNTER — Other Ambulatory Visit: Payer: Self-pay

## 2019-10-08 DIAGNOSIS — N200 Calculus of kidney: Secondary | ICD-10-CM

## 2019-10-09 ENCOUNTER — Ambulatory Visit (INDEPENDENT_AMBULATORY_CARE_PROVIDER_SITE_OTHER): Payer: Medicare Other | Admitting: Physician Assistant

## 2019-10-09 ENCOUNTER — Other Ambulatory Visit: Payer: Self-pay

## 2019-10-09 ENCOUNTER — Other Ambulatory Visit
Admission: RE | Admit: 2019-10-09 | Discharge: 2019-10-09 | Disposition: A | Discharge status: Home / Self Care | Payer: Medicare Other | Source: Ambulatory Visit | Attending: Physician Assistant | Admitting: Physician Assistant

## 2019-10-09 ENCOUNTER — Ambulatory Visit
Admission: RE | Admit: 2019-10-09 | Discharge: 2019-10-09 | Disposition: A | Discharge status: Home / Self Care | Payer: Medicare Other | Source: Ambulatory Visit | Attending: Physician Assistant | Admitting: Physician Assistant

## 2019-10-09 ENCOUNTER — Encounter: Payer: Self-pay | Admitting: Physician Assistant

## 2019-10-09 VITALS — BP 136/87 | HR 128 | Ht 66.0 in | Wt 155.0 lb

## 2019-10-09 DIAGNOSIS — R109 Unspecified abdominal pain: Secondary | ICD-10-CM

## 2019-10-09 DIAGNOSIS — N200 Calculus of kidney: Secondary | ICD-10-CM | POA: Diagnosis not present

## 2019-10-09 DIAGNOSIS — Z87442 Personal history of urinary calculi: Secondary | ICD-10-CM | POA: Diagnosis not present

## 2019-10-09 LAB — URINALYSIS, COMPLETE
Bilirubin, UA: NEGATIVE
Leukocytes,UA: NEGATIVE
Nitrite, UA: NEGATIVE
Protein,UA: NEGATIVE
Specific Gravity, UA: 1.02 (ref 1.005–1.030)
Urobilinogen, Ur: 0.2 mg/dL (ref 0.2–1.0)
pH, UA: 5.5 (ref 5.0–7.5)

## 2019-10-09 LAB — MICROSCOPIC EXAMINATION: Bacteria, UA: NONE SEEN

## 2019-10-09 NOTE — Progress Notes (Signed)
10/09/2019 9:23 AM   [name] Slipper Epstein Jr. [birth year] [id number]  CC: Left flank pain  HPI: [name] [facility] is a 48 y.o. male who presents to the clinic for evaluation of possible acute stone episode.  PMH significant for recurrent nephrolithiasis, most recently with right ESWL for 7 mm nonobstructing right lower pole stone on 05/14/2019.  Follow-up KUB revealed resolution of the right-sided stone with left nephrolithiasis featuring a stable 4 mm left lower pole stone as well as a left upper pole stone.  He reports symptom onset approximately 2 weeks ago. He reports intermittent left flank pain. He denies fever, chills, nausea, vomiting, and gross hematuria.  He has not taken anything at home for treatment of the pain.  He did start Flomax 2 days ago, suspecting that this was a stone.  Of note, he reports passing another stone approximately 2 to 3 months ago that was approximately 3 mm in diameter.  He believed that this had been the left lower pole stone until he developed his left-sided symptoms earlier this month.  Today's KUB revealed migration of the 4 mm left lower pole stone medially, likely representing ureteral positioning. In-office UA today positive for trace-intact blood; urine microscopy with 3-10 RBCs/HPF.  PMH: Past Medical History:  Diagnosis Date  . Chronic kidney disease    H/O KIDNEY STONES  . Chronic pain   . Diabetes mellitus without complication ([place])   . DJD (degenerative joint disease)   . GERD (gastroesophageal reflux disease)   . Hyperlipidemia   . Hypertension   . Hypogonadism male     Surgical History: Past Surgical History:  Procedure Laterality Date  . COLONOSCOPY WITH PROPOFOL N/A 01/05/2019   Procedure: COLONOSCOPY WITH PROPOFOL;  Surgeon: Lollie Sails, MD;  Location: [facility] ENDOSCOPY;  Service: Endoscopy;  Laterality: N/A;  . ESOPHAGOGASTRODUODENOSCOPY (EGD) WITH PROPOFOL N/A 01/05/2019   Procedure: ESOPHAGOGASTRODUODENOSCOPY (EGD) WITH  PROPOFOL;  Surgeon: Lollie Sails, MD;  Location: [facility] ENDOSCOPY;  Service: Endoscopy;  Laterality: N/A;  . EXTRACORPOREAL SHOCK WAVE LITHOTRIPSY Right 05/14/2019   Procedure: EXTRACORPOREAL SHOCK WAVE LITHOTRIPSY (ESWL);  Surgeon: [name], MD;  Location: [facility] ORS;  Service: Urology;  Laterality: Right;  . KNEE ARTHROSCOPY    . KNEE ARTHROSCOPY WITH MENISCAL REPAIR Right 01/19/2016   Procedure: KNEE ARTHROSCOPY WITH MENISCAL REPAIR;  Surgeon: Corky Mull, MD;  Location: [facility] ORS;  Service: Orthopedics;  Laterality: Right;  . KNEE ARTHROSCOPY WITH MENISCAL REPAIR Right 06/12/2016   Procedure: KNEE ARTHROSCOPY WITH PARTIAL MEDIAL MENISCAL REPAIR;  Surgeon: Corky Mull, MD;  Location: [facility] ORS;  Service: Orthopedics;  Laterality: Right;  . PARATHYROIDECTOMY    . SHOULDER SURGERY Bilateral   . SPINAL CORD STIMULATOR BATTERY EXCHANGE    . SPINAL CORD STIMULATOR IMPLANT  2008  . SPINAL FUSION     L4-L5 (2004), L5-S1 (2006)  . THYROIDECTOMY, PARTIAL    . TONSILLECTOMY      Home Medications:  Allergies as of 10/09/2019      Reactions   Pravastatin Other (See Comments)   ELEVATED LFT'S   Celexa [citalopram] Rash      Medication List       Accurate as of October 09, 2019  9:23 AM. If you have any questions, ask your nurse or doctor.        acyclovir 200 MG capsule Commonly known as: ZOVIRAX Take 200 mg by mouth as needed.   Ajovy 225 MG/1.5ML Sosy Generic drug: Fremanezumab-vfrm Inject 225 mg subcutaneously  every 28 (twenty-eight) days   cholecalciferol 1000 units tablet Commonly known as: VITAMIN D Take 1,000 Units by mouth daily.   clomiPHENE 50 MG tablet Commonly known as: CLOMID TAKE 0.5 TABLETS (25 MG TOTAL) BY MOUTH ONCE DAILY   HYDROmorphone 4 MG tablet Commonly known as: DILAUDID Take 4 mg by mouth every 4 (four) hours as needed for severe pain.   hydrOXYzine 10 MG tablet Commonly known as: ATARAX/VISTARIL Take 10 mg by mouth 3 (three) times daily as  needed.   lisinopril 10 MG tablet Commonly known as: ZESTRIL Take 10 mg by mouth every morning.   metFORMIN 500 MG tablet Commonly known as: GLUCOPHAGE Take 500 mg by mouth 2 (two) times daily with a meal.   OxyCONTIN 10 mg 12 hr tablet Generic drug: oxyCODONE Take 10 mg by mouth every 12 (twelve) hours.   pantoprazole 40 MG tablet Commonly known as: PROTONIX Take 40 mg by mouth daily.   pregabalin 75 MG capsule Commonly known as: LYRICA Take 75 mg by mouth 2 (two) times daily.   tamsulosin 0.4 MG Caps capsule Commonly known as: FLOMAX Take 1 capsule (0.4 mg total) by mouth daily.   testosterone cypionate 200 MG/ML injection Commonly known as: DEPOTESTOSTERONE CYPIONATE Inject 200 mg into the muscle every 7 (seven) days.   traZODone 50 MG tablet Commonly known as: DESYREL Take 25 mg by mouth at bedtime as needed for sleep.       Allergies:  Allergies  Allergen Reactions  . Pravastatin Other (See Comments)    ELEVATED LFT'S  . Celexa [Citalopram] Rash    Family History: Family History  Problem Relation Age of Onset  . Bladder Cancer Neg Hx   . Kidney cancer Neg Hx   . Prostate cancer Neg Hx     Social History:  reports that he has never smoked. He has never used smokeless tobacco. He reports that he does not drink alcohol or use drugs.  ROS: UROLOGY Frequent Urination?: No Hard to postpone urination?: No Burning/pain with urination?: No Get up at night to urinate?: No Leakage of urine?: No Urine stream starts and stops?: No Trouble starting stream?: No Do you have to strain to urinate?: No Blood in urine?: No Urinary tract infection?: No Sexually transmitted disease?: No Injury to kidneys or bladder?: No Painful intercourse?: No Weak stream?: No Erection problems?: No Penile pain?: No  Gastrointestinal Nausea?: No Vomiting?: No Indigestion/heartburn?: No Diarrhea?: No Constipation?: No  Constitutional Fever: No Night sweats?: No  Weight loss?: No Fatigue?: No  Skin Skin rash/lesions?: No Itching?: No  Eyes Blurred vision?: No Double vision?: No  Ears/Nose/Throat Sore throat?: No Sinus problems?: No  Hematologic/Lymphatic Swollen glands?: No Easy bruising?: No  Cardiovascular Leg swelling?: No Chest pain?: No  Respiratory Cough?: No Shortness of breath?: No  Endocrine Excessive thirst?: No  Musculoskeletal Back pain?: Yes Joint pain?: Yes  Neurological Headaches?: Yes Dizziness?: No  Psychologic Depression?: No Anxiety?: No  Physical Exam: BP 136/87   Pulse (!) 128   Ht 5\' 6"  (1.676 m)   Wt 155 lb (70.3 kg)   BMI 25.02 kg/m   Constitutional:  Alert and oriented, No acute distress. HEENT: [place] AT, moist mucus membranes.  Trachea midline, no masses. Cardiovascular: No clubbing, cyanosis, or edema. Respiratory: Normal respiratory effort, no increased work of breathing. Skin: No rashes, bruises or suspicious lesions. Neurologic: Grossly intact, no focal deficits, moving all 4 extremities. Psychiatric: Normal mood and affect.  Laboratory Data: Results for orders placed  or performed in visit on 10/09/19  Microscopic Examination   URINE  Result Value Ref Range   WBC, UA 0-5 0 - 5 /hpf   RBC 3-10 (A) 0 - 2 /hpf   Epithelial Cells (non renal) 0-10 0 - 10 /hpf   Bacteria, UA None seen None seen/Few  Urinalysis, Complete  Result Value Ref Range   Specific Gravity, UA 1.020 1.005 - 1.030   pH, UA 5.5 5.0 - 7.5   Color, UA Yellow Yellow   Appearance Ur Clear Clear   Leukocytes,UA Negative Negative   Protein,UA Negative Negative/Trace   Glucose, UA 2+ (A) Negative   Ketones, UA Trace (A) Negative   RBC, UA Trace (A) Negative   Bilirubin, UA Negative Negative   Urobilinogen, Ur 0.2 0.2 - 1.0 mg/dL   Nitrite, UA Negative Negative   Microscopic Examination See below:    Pertinent Imaging: Results for orders placed during the hospital encounter of 10/09/19  Abdomen 1 view (KUB)    Narrative CLINICAL DATA:  Nephrolithiasis.  Right flank pain.  EXAM: ABDOMEN - 1 VIEW  COMPARISON:  June 02, 2019.  FINDINGS: The bowel gas pattern is normal. Stable calculus seen projected over the upper pole of the left kidney. There is a new calculus seen to the left of the L3-4 disc space concerning for ureteral calculus. Postsurgical changes are seen in the lower lumbar spine. No right nephrolithiasis is noted.  IMPRESSION: Probable ureteral calculus seen to the left of the L3-4 disc space. CT urogram is recommended for further evaluation.   Electronically Signed   By: Marijo Conception M.D.   On: 10/09/2019 09:12    I personally reviewed the images referenced above and no migration of the previously identified 4 mm left lower pole stone medially, likely representing passage into the proximal ureter.  Assessment & Plan:   1. Flank pain with history of urolithiasis 48 year old male with a history of recurrent nephrolithiasis, most recently undergoing ESWL for nonobstructing right renal stone earlier this year, now with a 2-week history of intermittent left flank pain.  On KUB, the previous identified 4 mm left lower pole stone has migrated medially, likely representing ureteral positioning.  UA reassuring for infection.  Patient is comfortable in office today.  He has not required pain medication for management.  I recommend trial of passage at this time with follow-up KUB in 1 month.  He is in agreement with this plan.  I counseled him to continue daily Flomax, push fluids, use Tylenol and ibuprofen as needed for pain relief, and strain his urine.  He denies nausea and vomiting; no Zofran needed.  He has previously been seen by the pain clinic, terms of pain contract are unclear.  He states he still has pain medication prescribed by the pain clinic, so I will defer narcotics today.    Counseled patient to contact the office if he develops fever, chills, nausea, vomiting, or  intractable pain in the interim.  He expressed understanding. - Urinalysis, Complete - DG Abd 1 View; Future   Return in about 4 weeks (around 11/06/2019) for Stone f/u, KUB prior.   Debroah Loop, PA-C  [facility] [address] [place], [place] [address] [phone]

## 2019-10-14 ENCOUNTER — Telehealth: Payer: Self-pay | Admitting: Physician Assistant

## 2019-10-14 NOTE — Telephone Encounter (Signed)
Spoke to patient and he is at [employer] now. I informed him that he will need to go to the ER or Urgent care to be evaluated.

## 2019-10-14 NOTE — Telephone Encounter (Signed)
Pt called office and isn't sure if he has a UTI or not.  He says his kidney stone has moved all week.  He has constant pain in penis and feels like he has to pee all the time.  He was wondering about an antibiotic to be taken since the holidays are coming up.

## 2019-10-29 DIAGNOSIS — R05 Cough: Secondary | ICD-10-CM | POA: Diagnosis not present

## 2019-10-29 DIAGNOSIS — J301 Allergic rhinitis due to pollen: Secondary | ICD-10-CM | POA: Diagnosis not present

## 2019-10-29 DIAGNOSIS — K219 Gastro-esophageal reflux disease without esophagitis: Secondary | ICD-10-CM | POA: Diagnosis not present

## 2019-11-02 DIAGNOSIS — E1159 Type 2 diabetes mellitus with other circulatory complications: Secondary | ICD-10-CM | POA: Diagnosis not present

## 2019-11-02 DIAGNOSIS — E785 Hyperlipidemia, unspecified: Secondary | ICD-10-CM | POA: Diagnosis not present

## 2019-11-02 DIAGNOSIS — E1169 Type 2 diabetes mellitus with other specified complication: Secondary | ICD-10-CM | POA: Diagnosis not present

## 2019-11-02 DIAGNOSIS — I10 Essential (primary) hypertension: Secondary | ICD-10-CM | POA: Diagnosis not present

## 2019-11-02 DIAGNOSIS — E291 Testicular hypofunction: Secondary | ICD-10-CM | POA: Diagnosis not present

## 2019-11-02 DIAGNOSIS — L02212 Cutaneous abscess of back [any part, except buttock]: Secondary | ICD-10-CM | POA: Diagnosis not present

## 2019-11-02 DIAGNOSIS — E119 Type 2 diabetes mellitus without complications: Secondary | ICD-10-CM | POA: Diagnosis not present

## 2019-11-02 DIAGNOSIS — R351 Nocturia: Secondary | ICD-10-CM | POA: Diagnosis not present

## 2019-11-04 NOTE — Progress Notes (Signed)
Cardiology Office Note:    Date:  11/05/2019   ID:  [name] Brown., DOB 06-Sep-1971, MRN [id number]  PCP:  [name] Hartigan, MD  Cardiologist:  No primary care provider on file.  Referring MD: [name] Hartigan, MD   Chief Complaint  Patient presents with  . Hypertension    History of Present Illness:    [name] Brown. is a 49 y.o. male with a past medical history significant for type 2 diabetes, hypertension, hyperlipidemia and strong family history of premature CAD in his male relatives including his father.  He has never smoked, although he had secondhand exposure when he was young.  The patient was first seen in the office on 03/03/2019 by Dr. Burt Brown for evaluation of coronary artery disease after incidental finding of coronary calcification on noncontrast CT scan.  He had an echocardiogram in 2019 that demonstrated normal LV systolic function with no significant valvular abnormalities.  A nuclear scan demonstrated good exercise tolerance with normal perfusion.  He was having some left-sided chest discomfort that he felt was like a muscle pull.  At visit on 03/03/2019  blood pressure was elevated at 176/100 with a heart rate of 91.  A review of various other office visits since that time indicates mostly well controlled blood pressure.  The patient sent a MyChart message on 10/27/2019 with complaints of elevated blood pressure.  Review of notes indicate that lisinopril was changed to losartan per endocrinology for persistent dry cough, but still has the cough.  He reported that his ENT used to scope to see that his esophagus was irritated and his cough could be related to reflux.  The patient is here today for further evaluation of his blood pressure. Home BP around 150/90 with HR often over 100. He feels his heart beating hard. He has been told several times recently that his HR is high. He denies chest pain/pressure, shortness of breath, orthopnea, PND, edema, palpitations,  lightheadedness or syncope.   He does low impact exercise due to orthopedic issues. He feels that he is quite active. He is retired but does regular exercise with elliptical and Bowflex. No exertional symptoms.   He continues to have intermittent non-productive cough for several days at a time for the last year. He is going to have allergy testing. He has been on protonix. His heart burn has been a little worse lately.   He does not feel that his cough improved at all with being off lisinopril.  He feels like the lisinopril better controlled his blood pressure over the losartan for metoprolol.  He is supposed to see a headache clinic but insurance has not approved it.    Cardiac studies   Myocardial perfusion scan 09/03/2018 LVEF= 58 % FINDINGS: Regional wall motion: reveals normal myocardial thickening and wall  motion. The overall quality of the study is good.  Artifacts noted: no Left ventricular cavity: normal.  Perfusion Analysis: SPECT images demonstrate homogeneous tracer  distribution throughout the myocardium.   Echo 09-09-2018: INTERPRETATION NORMAL LEFT VENTRICULAR SYSTOLIC FUNCTION WITH AN ESTIMATED EF = >55 % NORMAL RIGHT VENTRICULAR SYSTOLIC FUNCTION MILD MITRAL VALVE INSUFFICIENCY TRACE TRICUSPID VALVE INSUFFICIENCY NO VALVULAR STENOSIS  Past Medical History:  Diagnosis Date  . Chronic kidney disease    H/O KIDNEY STONES  . Chronic pain   . Diabetes mellitus without complication ([place])   . DJD (degenerative joint disease)   . GERD (gastroesophageal reflux disease)   . Hyperlipidemia   . Hypertension   .  Hypogonadism male     Past Surgical History:  Procedure Laterality Date  . COLONOSCOPY WITH PROPOFOL N/A 01/05/2019   Procedure: COLONOSCOPY WITH PROPOFOL;  Surgeon: [name] Sails, MD;  Location: [facility] ENDOSCOPY;  Service: Endoscopy;  Laterality: N/A;  . ESOPHAGOGASTRODUODENOSCOPY (EGD) WITH PROPOFOL N/A 01/05/2019   Procedure:  ESOPHAGOGASTRODUODENOSCOPY (EGD) WITH PROPOFOL;  Surgeon: [name] Sails, MD;  Location: [facility] ENDOSCOPY;  Service: Endoscopy;  Laterality: N/A;  . EXTRACORPOREAL SHOCK WAVE LITHOTRIPSY Right 05/14/2019   Procedure: EXTRACORPOREAL SHOCK WAVE LITHOTRIPSY (ESWL);  Surgeon: [name] Co, MD;  Location: [facility] ORS;  Service: Urology;  Laterality: Right;  . KNEE ARTHROSCOPY    . KNEE ARTHROSCOPY WITH MENISCAL REPAIR Right 01/19/2016   Procedure: KNEE ARTHROSCOPY WITH MENISCAL REPAIR;  Surgeon: [name] Mull, MD;  Location: [facility] ORS;  Service: Orthopedics;  Laterality: Right;  . KNEE ARTHROSCOPY WITH MENISCAL REPAIR Right 06/12/2016   Procedure: KNEE ARTHROSCOPY WITH PARTIAL MEDIAL MENISCAL REPAIR;  Surgeon: [name] Mull, MD;  Location: [facility] ORS;  Service: Orthopedics;  Laterality: Right;  . PARATHYROIDECTOMY    . SHOULDER SURGERY Bilateral   . SPINAL CORD STIMULATOR BATTERY EXCHANGE    . SPINAL CORD STIMULATOR IMPLANT  2008  . SPINAL FUSION     L4-L5 (2004), L5-S1 (2006)  . THYROIDECTOMY, PARTIAL    . TONSILLECTOMY      Current Medications: Current Meds  Medication Sig  . acyclovir (ZOVIRAX) 200 MG capsule Take 200 mg by mouth as needed.  . cholecalciferol (VITAMIN D) 1000 units tablet Take 1,000 Units by mouth daily.  . hydrOXYzine (ATARAX/VISTARIL) 10 MG tablet Take 10 mg by mouth 3 (three) times daily as needed.  . metFORMIN (GLUCOPHAGE) 500 MG tablet Take 500 mg by mouth 2 (two) times daily with a meal.  . metoprolol succinate (TOPROL-XL) 50 MG 24 hr tablet Take 50 mg by mouth daily.  . nortriptyline (PAMELOR) 10 MG capsule   . pantoprazole (PROTONIX) 40 MG tablet Take 40 mg by mouth daily.  . SUMAtriptan (IMITREX) 100 MG tablet as needed.  . tamsulosin (FLOMAX) 0.4 MG CAPS capsule Take 1 capsule (0.4 mg total) by mouth daily.  Marland Kitchen testosterone cypionate (DEPOTESTOSTERONE CYPIONATE) 200 MG/ML injection Inject 200 mg into the muscle every 7 (seven) days.  . traZODone (DESYREL) 50 MG  tablet Take 25 mg by mouth at bedtime as needed for sleep.   Marland Kitchen venlafaxine (EFFEXOR) 37.5 MG tablet Take 37.5 mg by mouth daily.     Allergies:   Pravastatin and Celexa [citalopram]   Social History   Socioeconomic History  . Marital status: Married    Spouse name: Not on file  . Number of children: Not on file  . Years of education: Not on file  . Highest education level: Not on file  Occupational History  . Not on file  Tobacco Use  . Smoking status: Never Smoker  . Smokeless tobacco: Never Used  Substance and Sexual Activity  . Alcohol use: No  . Drug use: No  . Sexual activity: Yes    Birth control/protection: None  Other Topics Concern  . Not on file  Social History Narrative  . Not on file   Social Determinants of Health   Financial Resource Strain:   . Difficulty of Paying Living Expenses: Not on file  Food Insecurity:   . Worried About Charity fundraiser in the Last Year: Not on file  . Ran Out of Food in the Last Year: Not on file  Transportation  Needs:   . Lack of Transportation (Medical): Not on file  . Lack of Transportation (Non-Medical): Not on file  Physical Activity:   . Days of Exercise per Week: Not on file  . Minutes of Exercise per Session: Not on file  Stress:   . Feeling of Stress : Not on file  Social Connections:   . Frequency of Communication with Friends and Family: Not on file  . Frequency of Social Gatherings with Friends and Family: Not on file  . Attends Religious Services: Not on file  . Active Member of Clubs or Organizations: Not on file  . Attends Archivist Meetings: Not on file  . Marital Status: Not on file     Family History: The patient's family history is negative for Bladder Cancer, Kidney cancer, and Prostate cancer. ROS:   Please see the history of present illness.     All other systems reviewed and are negative.   EKG:  EKG is ordered today.  The ekg ordered today demonstrates normal sinus rhythm, 83  bpm, QTC 413.  Mild nonspecific T wave change in aVF.  Recent Labs: 12/16/2018: Hemoglobin 17.2; Platelets 249 04/21/2019: Creatinine, Ser 1.00   Recent Lipid Panel No results found for: CHOL, TRIG, HDL, CHOLHDL, VLDL, LDLCALC, LDLDIRECT  Physical Exam:    VS:  BP (!) 142/90   Pulse 83   Ht 5\' 6"  (1.676 m)   Wt 166 lb 12.8 oz (75.7 kg)   SpO2 100%   BMI 26.92 kg/m     Wt Readings from Last 6 Encounters:  11/05/19 166 lb 12.8 oz (75.7 kg)  10/09/19 155 lb (70.3 kg)  06/02/19 160 lb (72.6 kg)  05/14/19 160 lb (72.6 kg)  05/05/19 160 lb (72.6 kg)  03/03/19 160 lb (72.6 kg)     Physical Exam  Constitutional: He is oriented to person, place, and time. He appears well-developed and well-nourished. No distress.  HENT:  Head: Normocephalic and atraumatic.  Neck: No JVD present.  Cardiovascular: Normal rate, regular rhythm, normal heart sounds and intact distal pulses. Exam reveals no gallop and no friction rub.  No murmur heard. Pulmonary/Chest: Effort normal and breath sounds normal. No respiratory distress. He has no wheezes. He has no rales.  Abdominal: Soft. Bowel sounds are normal.  Musculoskeletal:        General: No edema. Normal range of motion.     Cervical back: Normal range of motion and neck supple.  Neurological: He is alert and oriented to person, place, and time.  Skin: Skin is warm and dry.  Psychiatric: He has a normal mood and affect. His behavior is normal. Judgment and thought content normal.  Vitals reviewed.   ASSESSMENT:    1. Essential (primary) hypertension   2. Coronary artery disease involving native coronary artery of native heart with angina pectoris ([place])   3. Mixed hyperlipidemia    PLAN:    In order of problems listed above:  Hypertension -Patient is being seen today for uncontrolled blood pressure. -He notes that he was previously treated with lisinopril with well-controlled blood pressure.  This was stopped due to chronic cough and he  has been trialed on losartan and metoprolol with uncontrolled blood pressure.  Currently he is only taking metoprolol. -He notes that his cough did not get any better with stopping lisinopril and he feels like lisinopril was doing a good job on his blood pressure.  We will resume lisinopril 10 mg daily (also for renal protection with  diabetes).  Continue the metoprolol as his heart rate has been elevated. -I will see him back in 1 month for follow-up and if no improvement in HR, consider home cardiac monitor to evaluate for tachyarrhythmias.   CAD -Patient was referred to cardiology due to history of finding coronary calcifications incidentally on CT scan, strong family history and hyperlipidemia.  Patient had normal stress test and echo in 2019. -Coronary CTA done on 04/21/2019 found calcium score of 257 which is in the 97th percentile for age and sex.  He was noted to have age advanced but nonobstructive CAD involving proximal LAD, circumflex and OM1. -Statin therapy is indicated for prevention.  Hyperlipidemia -LDL 129 on recent lipid panel from [facility].  His lipids have been better controlled previously on statin however Crestor was discontinued due to possible relation to some complaints he had about memory issues.  -The patient is now not convinced that the statin had anything to do with his complaints.  He would like to try statin therapy again. -Will try atorvastatin 40 mg daily.  -Follow-up lipid panel/LFTs in 3 months. -If he does not tolerate this will refer to the lipid clinic.    Medication Adjustments/Labs and Tests Ordered: Current medicines are reviewed at length with the patient today.  Concerns regarding medicines are outlined above. Labs and tests ordered and medication changes are outlined in the patient instructions below:  Patient Instructions  Medication Instructions:  1) Start Atorvastatin (Lipitor) 40 mg once a day  2)Start Lisinopril (Zestril) 10 mg once a day   *If you  need a refill on your cardiac medications before your next appointment, please call your pharmacy*  Lab Work: Your physician recommends that you return for a FASTING lipid profile and hepatic function test on 02/01/2020 (Lab is open form 7:30 AM to 4:30 PM)  If you have labs (blood work) drawn today and your tests are completely normal, you will receive your results only by: Marland Kitchen MyChart Message (if you have MyChart) OR . A paper copy in the mail If you have any lab test that is abnormal or we need to change your treatment, we will call you to review the results.  Testing/Procedures: None ordered   Follow-Up: You are scheduled to see Pecolia Ades, NP on 12/08/2019 @ 8:15 AM  Other Instructions Lifestyle Modifications to Prevent and Treat Heart Disease -Recommend heart healthy/Mediterranean diet, with whole grains, fruits, vegetables, fish, lean meats, nuts, olive oil and avocado oil.  -Limit salt intake to less than 2000 mg per day.  -Recommend moderate walking, starting slowly with a few minutes and working up to 3-5 times/week for 30-50 minutes each session. Aim for at least 150 minutes.week. Goal should be pace of 3 miles/hours, or walking 1.5 miles in 30 minutes -Recommend avoidance of tobacco products. Avoid excess alcohol. -Keep blood pressure well controlled, ideally less than 130/80.        Signed, Daune Perch, NP  11/05/2019 9:07 AM    [place]

## 2019-11-05 ENCOUNTER — Ambulatory Visit: Payer: Medicare Other | Admitting: Physician Assistant

## 2019-11-05 ENCOUNTER — Telehealth: Payer: Self-pay | Admitting: Physician Assistant

## 2019-11-05 ENCOUNTER — Encounter: Payer: Self-pay | Admitting: Cardiology

## 2019-11-05 ENCOUNTER — Ambulatory Visit (INDEPENDENT_AMBULATORY_CARE_PROVIDER_SITE_OTHER): Payer: Medicare Other | Admitting: Cardiology

## 2019-11-05 ENCOUNTER — Other Ambulatory Visit: Payer: Self-pay

## 2019-11-05 VITALS — BP 142/90 | HR 83 | Ht 66.0 in | Wt 166.8 lb

## 2019-11-05 DIAGNOSIS — I10 Essential (primary) hypertension: Secondary | ICD-10-CM | POA: Diagnosis not present

## 2019-11-05 DIAGNOSIS — E782 Mixed hyperlipidemia: Secondary | ICD-10-CM | POA: Diagnosis not present

## 2019-11-05 DIAGNOSIS — I25119 Atherosclerotic heart disease of native coronary artery with unspecified angina pectoris: Secondary | ICD-10-CM

## 2019-11-05 MED ORDER — LISINOPRIL 10 MG PO TABS: 10.0000 mg | ORAL_TABLET | Freq: Every day | ORAL | 3 refills | Status: DC

## 2019-11-05 MED ORDER — ATORVASTATIN CALCIUM 40 MG PO TABS: 40.0000 mg | ORAL_TABLET | Freq: Every day | ORAL | 3 refills | Status: DC

## 2019-11-05 NOTE — Patient Instructions (Addendum)
Medication Instructions:  1) Start Atorvastatin (Lipitor) 40 mg once a day  2)Start Lisinopril (Zestril) 10 mg once a day   *If you need a refill on your cardiac medications before your next appointment, please call your pharmacy*  Lab Work: Your physician recommends that you return for a FASTING lipid profile and hepatic function test on 02/01/2020 (Lab is open form 7:30 AM to 4:30 PM)  If you have labs (blood work) drawn today and your tests are completely normal, you will receive your results only by: Marland Kitchen MyChart Message (if you have MyChart) OR . A paper copy in the mail If you have any lab test that is abnormal or we need to change your treatment, we will call you to review the results.  Testing/Procedures: None ordered   Follow-Up: You are scheduled to see Pecolia Ades, NP on 12/08/2019 @ 8:15 AM  Other Instructions Lifestyle Modifications to Prevent and Treat Heart Disease -Recommend heart healthy/Mediterranean diet, with whole grains, fruits, vegetables, fish, lean meats, nuts, olive oil and avocado oil.  -Limit salt intake to less than 2000 mg per day.  -Recommend moderate walking, starting slowly with a few minutes and working up to 3-5 times/week for 30-50 minutes each session. Aim for at least 150 minutes.week. Goal should be pace of 3 miles/hours, or walking 1.5 miles in 30 minutes -Recommend avoidance of tobacco products. Avoid excess alcohol. -Keep blood pressure well controlled, ideally less than 130/80.

## 2019-11-05 NOTE — Telephone Encounter (Signed)
Pt canceled today's appt, states he passed a 5.5 mm stone and doesn't feel he needs to be seen.  Will call office if/when he thinks he may need to be seen again.   FYI

## 2019-12-01 ENCOUNTER — Other Ambulatory Visit: Payer: Self-pay | Admitting: Orthopedic Surgery

## 2019-12-01 DIAGNOSIS — M25312 Other instability, left shoulder: Secondary | ICD-10-CM

## 2019-12-03 DIAGNOSIS — J301 Allergic rhinitis due to pollen: Secondary | ICD-10-CM | POA: Diagnosis not present

## 2019-12-08 ENCOUNTER — Ambulatory Visit: Payer: Medicare Other | Admitting: Cardiology

## 2019-12-09 ENCOUNTER — Ambulatory Visit
Admission: RE | Admit: 2019-12-09 | Discharge: 2019-12-09 | Disposition: A | Discharge status: Home / Self Care | Payer: No Typology Code available for payment source | Source: Ambulatory Visit | Attending: Orthopedic Surgery | Admitting: Orthopedic Surgery

## 2019-12-09 ENCOUNTER — Other Ambulatory Visit: Payer: Self-pay

## 2019-12-09 DIAGNOSIS — J301 Allergic rhinitis due to pollen: Secondary | ICD-10-CM | POA: Diagnosis not present

## 2019-12-09 DIAGNOSIS — M25312 Other instability, left shoulder: Secondary | ICD-10-CM

## 2019-12-09 DIAGNOSIS — M25512 Pain in left shoulder: Secondary | ICD-10-CM | POA: Diagnosis not present

## 2019-12-09 DIAGNOSIS — M19012 Primary osteoarthritis, left shoulder: Secondary | ICD-10-CM | POA: Diagnosis not present

## 2019-12-09 MED ORDER — IOHEXOL 180 MG/ML  SOLN
10.0000 mL | Freq: Once | INTRAMUSCULAR | Status: AC | PRN
  Administered 2019-12-09: 7 mL

## 2019-12-09 MED ORDER — SODIUM CHLORIDE (PF) 0.9 % IJ SOLN
10.0000 mL | Freq: Once | INTRAMUSCULAR | Status: AC
  Administered 2019-12-09: 10 mL

## 2019-12-09 MED ORDER — LIDOCAINE 1% INJECTION FOR CIRCUMCISION
5.0000 mL | INJECTION | Freq: Once | INTRAVENOUS | Status: AC
  Administered 2019-12-09: 5 mL via SUBCUTANEOUS
  Filled 2019-12-09: qty 5

## 2019-12-09 MED ORDER — SODIUM CHLORIDE 0.9 % IV SOLN: INTRAVENOUS | Status: DC

## 2019-12-10 DIAGNOSIS — G894 Chronic pain syndrome: Secondary | ICD-10-CM | POA: Diagnosis not present

## 2019-12-10 DIAGNOSIS — Z79899 Other long term (current) drug therapy: Secondary | ICD-10-CM | POA: Diagnosis not present

## 2019-12-10 DIAGNOSIS — M5412 Radiculopathy, cervical region: Secondary | ICD-10-CM | POA: Diagnosis not present

## 2019-12-10 DIAGNOSIS — M791 Myalgia, unspecified site: Secondary | ICD-10-CM | POA: Diagnosis not present

## 2019-12-10 DIAGNOSIS — M47814 Spondylosis without myelopathy or radiculopathy, thoracic region: Secondary | ICD-10-CM | POA: Diagnosis not present

## 2019-12-11 DIAGNOSIS — J301 Allergic rhinitis due to pollen: Secondary | ICD-10-CM | POA: Diagnosis not present

## 2019-12-14 DIAGNOSIS — G43719 Chronic migraine without aura, intractable, without status migrainosus: Secondary | ICD-10-CM | POA: Diagnosis not present

## 2019-12-15 DIAGNOSIS — J301 Allergic rhinitis due to pollen: Secondary | ICD-10-CM | POA: Diagnosis not present

## 2019-12-18 DIAGNOSIS — J301 Allergic rhinitis due to pollen: Secondary | ICD-10-CM | POA: Diagnosis not present

## 2019-12-21 DIAGNOSIS — M2550 Pain in unspecified joint: Secondary | ICD-10-CM | POA: Diagnosis not present

## 2019-12-21 DIAGNOSIS — K2100 Gastro-esophageal reflux disease with esophagitis, without bleeding: Secondary | ICD-10-CM | POA: Diagnosis not present

## 2019-12-21 DIAGNOSIS — E7800 Pure hypercholesterolemia, unspecified: Secondary | ICD-10-CM | POA: Diagnosis not present

## 2019-12-21 DIAGNOSIS — K219 Gastro-esophageal reflux disease without esophagitis: Secondary | ICD-10-CM | POA: Diagnosis not present

## 2019-12-21 DIAGNOSIS — K580 Irritable bowel syndrome with diarrhea: Secondary | ICD-10-CM | POA: Diagnosis not present

## 2019-12-21 DIAGNOSIS — E119 Type 2 diabetes mellitus without complications: Secondary | ICD-10-CM | POA: Diagnosis not present

## 2019-12-22 DIAGNOSIS — J301 Allergic rhinitis due to pollen: Secondary | ICD-10-CM | POA: Diagnosis not present

## 2019-12-23 DIAGNOSIS — M7582 Other shoulder lesions, left shoulder: Secondary | ICD-10-CM | POA: Diagnosis not present

## 2019-12-23 DIAGNOSIS — J301 Allergic rhinitis due to pollen: Secondary | ICD-10-CM | POA: Diagnosis not present

## 2019-12-23 DIAGNOSIS — M19012 Primary osteoarthritis, left shoulder: Secondary | ICD-10-CM | POA: Diagnosis not present

## 2019-12-24 ENCOUNTER — Other Ambulatory Visit: Payer: Self-pay | Admitting: Surgery

## 2019-12-25 DIAGNOSIS — J301 Allergic rhinitis due to pollen: Secondary | ICD-10-CM | POA: Diagnosis not present

## 2019-12-28 ENCOUNTER — Other Ambulatory Visit: Payer: Self-pay

## 2019-12-28 ENCOUNTER — Other Ambulatory Visit
Admission: RE | Admit: 2019-12-28 | Discharge: 2019-12-28 | Disposition: A | Discharge status: Home / Self Care | Payer: Medicare Other | Source: Ambulatory Visit | Attending: Surgery | Admitting: Surgery

## 2019-12-28 DIAGNOSIS — Z01818 Encounter for other preprocedural examination: Secondary | ICD-10-CM | POA: Insufficient documentation

## 2019-12-28 HISTORY — DX: Headache, unspecified: R51.9

## 2019-12-28 NOTE — Patient Instructions (Signed)
Your procedure is scheduled on: Wednesday December 30, 2019 Report to Day Surgery. To find out your arrival time please call [phone] between 1PM - 3PM on Tuesday December 29, 2019.  Remember: Instructions that are not followed completely may result in serious medical risk,  up to and including death, or upon the discretion of your surgeon and anesthesiologist your  surgery may need to be rescheduled.     _X__ 1. Do not eat food after midnight the night before your procedure.                 No gum chewing or hard candies. You may drink clear liquids up to 2 hours                 before you are scheduled to arrive for your surgery- DO not drink clear                 liquids within 2 hours of the start of your surgery.                 Clear Liquids include:  water, apple juice without pulp, clear Gatorade, G2 or                  Gatorade Zero (avoid Red/Purple/Blue), Black Coffee or Tea (Do not add                 anything to coffee or tea).  __x__2.   Complete the carbohydrate drink provided to you, 2 hours before arrival.  __X__3.  On the morning of surgery brush your teeth with toothpaste and water, you                may rinse your mouth with mouthwash if you wish.  Do not swallow any toothpaste of mouthwash.     _X__ 4.  No Alcohol for 24 hours before or after surgery.   _X__ 5.  Do Not Smoke or use e-cigarettes For 24 Hours Prior to Your Surgery.                 Do not use any chewable tobacco products for at least 6 hours prior to                 surgery.  __x__  6.  Notify your doctor if there is any change in your medical condition      (cold, fever, infections).     Do not wear jewelry, make-up, hairpins, clips or nail polish. Do not wear lotions, powders, or perfumes. You may wear deodorant. Do not shave 48 hours prior to surgery. Men may shave face and neck. Do not bring valuables to the hospital.    [facility] is not responsible for any belongings  or valuables.  Contacts, dentures or bridgework may not be worn into surgery. Leave your suitcase in the car. After surgery it may be brought to your room. For patients admitted to the hospital, discharge time is determined by your treatment team.   Patients discharged the day of surgery will not be allowed to drive home.   Make arrangements for someone to be with you for the first 24 hours of your Same Day Discharge.   __x__ Take these medicines the morning of surgery with A SIP OF WATER:    1. metoprolol succinate (TOPROL-XL)  2. pantoprazole (PROTONIX) 40 MG   __x__ Use CHG Soap as directed  __x__ Stop metformin 2 days prior to surgery (December 28, 2019)   __x__ Stop Anti-inflammatories such as Ibuprofen, aspirin, Aleve, naproxen, and or BC powders.   __x__ Stop supplements until after surgery.    __x__ Do not start any herbal supplements before your surgery.

## 2019-12-29 ENCOUNTER — Other Ambulatory Visit
Admission: RE | Admit: 2019-12-29 | Discharge: 2019-12-29 | Disposition: A | Discharge status: Home / Self Care | Payer: Medicare Other | Source: Ambulatory Visit | Attending: Surgery | Admitting: Surgery

## 2019-12-29 DIAGNOSIS — Z20822 Contact with and (suspected) exposure to covid-19: Secondary | ICD-10-CM | POA: Diagnosis not present

## 2019-12-29 DIAGNOSIS — J301 Allergic rhinitis due to pollen: Secondary | ICD-10-CM | POA: Diagnosis not present

## 2019-12-29 DIAGNOSIS — Z01812 Encounter for preprocedural laboratory examination: Secondary | ICD-10-CM | POA: Insufficient documentation

## 2019-12-29 LAB — SARS CORONAVIRUS 2 (TAT 6-24 HRS): SARS Coronavirus 2: NEGATIVE

## 2019-12-30 ENCOUNTER — Other Ambulatory Visit: Payer: Self-pay

## 2019-12-30 ENCOUNTER — Ambulatory Visit: Payer: Medicare Other

## 2019-12-30 ENCOUNTER — Ambulatory Visit: Payer: Medicare Other | Admitting: Anesthesiology

## 2019-12-30 ENCOUNTER — Ambulatory Visit
Admission: RE | Admit: 2019-12-30 | Discharge: 2019-12-30 | Disposition: A | Discharge status: Home / Self Care | Payer: Medicare Other | Attending: Surgery | Admitting: Surgery

## 2019-12-30 ENCOUNTER — Encounter
Admission: RE | Disposition: A | Discharge status: Home / Self Care | Payer: Self-pay | Source: Home / Self Care | Attending: Surgery

## 2019-12-30 ENCOUNTER — Encounter: Payer: Self-pay | Admitting: Surgery

## 2019-12-30 DIAGNOSIS — M7522 Bicipital tendinitis, left shoulder: Secondary | ICD-10-CM | POA: Insufficient documentation

## 2019-12-30 DIAGNOSIS — M25812 Other specified joint disorders, left shoulder: Secondary | ICD-10-CM | POA: Diagnosis not present

## 2019-12-30 DIAGNOSIS — M19012 Primary osteoarthritis, left shoulder: Secondary | ICD-10-CM | POA: Insufficient documentation

## 2019-12-30 DIAGNOSIS — I129 Hypertensive chronic kidney disease with stage 1 through stage 4 chronic kidney disease, or unspecified chronic kidney disease: Secondary | ICD-10-CM | POA: Insufficient documentation

## 2019-12-30 DIAGNOSIS — G8918 Other acute postprocedural pain: Secondary | ICD-10-CM | POA: Diagnosis not present

## 2019-12-30 DIAGNOSIS — M7542 Impingement syndrome of left shoulder: Secondary | ICD-10-CM | POA: Diagnosis not present

## 2019-12-30 DIAGNOSIS — M25512 Pain in left shoulder: Secondary | ICD-10-CM | POA: Diagnosis not present

## 2019-12-30 DIAGNOSIS — Z79899 Other long term (current) drug therapy: Secondary | ICD-10-CM | POA: Diagnosis not present

## 2019-12-30 DIAGNOSIS — M75102 Unspecified rotator cuff tear or rupture of left shoulder, not specified as traumatic: Secondary | ICD-10-CM | POA: Diagnosis not present

## 2019-12-30 DIAGNOSIS — N189 Chronic kidney disease, unspecified: Secondary | ICD-10-CM | POA: Insufficient documentation

## 2019-12-30 DIAGNOSIS — Z79891 Long term (current) use of opiate analgesic: Secondary | ICD-10-CM | POA: Diagnosis not present

## 2019-12-30 DIAGNOSIS — F329 Major depressive disorder, single episode, unspecified: Secondary | ICD-10-CM | POA: Diagnosis not present

## 2019-12-30 DIAGNOSIS — M7582 Other shoulder lesions, left shoulder: Secondary | ICD-10-CM | POA: Diagnosis not present

## 2019-12-30 DIAGNOSIS — Z7984 Long term (current) use of oral hypoglycemic drugs: Secondary | ICD-10-CM | POA: Diagnosis not present

## 2019-12-30 DIAGNOSIS — S43432A Superior glenoid labrum lesion of left shoulder, initial encounter: Secondary | ICD-10-CM | POA: Insufficient documentation

## 2019-12-30 DIAGNOSIS — M75112 Incomplete rotator cuff tear or rupture of left shoulder, not specified as traumatic: Secondary | ICD-10-CM | POA: Diagnosis not present

## 2019-12-30 DIAGNOSIS — K219 Gastro-esophageal reflux disease without esophagitis: Secondary | ICD-10-CM | POA: Insufficient documentation

## 2019-12-30 DIAGNOSIS — X58XXXA Exposure to other specified factors, initial encounter: Secondary | ICD-10-CM | POA: Diagnosis not present

## 2019-12-30 DIAGNOSIS — G8929 Other chronic pain: Secondary | ICD-10-CM | POA: Insufficient documentation

## 2019-12-30 DIAGNOSIS — E785 Hyperlipidemia, unspecified: Secondary | ICD-10-CM | POA: Diagnosis not present

## 2019-12-30 DIAGNOSIS — E1122 Type 2 diabetes mellitus with diabetic chronic kidney disease: Secondary | ICD-10-CM | POA: Diagnosis not present

## 2019-12-30 DIAGNOSIS — Z419 Encounter for procedure for purposes other than remedying health state, unspecified: Secondary | ICD-10-CM

## 2019-12-30 HISTORY — PX: BICEPT TENODESIS: SHX5116

## 2019-12-30 HISTORY — PX: SHOULDER ARTHROSCOPY WITH ROTATOR CUFF REPAIR: SHX5685

## 2019-12-30 LAB — GLUCOSE, CAPILLARY
Glucose-Capillary: 144 mg/dL — ABNORMAL HIGH (ref 70–99)
Glucose-Capillary: 169 mg/dL — ABNORMAL HIGH (ref 70–99)

## 2019-12-30 SURGERY — SHOULDER ARTHROSCOPY WITH SUBACROMIAL DECOMPRESSION AND DISTAL CLAVICLE EXCISION
Anesthesia: General | Site: Shoulder | Laterality: Left

## 2019-12-30 MED ORDER — LIDOCAINE HCL (PF) 1 % IJ SOLN
INTRAMUSCULAR | Status: AC
  Filled 2019-12-30: qty 5

## 2019-12-30 MED ORDER — FENTANYL CITRATE (PF) 100 MCG/2ML IJ SOLN
INTRAMUSCULAR | Status: AC
  Filled 2019-12-30: qty 2

## 2019-12-30 MED ORDER — ONDANSETRON HCL 4 MG/2ML IJ SOLN
INTRAMUSCULAR | Status: DC | PRN
  Administered 2019-12-30: 4 mg via INTRAVENOUS

## 2019-12-30 MED ORDER — CHLORHEXIDINE GLUCONATE 4 % EX LIQD
60.0000 mL | Freq: Once | CUTANEOUS | Status: AC
  Administered 2019-12-30: 4 via TOPICAL

## 2019-12-30 MED ORDER — CEFAZOLIN SODIUM-DEXTROSE 2-4 GM/100ML-% IV SOLN
INTRAVENOUS | Status: AC
  Filled 2019-12-30: qty 100

## 2019-12-30 MED ORDER — FENTANYL CITRATE (PF) 100 MCG/2ML IJ SOLN
25.0000 ug | INTRAMUSCULAR | Status: DC | PRN
  Administered 2019-12-30: 25 ug via INTRAVENOUS
  Administered 2019-12-30: 100 ug via INTRAVENOUS

## 2019-12-30 MED ORDER — PROPOFOL 500 MG/50ML IV EMUL
INTRAVENOUS | Status: AC
  Filled 2019-12-30: qty 50

## 2019-12-30 MED ORDER — OXYCODONE HCL 5 MG PO TABS
5.0000 mg | ORAL_TABLET | Freq: Once | ORAL | Status: AC | PRN
  Administered 2019-12-30: 5 mg via ORAL

## 2019-12-30 MED ORDER — PHENYLEPHRINE HCL (PRESSORS) 10 MG/ML IV SOLN
INTRAVENOUS | Status: AC
  Filled 2019-12-30: qty 1

## 2019-12-30 MED ORDER — EPINEPHRINE PF 1 MG/ML IJ SOLN
INTRAMUSCULAR | Status: AC
  Filled 2019-12-30: qty 1

## 2019-12-30 MED ORDER — BUPIVACAINE-EPINEPHRINE (PF) 0.5% -1:200000 IJ SOLN
INTRAMUSCULAR | Status: DC | PRN
  Administered 2019-12-30: 30 mL via PERINEURAL

## 2019-12-30 MED ORDER — DEXAMETHASONE SODIUM PHOSPHATE 10 MG/ML IJ SOLN
INTRAMUSCULAR | Status: DC | PRN
  Administered 2019-12-30: 10 mg via INTRAVENOUS

## 2019-12-30 MED ORDER — BUPIVACAINE-EPINEPHRINE (PF) 0.5% -1:200000 IJ SOLN
INTRAMUSCULAR | Status: AC
  Filled 2019-12-30: qty 30

## 2019-12-30 MED ORDER — SODIUM CHLORIDE 0.9 % IV SOLN: INTRAVENOUS | Status: DC

## 2019-12-30 MED ORDER — LIDOCAINE HCL (PF) 1 % IJ SOLN
INTRAMUSCULAR | Status: DC | PRN
  Administered 2019-12-30: 2 mL

## 2019-12-30 MED ORDER — PHENYLEPHRINE HCL (PRESSORS) 10 MG/ML IV SOLN
INTRAVENOUS | Status: DC | PRN
  Administered 2019-12-30 (×2): 100 ug via INTRAVENOUS
  Administered 2019-12-30: 200 ug via INTRAVENOUS
  Administered 2019-12-30 (×3): 100 ug via INTRAVENOUS

## 2019-12-30 MED ORDER — OXYCODONE HCL 5 MG PO TABS
ORAL_TABLET | ORAL | Status: AC
  Filled 2019-12-30: qty 1

## 2019-12-30 MED ORDER — LIDOCAINE HCL (CARDIAC) PF 100 MG/5ML IV SOSY
PREFILLED_SYRINGE | INTRAVENOUS | Status: DC | PRN
  Administered 2019-12-30: 100 mg via INTRAVENOUS

## 2019-12-30 MED ORDER — BUPIVACAINE HCL (PF) 0.5 % IJ SOLN
INTRAMUSCULAR | Status: DC | PRN
  Administered 2019-12-30 (×2): 5 mL

## 2019-12-30 MED ORDER — PHENYLEPHRINE HCL-NACL 20-0.9 MG/250ML-% IV SOLN
INTRAVENOUS | Status: DC | PRN
  Administered 2019-12-30: 100 ug/min via INTRAVENOUS

## 2019-12-30 MED ORDER — HYDROMORPHONE HCL 1 MG/ML IJ SOLN
0.2500 mg | INTRAMUSCULAR | Status: DC | PRN
  Administered 2019-12-30 (×4): 0.25 mg via INTRAVENOUS

## 2019-12-30 MED ORDER — OXYCODONE HCL 5 MG/5ML PO SOLN: 5.0000 mg | Freq: Once | ORAL | Status: AC | PRN

## 2019-12-30 MED ORDER — FENTANYL CITRATE (PF) 100 MCG/2ML IJ SOLN
INTRAMUSCULAR | Status: AC
  Administered 2019-12-30: 50 ug via INTRAVENOUS
  Filled 2019-12-30: qty 2

## 2019-12-30 MED ORDER — MIDAZOLAM HCL 2 MG/2ML IJ SOLN
INTRAMUSCULAR | Status: AC
  Filled 2019-12-30: qty 2

## 2019-12-30 MED ORDER — BUPIVACAINE HCL (PF) 0.5 % IJ SOLN
INTRAMUSCULAR | Status: AC
  Filled 2019-12-30: qty 10

## 2019-12-30 MED ORDER — HYDROMORPHONE HCL 1 MG/ML IJ SOLN
INTRAMUSCULAR | Status: AC
  Filled 2019-12-30: qty 1

## 2019-12-30 MED ORDER — EPINEPHRINE PF 1 MG/ML IJ SOLN
INTRAMUSCULAR | Status: DC | PRN
  Administered 2019-12-30: 1 mg

## 2019-12-30 MED ORDER — MIDAZOLAM HCL 2 MG/2ML IJ SOLN
INTRAMUSCULAR | Status: AC
  Administered 2019-12-30: 1 mg via INTRAVENOUS
  Filled 2019-12-30: qty 2

## 2019-12-30 MED ORDER — BUPIVACAINE LIPOSOME 1.3 % IJ SUSP
INTRAMUSCULAR | Status: AC
  Filled 2019-12-30: qty 20

## 2019-12-30 MED ORDER — MIDAZOLAM HCL 2 MG/2ML IJ SOLN
1.0000 mg | INTRAMUSCULAR | Status: AC | PRN
  Administered 2019-12-30: 2 mg via INTRAVENOUS

## 2019-12-30 MED ORDER — PROPOFOL 10 MG/ML IV BOLUS
INTRAVENOUS | Status: DC | PRN
  Administered 2019-12-30: 200 mg via INTRAVENOUS

## 2019-12-30 MED ORDER — SUGAMMADEX SODIUM 200 MG/2ML IV SOLN
INTRAVENOUS | Status: DC | PRN
  Administered 2019-12-30: 200 mg via INTRAVENOUS

## 2019-12-30 MED ORDER — ROCURONIUM BROMIDE 100 MG/10ML IV SOLN
INTRAVENOUS | Status: DC | PRN
  Administered 2019-12-30: 50 mg via INTRAVENOUS

## 2019-12-30 MED ORDER — CEFAZOLIN SODIUM-DEXTROSE 2-4 GM/100ML-% IV SOLN
2.0000 g | INTRAVENOUS | Status: AC
  Administered 2019-12-30: 2 g via INTRAVENOUS

## 2019-12-30 MED ORDER — BUPIVACAINE LIPOSOME 1.3 % IJ SUSP
INTRAMUSCULAR | Status: DC | PRN
  Administered 2019-12-30 (×2): 10 mL

## 2019-12-30 MED ORDER — PROMETHAZINE HCL 25 MG/ML IJ SOLN: 6.2500 mg | INTRAMUSCULAR | Status: DC | PRN

## 2019-12-30 MED ORDER — LACTATED RINGERS IV SOLN: INTRAVENOUS | Status: DC | PRN

## 2019-12-30 MED ORDER — FENTANYL CITRATE (PF) 100 MCG/2ML IJ SOLN
50.0000 ug | INTRAMUSCULAR | Status: AC | PRN
  Administered 2019-12-30: 50 ug via INTRAVENOUS

## 2019-12-30 SURGICAL SUPPLY — 50 items
ANCH SUT 2 2.9 2 LD TPR NDL (Anchor) ×1 IMPLANT
ANCH SUT 2 2/0 ABS BRD STRL (Anchor) ×1 IMPLANT
ANCHOR JUGGERKNOT WTAP NDL 2.9 (Anchor) ×1 IMPLANT
ANCHOR SUT W/ ORTHOCORD (Anchor) ×1 IMPLANT
APL PRP STRL LF DISP 70% ISPRP (MISCELLANEOUS) ×1
BIT DRILL JUGRKNT W/NDL BIT2.9 (DRILL) IMPLANT
BLADE FULL RADIUS 3.5 (BLADE) ×2 IMPLANT
BUR ACROMIONIZER 4.0 (BURR) ×2 IMPLANT
BUR BR 5.5 12 FLUTE (BURR) IMPLANT
BUR BR 5.5 WIDE MOUTH (BURR) ×1 IMPLANT
CANNULA SHAVER 8MMX76MM (CANNULA) ×2 IMPLANT
CHLORAPREP W/TINT 26 (MISCELLANEOUS) ×2 IMPLANT
COVER MAYO STAND REUSABLE (DRAPES) ×2 IMPLANT
COVER WAND RF STERILE (DRAPES) ×2 IMPLANT
DRAPE IMP U-DRAPE 54X76 (DRAPES) ×4 IMPLANT
DRILL JUGGERKNOT W/NDL BIT 2.9 (DRILL) ×2
ELECT REM PT RETURN 9FT ADLT (ELECTROSURGICAL) ×2
ELECTRODE REM PT RTRN 9FT ADLT (ELECTROSURGICAL) ×1 IMPLANT
GAUZE SPONGE 4X4 12PLY STRL (GAUZE/BANDAGES/DRESSINGS) ×2 IMPLANT
GAUZE XEROFORM 1X8 LF (GAUZE/BANDAGES/DRESSINGS) ×2 IMPLANT
GLOVE BIO SURGEON STRL SZ7.5 (GLOVE) ×4 IMPLANT
GLOVE BIO SURGEON STRL SZ8 (GLOVE) ×4 IMPLANT
GLOVE BIOGEL PI IND STRL 8 (GLOVE) ×1 IMPLANT
GLOVE BIOGEL PI INDICATOR 8 (GLOVE) ×1
GLOVE INDICATOR 8.0 STRL GRN (GLOVE) ×2 IMPLANT
GOWN STRL REUS W/ TWL LRG LVL3 (GOWN DISPOSABLE) ×1 IMPLANT
GOWN STRL REUS W/ TWL XL LVL3 (GOWN DISPOSABLE) ×1 IMPLANT
GOWN STRL REUS W/TWL LRG LVL3 (GOWN DISPOSABLE) ×2
GOWN STRL REUS W/TWL XL LVL3 (GOWN DISPOSABLE) ×2
GRASPER SUT 15 45D LOW PRO (SUTURE) ×1 IMPLANT
IV LACTATED RINGER IRRG 3000ML (IV SOLUTION) ×6
IV LR IRRIG 3000ML ARTHROMATIC (IV SOLUTION) ×1 IMPLANT
KIT CANNULA 8X76-LX IN CANNULA (CANNULA) ×2 IMPLANT
MANIFOLD NEPTUNE II (INSTRUMENTS) ×2 IMPLANT
MASK FACE SPIDER DISP (MASK) ×2 IMPLANT
MAT ABSORB  FLUID 56X50 GRAY (MISCELLANEOUS) ×2
MAT ABSORB FLUID 56X50 GRAY (MISCELLANEOUS) ×1 IMPLANT
NEEDLE HYPO 22GX1.5 SAFETY (NEEDLE) ×2 IMPLANT
PACK ARTHROSCOPY SHOULDER (MISCELLANEOUS) ×2 IMPLANT
PENCIL SMOKE EVACUATOR (MISCELLANEOUS) ×1 IMPLANT
SLING ARM LRG DEEP (SOFTGOODS) ×1 IMPLANT
SLING ULTRA II LG (MISCELLANEOUS) ×1 IMPLANT
STAPLER SKIN PROX 35W (STAPLE) ×2 IMPLANT
STRAP SAFETY 5IN WIDE (MISCELLANEOUS) ×2 IMPLANT
SUT ETHIBOND 0 MO6 C/R (SUTURE) ×2 IMPLANT
SUT VIC AB 2-0 CT1 27 (SUTURE) ×2
SUT VIC AB 2-0 CT1 TAPERPNT 27 (SUTURE) ×2 IMPLANT
TAPE MICROFOAM 4IN (TAPE) ×2 IMPLANT
TUBING ARTHRO INFLOW-ONLY STRL (TUBING) ×2 IMPLANT
WAND WEREWOLF FLOW 90D (MISCELLANEOUS) ×2 IMPLANT

## 2019-12-30 NOTE — Transfer of Care (Addendum)
Immediate Anesthesia Transfer of Care Note  Patient: [name] Slipper Caserta Jr.  Procedure(s) Performed: LEFT SHOULDER ARTHROSCOPY WITH DEBRIDEMENT, DECOMPRESSION, AND EXCISION OF LEFT DISTAL CLAVICLE. (Left Shoulder) MINI BICEPS TENODESIS (Left Shoulder) SHOULDER ARTHROSCOPY WITH ROTATOR CUFF REPAIR (Left Shoulder)  Patient Location: PACU  Anesthesia Type:General  Level of Consciousness: sedated  Airway & Oxygen Therapy: Patient Spontanous Breathing and Patient connected to face mask oxygen  Post-op Assessment: Report given to RN and Post -op Vital signs reviewed and stable  Post vital signs: Reviewed and stable  Last Vitals:  Vitals Value Taken Time  BP 107/72 12/30/19 0958  Temp 36.2 C 12/30/19 0958  Pulse 90 12/30/19 1003  Resp 16 12/30/19 1003  SpO2 95 % 12/30/19 1003  Vitals shown include unvalidated device data.  Last Pain: There were no vitals filed for this visit.       Complications: No apparent anesthesia complications

## 2019-12-30 NOTE — Anesthesia Postprocedure Evaluation (Signed)
Anesthesia Post Note  Patient: Enis Slipper Emel Jr.  Procedure(s) Performed: LEFT SHOULDER ARTHROSCOPY WITH DEBRIDEMENT, DECOMPRESSION, AND EXCISION OF LEFT DISTAL CLAVICLE. (Left Shoulder) MINI BICEPS TENODESIS (Left Shoulder) SHOULDER ARTHROSCOPY WITH ROTATOR CUFF REPAIR (Left Shoulder)  Patient location during evaluation: PACU Anesthesia Type: General Level of consciousness: awake and alert Pain management: pain level controlled Vital Signs Assessment: post-procedure vital signs reviewed and stable Respiratory status: spontaneous breathing, nonlabored ventilation and respiratory function stable Cardiovascular status: blood pressure returned to baseline and stable Postop Assessment: no apparent nausea or vomiting Anesthetic complications: no     Last Vitals:  Vitals:   12/30/19 1112 12/30/19 1118  BP:  (!) 188/74  Pulse: 95 99  Resp: 18 16  Temp: (!) 36.2 C (!) 36.3 C  SpO2: 95% 97%    Last Pain:  Vitals:   12/30/19 1118  TempSrc: Temporal  PainSc: [place]

## 2019-12-30 NOTE — Anesthesia Preprocedure Evaluation (Addendum)
Anesthesia Evaluation  Patient identified by MRN, date of birth, ID band Patient awake    Reviewed: Allergy & Precautions, H&P , NPO status , Patient's Chart, lab work & pertinent test results  Airway Mallampati: II  TM Distance: >3 FB Neck ROM: full   Comment: beard Dental  (+) Teeth Intact   Pulmonary neg pulmonary ROS,           Cardiovascular hypertension, (-) angina(-) Past MI      Neuro/Psych  Headaches, PSYCHIATRIC DISORDERS Depression Chronic pain, takes Oxycontin    GI/Hepatic Neg liver ROS, GERD  ,  Endo/Other  diabetes, Type 2  Renal/GU Renal disease (CRI)     Musculoskeletal   Abdominal   Peds  Hematology negative hematology ROS (+)   Anesthesia Other Findings Past Medical History: No date: Chronic kidney disease     Comment:  H/O KIDNEY STONES No date: Chronic pain No date: Diabetes mellitus without complication (HCC) No date: DJD (degenerative joint disease) No date: GERD (gastroesophageal reflux disease) No date: Headache No date: Hyperlipidemia No date: Hypertension No date: Hypogonadism male  Past Surgical History: 01/05/2019: COLONOSCOPY WITH PROPOFOL; N/A     Comment:  Procedure: COLONOSCOPY WITH PROPOFOL;  Surgeon:               Lollie Sails, MD;  Location: [facility] ENDOSCOPY;                Service: Endoscopy;  Laterality: N/A; 01/05/2019: ESOPHAGOGASTRODUODENOSCOPY (EGD) WITH PROPOFOL; N/A     Comment:  Procedure: ESOPHAGOGASTRODUODENOSCOPY (EGD) WITH               PROPOFOL;  Surgeon: Lollie Sails, MD;  Location:               [facility] ENDOSCOPY;  Service: Endoscopy;  Laterality: N/A; 05/14/2019: EXTRACORPOREAL SHOCK WAVE LITHOTRIPSY; Right     Comment:  Procedure: EXTRACORPOREAL SHOCK WAVE LITHOTRIPSY (ESWL);              Surgeon: [name], MD;  Location: [facility] ORS;                Service: Urology;  Laterality: Right; No date: KNEE ARTHROSCOPY 01/19/2016: KNEE ARTHROSCOPY  WITH MENISCAL REPAIR; Right     Comment:  Procedure: KNEE ARTHROSCOPY WITH MENISCAL REPAIR;                Surgeon: Corky Mull, MD;  Location: [facility] ORS;  Service:              Orthopedics;  Laterality: Right; 06/12/2016: KNEE ARTHROSCOPY WITH MENISCAL REPAIR; Right     Comment:  Procedure: KNEE ARTHROSCOPY WITH PARTIAL MEDIAL MENISCAL              REPAIR;  Surgeon: Corky Mull, MD;  Location: [facility] ORS;               Service: Orthopedics;  Laterality: Right; No date: PARATHYROIDECTOMY No date: SHOULDER SURGERY; Bilateral No date: SPINAL CORD STIMULATOR BATTERY EXCHANGE 2008: SPINAL CORD STIMULATOR IMPLANT No date: SPINAL FUSION     Comment:  L4-L5 (2004), L5-S1 (2006) No date: THYROIDECTOMY, PARTIAL No date: TONSILLECTOMY     Reproductive/Obstetrics negative OB ROS                            Anesthesia Physical Anesthesia Plan  ASA: II  Anesthesia Plan: General ETT   Post-op Pain Management: GA combined w/ Regional for  post-op pain   Induction:   PONV Risk Score and Plan: Ondansetron, Dexamethasone, Midazolam and Treatment may vary due to age or medical condition  Airway Management Planned:   Additional Equipment:   Intra-op Plan:   Post-operative Plan:   Informed Consent: I have reviewed the patients History and Physical, chart, labs and discussed the procedure including the risks, benefits and alternatives for the proposed anesthesia with the patient or authorized representative who has indicated his/her understanding and acceptance.     Dental Advisory Given  Plan Discussed with: Anesthesiologist  Anesthesia Plan Comments:         Anesthesia Quick Evaluation

## 2019-12-30 NOTE — Anesthesia Procedure Notes (Signed)
Procedure Name: Intubation Date/Time: 12/30/2019 7:46 AM Performed by: Justus Memory, CRNA Pre-anesthesia Checklist: Patient identified, Patient being monitored, Timeout performed, Emergency Drugs available and Suction available Patient Re-evaluated:Patient Re-evaluated prior to induction Oxygen Delivery Method: Circle system utilized Preoxygenation: Pre-oxygenation with 100% oxygen Induction Type: IV induction Ventilation: Two handed mask ventilation required and Mask ventilation with difficulty Laryngoscope Size: 3 and McGraph Grade View: Grade I Tube type: Oral Tube size: 7.0 mm Number of attempts: 1 Airway Equipment and Method: Stylet and Video-laryngoscopy Placement Confirmation: ETT inserted through vocal cords under direct vision,  positive ETCO2 and breath sounds checked- equal and bilateral Secured at: 21 cm Tube secured with: Tape Dental Injury: Teeth and Oropharynx as per pre-operative assessment

## 2019-12-30 NOTE — Op Note (Signed)
12/30/2019  9:56 AM  Patient:   [name] Slipper Matera Jr.  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  Post-Op Diagnosis:   Impingement/tendinopathy with partial-thickness subscapularis tendon tear, Type I SLAP tear, degenerative joint disease of AC joint, and biceps tendinopathy, left shoulder.  Procedure:   Extensive arthroscopic debridement, arthroscopic repair of partial-thickness subscapularis tendon tear, arthroscopic excision of distal clavicle, arthroscopic subacromial decompression, and mini-open biceps tenodesis, left shoulder.  Anesthesia:   General endotracheal with interscalene block using Exparel placed preoperatively by the anesthesiologist.  Surgeon:   Pascal Lux, MD  Assistant:   None  Findings:   As above. There was a partial-thickness tear involving the superior insertional fibers of the subscapularis tendon. The remainder of the rotator cuff was in satisfactory condition. There was a Type I SLAP involving the labrum from the 11:00 to the 1:30 position without frank detachment of the labrum from the glenoid. The biceps tendon demonstrated areas of "lip sticking", but no partial or full-thickness tears were identified. The articular surfaces of the glenoid and humerus both were in excellent condition.  Complications:   None  Fluids:   800 cc  Estimated blood loss:   15 cc  Tourniquet time:   None  Drains:   None  Closure:   Staples      Brief clinical note:   The patient is a 49 year old male with a 1+ year history of left shoulder pain. The patient's symptoms have progressed despite medications, activity modification, etc. The patient's history and examination are consistent with impingement/tendinopathy with degenerative joint disease of the [facility] joint. These findings were confirmed by an arthro-CT scan. The patient presents at this time for definitive management of these shoulder symptoms.  Procedure:    The patient underwent placement of an interscalene block using Exparel by the anesthesiologist in the preoperative holding area before being brought into the operating room and lain in the supine position. The patient then underwent general endotracheal intubation and anesthesia before being repositioned in the beach chair position using the beach chair positioner. The left shoulder and upper extremity were prepped with ChloraPrep solution before being draped sterilely. Preoperative antibiotics were administered. A timeout was performed to confirm the proper surgical site before the expected portal sites and incision site were injected with 0.5% Sensorcaine with epinephrine. A posterior portal was created and the glenohumeral joint thoroughly inspected with the findings as described above. An anterior portal was created using an outside-in technique. The labrum and rotator cuff were further probed, again confirming the above-noted findings. Areas of labral fraying were debrided back to stable margins using the full-radius resector, as was the torn portion of the subscapularis tendon tear. Areas of synovitis also were debrided using the full-radius resector. The ArthroCare wand was inserted and used to release the biceps tendon. It also was used to obtain hemostasis as well as to "anneal" the labrum superiorly and anteriorly.   A separate superolateral portal was created using an outside-in technique to serve as a working portal. The subscapularis tendon tear was repaired using a single Mytec BioKnotless anchor placed through the anterior portal. The adequacy of repair was assessed both by probing and by active passive external rotation of the arm and found to be excellent. The instruments were removed from the joint after suctioning the excess fluid.  The camera was repositioned through the posterior portal into the subacromial space. A separate lateral portal was created using an outside-in technique. The  3.5  mm full-radius resector was introduced and used to perform a subtotal bursectomy. The ArthroCare wand was then inserted and used to remove the periosteal tissue off the undersurface of the anterior third of the acromion as well as to recess the coracoacromial ligament from its attachment along the anterior and lateral margins of the acromion. The 4.0 mm acromionizing bur was introduced and used to complete the decompression by removing the undersurface of the anterior third of the acromion.   This dissection was carried medially to expose the undersurface of the [facility] joint. The undersurface of the distal clavicle was debrided using the ArthroCare wand. Utilizing the 4 mm acromionizing bur, the under side of the distal clavicle was removed. The camera was repositioned in the lateral portal and the 4 mm acromionizing bur placed through the anterior portal to further remove more distal clavicle. The distal clavicle was quite large, so the 5.5 mm acromionizing bur was utilized to complete the distal clavicle resection. The camera was placed into the anterior portal to verify adequate removal of the distal clavicle. The full radius resector was reintroduced to remove any residual bony debris before the ArthroCare wand was reintroduced to obtain hemostasis. The instruments were then removed from the subacromial space after suctioning the excess fluid.  An approximately 4-5 cm incision was made over the anterolateral aspect of the shoulder beginning at the anterolateral corner of the acromion and extending distally in line with the bicipital groove. This incision was carried down through the subcutaneous tissues to expose the deltoid fascia. The raphae between the anterior and middle thirds was identified and this plane developed to provide access into the subacromial space. Additional bursal tissues were debrided sharply using Metzenbaum scissors. The rotator cuff was identified and carefully inspected. No bursal sided  tears were identified.  The bicipital groove was identified by palpation and opened for 1-1.5 cm. The biceps tendon stump was retrieved through this defect. The floor of the bicipital groove was roughened with a curet before a single Biomet 2.9 mm JuggerKnot anchor was inserted. Both sets of sutures were passed through the biceps tendon and tied securely to effect the tenodesis. The bicipital sheath was reapproximated using two #0 Ethibond interrupted sutures, incorporating the biceps tendon to further reinforce the tenodesis.  The wound was copiously irrigated with sterile saline solution before the deltoid raphae was reapproximated using 2-0 Vicryl interrupted sutures. The subcutaneous tissues were closed in two layers using 2-0 Vicryl interrupted sutures before the skin was closed using staples. The portal sites also were closed using staples. A sterile bulky dressing was applied to the shoulder before the arm was placed into a shoulder immobilizer. The patient was then awakened, extubated, and returned to the recovery room in satisfactory condition after tolerating the procedure well.

## 2019-12-30 NOTE — Anesthesia Procedure Notes (Signed)
Anesthesia Regional Block: Interscalene brachial plexus block   Pre-Anesthetic Checklist: ,, timeout performed, Correct Patient, Correct Site, Correct Laterality, Correct Procedure, Correct Position, site marked, Risks and benefits discussed,  Surgical consent,  Pre-op evaluation,  At surgeon's request and post-op pain management  Laterality: Left  Prep: chloraprep       Needles:  Injection technique: Single-shot  Needle Type: Stimiplex     Needle Length: 9cm  Needle Gauge: 21     Additional Needles:   Procedures:,,,, ultrasound used (permanent image in chart),,,,  Narrative:  Start time: 12/30/2019 7:25 AM End time: 12/30/2019 7:30 AM  Performed by: Personally  Anesthesiologist: Tera Mater, MD  Additional Notes: Risks and benefits of nerve block discussed with patient, including but not limited to risk of nerve injury, bleeding, infection, and failed block.  Patient expressed understanding and consented to block placement.   Functioning IV was confirmed and monitors were applied.  Sterile prep,hand hygiene and sterile gloves were used.  Minimal sedation used for procedure.  During the procedure, there was negative aspiration, negative paresthesia on injection, and dose was given in divided aliquots under ultrasound guidance.  Patient tolerated the procedure well with no immediate complications.

## 2019-12-30 NOTE — Discharge Instructions (Addendum)
Orthopedic discharge instructions: Keep dressing dry and intact.  May shower after dressing changed on post-op day #4 (Sunday).  Cover staples with Band-Aids after drying off. Apply ice frequently to shoulder. Take ibuprofen 600-800 mg TID with meals for 7-10 days, then as necessary. Take pain meds as prescribed when needed.  May supplement with ES Tylenol if necessary. Keep shoulder immobilizer on at all times except may remove for bathing purposes. Follow-up in 10-14 days or as scheduled.  AMBULATORY SURGERY  DISCHARGE INSTRUCTIONS   1) The drugs that you were given will stay in your system until tomorrow so for the next 24 hours you should not:  A) Drive an automobile B) Make any legal decisions C) Drink any alcoholic beverage   2) You may resume regular meals tomorrow.  Today it is better to start with liquids and gradually work up to solid foods.  You may eat anything you prefer, but it is better to start with liquids, then soup and crackers, and gradually work up to solid foods.   3) Please notify your doctor immediately if you have any unusual bleeding, trouble breathing, redness and pain at the surgery site, drainage, fever, or pain not relieved by medication.    4) Additional Instructions:        Please contact your physician with any problems or Same Day Surgery at [phone], Monday through Friday 6 am to 4 pm, or [place] [facility] number at [phone].

## 2019-12-30 NOTE — H&P (Signed)
Paper H&P to be scanned into permanent record. H&P reviewed and patient re-examined. No changes. 

## 2020-01-01 DIAGNOSIS — J301 Allergic rhinitis due to pollen: Secondary | ICD-10-CM | POA: Diagnosis not present

## 2020-01-04 DIAGNOSIS — Z9889 Other specified postprocedural states: Secondary | ICD-10-CM | POA: Diagnosis not present

## 2020-01-04 DIAGNOSIS — M25612 Stiffness of left shoulder, not elsewhere classified: Secondary | ICD-10-CM | POA: Diagnosis not present

## 2020-01-04 DIAGNOSIS — M25512 Pain in left shoulder: Secondary | ICD-10-CM | POA: Diagnosis not present

## 2020-01-04 DIAGNOSIS — M6281 Muscle weakness (generalized): Secondary | ICD-10-CM | POA: Diagnosis not present

## 2020-01-05 DIAGNOSIS — J301 Allergic rhinitis due to pollen: Secondary | ICD-10-CM | POA: Diagnosis not present

## 2020-01-08 DIAGNOSIS — J301 Allergic rhinitis due to pollen: Secondary | ICD-10-CM | POA: Diagnosis not present

## 2020-01-12 DIAGNOSIS — M6281 Muscle weakness (generalized): Secondary | ICD-10-CM | POA: Diagnosis not present

## 2020-01-12 DIAGNOSIS — J301 Allergic rhinitis due to pollen: Secondary | ICD-10-CM | POA: Diagnosis not present

## 2020-01-12 DIAGNOSIS — M25512 Pain in left shoulder: Secondary | ICD-10-CM | POA: Diagnosis not present

## 2020-01-12 DIAGNOSIS — Z9889 Other specified postprocedural states: Secondary | ICD-10-CM | POA: Diagnosis not present

## 2020-01-12 DIAGNOSIS — M25612 Stiffness of left shoulder, not elsewhere classified: Secondary | ICD-10-CM | POA: Diagnosis not present

## 2020-01-13 DIAGNOSIS — J301 Allergic rhinitis due to pollen: Secondary | ICD-10-CM | POA: Diagnosis not present

## 2020-01-14 DIAGNOSIS — G894 Chronic pain syndrome: Secondary | ICD-10-CM | POA: Diagnosis not present

## 2020-01-14 DIAGNOSIS — M791 Myalgia, unspecified site: Secondary | ICD-10-CM | POA: Diagnosis not present

## 2020-01-14 DIAGNOSIS — Z79899 Other long term (current) drug therapy: Secondary | ICD-10-CM | POA: Diagnosis not present

## 2020-01-14 DIAGNOSIS — M47814 Spondylosis without myelopathy or radiculopathy, thoracic region: Secondary | ICD-10-CM | POA: Diagnosis not present

## 2020-01-14 DIAGNOSIS — M5412 Radiculopathy, cervical region: Secondary | ICD-10-CM | POA: Diagnosis not present

## 2020-01-15 DIAGNOSIS — J301 Allergic rhinitis due to pollen: Secondary | ICD-10-CM | POA: Diagnosis not present

## 2020-01-19 DIAGNOSIS — J301 Allergic rhinitis due to pollen: Secondary | ICD-10-CM | POA: Diagnosis not present

## 2020-01-20 DIAGNOSIS — M25512 Pain in left shoulder: Secondary | ICD-10-CM | POA: Diagnosis not present

## 2020-01-20 DIAGNOSIS — Z9889 Other specified postprocedural states: Secondary | ICD-10-CM | POA: Diagnosis not present

## 2020-01-20 DIAGNOSIS — M25612 Stiffness of left shoulder, not elsewhere classified: Secondary | ICD-10-CM | POA: Diagnosis not present

## 2020-01-20 DIAGNOSIS — M6281 Muscle weakness (generalized): Secondary | ICD-10-CM | POA: Diagnosis not present

## 2020-01-26 DIAGNOSIS — J301 Allergic rhinitis due to pollen: Secondary | ICD-10-CM | POA: Diagnosis not present

## 2020-01-28 DIAGNOSIS — M25512 Pain in left shoulder: Secondary | ICD-10-CM | POA: Diagnosis not present

## 2020-01-28 DIAGNOSIS — M6281 Muscle weakness (generalized): Secondary | ICD-10-CM | POA: Diagnosis not present

## 2020-01-28 DIAGNOSIS — M25612 Stiffness of left shoulder, not elsewhere classified: Secondary | ICD-10-CM | POA: Diagnosis not present

## 2020-01-28 DIAGNOSIS — Z9889 Other specified postprocedural states: Secondary | ICD-10-CM | POA: Diagnosis not present

## 2020-01-29 DIAGNOSIS — J301 Allergic rhinitis due to pollen: Secondary | ICD-10-CM | POA: Diagnosis not present

## 2020-02-01 ENCOUNTER — Other Ambulatory Visit: Payer: Medicare Other

## 2020-02-02 DIAGNOSIS — J301 Allergic rhinitis due to pollen: Secondary | ICD-10-CM | POA: Diagnosis not present

## 2020-02-04 DIAGNOSIS — M25612 Stiffness of left shoulder, not elsewhere classified: Secondary | ICD-10-CM | POA: Diagnosis not present

## 2020-02-04 DIAGNOSIS — Z9889 Other specified postprocedural states: Secondary | ICD-10-CM | POA: Diagnosis not present

## 2020-02-04 DIAGNOSIS — M25512 Pain in left shoulder: Secondary | ICD-10-CM | POA: Diagnosis not present

## 2020-02-04 DIAGNOSIS — M6281 Muscle weakness (generalized): Secondary | ICD-10-CM | POA: Diagnosis not present

## 2020-02-05 DIAGNOSIS — J301 Allergic rhinitis due to pollen: Secondary | ICD-10-CM | POA: Diagnosis not present

## 2020-02-09 DIAGNOSIS — J301 Allergic rhinitis due to pollen: Secondary | ICD-10-CM | POA: Diagnosis not present

## 2020-02-09 DIAGNOSIS — M6281 Muscle weakness (generalized): Secondary | ICD-10-CM | POA: Diagnosis not present

## 2020-02-09 DIAGNOSIS — Z9889 Other specified postprocedural states: Secondary | ICD-10-CM | POA: Diagnosis not present

## 2020-02-09 DIAGNOSIS — M25612 Stiffness of left shoulder, not elsewhere classified: Secondary | ICD-10-CM | POA: Diagnosis not present

## 2020-02-09 DIAGNOSIS — M25512 Pain in left shoulder: Secondary | ICD-10-CM | POA: Diagnosis not present

## 2020-02-11 DIAGNOSIS — Z9889 Other specified postprocedural states: Secondary | ICD-10-CM | POA: Diagnosis not present

## 2020-02-11 DIAGNOSIS — M6281 Muscle weakness (generalized): Secondary | ICD-10-CM | POA: Diagnosis not present

## 2020-02-11 DIAGNOSIS — M25512 Pain in left shoulder: Secondary | ICD-10-CM | POA: Diagnosis not present

## 2020-02-11 DIAGNOSIS — M25612 Stiffness of left shoulder, not elsewhere classified: Secondary | ICD-10-CM | POA: Diagnosis not present

## 2020-02-12 ENCOUNTER — Other Ambulatory Visit: Payer: Self-pay

## 2020-02-12 ENCOUNTER — Other Ambulatory Visit
Admission: RE | Admit: 2020-02-12 | Discharge: 2020-02-12 | Disposition: A | Discharge status: Home / Self Care | Payer: Medicare Other | Attending: Cardiology | Admitting: Cardiology

## 2020-02-12 DIAGNOSIS — J301 Allergic rhinitis due to pollen: Secondary | ICD-10-CM | POA: Diagnosis not present

## 2020-02-12 DIAGNOSIS — E782 Mixed hyperlipidemia: Secondary | ICD-10-CM

## 2020-02-12 LAB — HEPATIC FUNCTION PANEL
ALT: 26 U/L (ref 0–44)
AST: 20 U/L (ref 15–41)
Albumin: 4 g/dL (ref 3.5–5.0)
Alkaline Phosphatase: 57 U/L (ref 38–126)
Bilirubin, Direct: 0.1 mg/dL (ref 0.0–0.2)
Indirect Bilirubin: 0.5 mg/dL (ref 0.3–0.9)
Total Bilirubin: 0.6 mg/dL (ref 0.3–1.2)
Total Protein: 7.1 g/dL (ref 6.5–8.1)

## 2020-02-12 LAB — LIPID PANEL
Cholesterol: 132 mg/dL (ref 0–200)
HDL: 47 mg/dL (ref >40)
LDL Cholesterol: 73 mg/dL (ref 0–99)
Total CHOL/HDL Ratio: 2.8 RATIO
Triglycerides: 59 mg/dL (ref <150)
VLDL: 12 mg/dL (ref 0–40)

## 2020-02-12 NOTE — Addendum Note (Signed)
Addended by: Jana Hakim on: 02/12/2020 08:26 AM   Modules accepted: Orders

## 2020-02-15 DIAGNOSIS — M6281 Muscle weakness (generalized): Secondary | ICD-10-CM | POA: Diagnosis not present

## 2020-02-15 DIAGNOSIS — Z9889 Other specified postprocedural states: Secondary | ICD-10-CM | POA: Diagnosis not present

## 2020-02-15 DIAGNOSIS — M25612 Stiffness of left shoulder, not elsewhere classified: Secondary | ICD-10-CM | POA: Diagnosis not present

## 2020-02-15 DIAGNOSIS — M25512 Pain in left shoulder: Secondary | ICD-10-CM | POA: Diagnosis not present

## 2020-02-16 DIAGNOSIS — J301 Allergic rhinitis due to pollen: Secondary | ICD-10-CM | POA: Diagnosis not present

## 2020-02-18 DIAGNOSIS — M25512 Pain in left shoulder: Secondary | ICD-10-CM | POA: Diagnosis not present

## 2020-02-18 DIAGNOSIS — M25612 Stiffness of left shoulder, not elsewhere classified: Secondary | ICD-10-CM | POA: Diagnosis not present

## 2020-02-18 DIAGNOSIS — Z9889 Other specified postprocedural states: Secondary | ICD-10-CM | POA: Diagnosis not present

## 2020-02-18 DIAGNOSIS — M6281 Muscle weakness (generalized): Secondary | ICD-10-CM | POA: Diagnosis not present

## 2020-02-19 DIAGNOSIS — J301 Allergic rhinitis due to pollen: Secondary | ICD-10-CM | POA: Diagnosis not present

## 2020-02-22 ENCOUNTER — Other Ambulatory Visit: Payer: Self-pay

## 2020-02-22 ENCOUNTER — Ambulatory Visit (INDEPENDENT_AMBULATORY_CARE_PROVIDER_SITE_OTHER): Payer: Medicare Other | Admitting: Cardiovascular Disease

## 2020-02-22 ENCOUNTER — Encounter: Payer: Self-pay | Admitting: Cardiovascular Disease

## 2020-02-22 VITALS — BP 124/72 | HR 104 | Ht 66.0 in | Wt 177.0 lb

## 2020-02-22 DIAGNOSIS — I10 Essential (primary) hypertension: Secondary | ICD-10-CM

## 2020-02-22 DIAGNOSIS — R000 Tachycardia, unspecified: Secondary | ICD-10-CM

## 2020-02-22 DIAGNOSIS — I25119 Atherosclerotic heart disease of native coronary artery with unspecified angina pectoris: Secondary | ICD-10-CM

## 2020-02-22 DIAGNOSIS — E782 Mixed hyperlipidemia: Secondary | ICD-10-CM | POA: Diagnosis not present

## 2020-02-22 DIAGNOSIS — I2510 Atherosclerotic heart disease of native coronary artery without angina pectoris: Secondary | ICD-10-CM

## 2020-02-22 MED ORDER — ASPIRIN EC 81 MG PO TBEC: 81.0000 mg | DELAYED_RELEASE_TABLET | Freq: Every day | ORAL | 3 refills | Status: DC

## 2020-02-22 NOTE — Progress Notes (Signed)
Cardiology Office Note:    Date:  02/22/2020   ID:  [name] Caul., DOB 11-25-1970, MRN [id number]  PCP:  Sofie Hartigan, MD  Cardiologist:  Sherren Mocha, MD  Electrophysiologist:  None   Referring MD: Sofie Hartigan, MD   Chief Complaint  Patient presents with  . Coronary Artery Disease    History of Present Illness:    [name] Skaggs Underhill Brooke Bonito. is a 49 y.o. male with a hx of CAD, presenting for follow-up evaluation.  The patient was diagnosed with coronary artery disease after an incidental finding of coronary calcification on a chest CT.  An echocardiogram showed normal LV function with no valvular abnormalities.  The nuclear stress test showed good exercise tolerance with normal perfusion.  Comorbid conditions include type 2 diabetes, essential hypertension, and mixed hyperlipidemia.  The patient has been noted to have a strong family history of premature coronary artery disease in his male relatives.  A gated coronary CTA was done as the patient continued to have chest pain symptoms.  This showed age advanced but nonobstructive coronary artery disease with a high coronary calcium score (98th percentile) and less than 50% stenoses in the LAD and diagonal branches.  The patient is here alone today.  He is doing pretty well and denies any exertional symptoms.  He specifically denies chest pain, chest pressure, shortness of breath.  He does have occasional heart palpitations.  Sometimes he can feel his heart beating hard.  He has no orthopnea, PND, lightheadedness, or syncope.  He has started on some new medications for intractable headache.  Past Medical History:  Diagnosis Date  . Chronic kidney disease    H/O KIDNEY STONES  . Chronic pain   . Diabetes mellitus without complication ([place])   . DJD (degenerative joint disease)   . GERD (gastroesophageal reflux disease)   . Headache   . Hyperlipidemia   . Hypertension   . Hypogonadism male     Past Surgical History:    Procedure Laterality Date  . BICEPT TENODESIS Left 12/30/2019   Procedure: MINI BICEPS TENODESIS;  Surgeon: Corky Mull, MD;  Location: [facility] ORS;  Service: Orthopedics;  Laterality: Left;  . COLONOSCOPY WITH PROPOFOL N/A 01/05/2019   Procedure: COLONOSCOPY WITH PROPOFOL;  Surgeon: Lollie Sails, MD;  Location[facility];  Service: Endoscopy;  Laterality: N/A;  . ESOPHAGOGASTRODUODENOSCOPY (EGD) WITH PROPOFOL N/A 01/05/2019   Procedure: ESOPHAGOGASTRODUODENOSCOPY (EGD) WITH PROPOFOL;  Surgeon: Lollie Sails, MD;  Location: [facility] ENDOSCOPY;  Service: Endoscopy;  Laterality: N/A;  . EXTRACORPOREAL SHOCK WAVE LITHOTRIPSY Right 05/14/2019   Procedure: EXTRACORPOREAL SHOCK WAVE LITHOTRIPSY (ESWL);  Surgeon: [name], MD;  Location: [facility] ORS;  Service: Urology;  Laterality: Right;  . KNEE ARTHROSCOPY    . KNEE ARTHROSCOPY WITH MENISCAL REPAIR Right 01/19/2016   Procedure: KNEE ARTHROSCOPY WITH MENISCAL REPAIR;  Surgeon: Corky Mull, MD;  Location: [facility] ORS;  Service: Orthopedics;  Laterality: Right;  . KNEE ARTHROSCOPY WITH MENISCAL REPAIR Right 06/12/2016   Procedure: KNEE ARTHROSCOPY WITH PARTIAL MEDIAL MENISCAL REPAIR;  Surgeon: Corky Mull, MD;  Location: [facility] ORS;  Service: Orthopedics;  Laterality: Right;  . PARATHYROIDECTOMY    . SHOULDER ARTHROSCOPY WITH ROTATOR CUFF REPAIR Left 12/30/2019   Procedure: SHOULDER ARTHROSCOPY WITH ROTATOR CUFF REPAIR;  Surgeon: Corky Mull, MD;  Location: [facility] ORS;  Service: Orthopedics;  Laterality: Left;  . SHOULDER SURGERY Bilateral   . SPINAL CORD STIMULATOR BATTERY EXCHANGE    . SPINAL CORD  STIMULATOR IMPLANT  2008  . SPINAL FUSION     L4-L5 (2004), L5-S1 (2006)  . THYROIDECTOMY, PARTIAL    . TONSILLECTOMY      Current Medications: Current Meds  Medication Sig  . acyclovir (ZOVIRAX) 200 MG capsule Take 200 mg by mouth 2 (two) times daily as needed (fever blisters).   Marland Kitchen atorvastatin (LIPITOR) 40 MG tablet Take 1 tablet (40 mg  total) by mouth daily.  . cholecalciferol (VITAMIN D) 1000 units tablet Take 1,000 Units by mouth daily.  Marland Kitchen EPINEPHrine 0.3 mg/0.3 mL IJ SOAJ injection Inject 0.3 mg into the muscle as needed for anaphylaxis.  . hydrOXYzine (ATARAX/VISTARIL) 10 MG tablet Take 10 mg by mouth 3 (three) times daily as needed for itching.   Marland Kitchen lisinopril (ZESTRIL) 10 MG tablet Take 1 tablet (10 mg total) by mouth daily.  . metFORMIN (GLUCOPHAGE-XR) 500 MG 24 hr tablet Take 500 mg by mouth 2 (two) times daily.  . metoprolol succinate (TOPROL-XL) 50 MG 24 hr tablet Take 50 mg by mouth daily.  . nortriptyline (PAMELOR) 50 MG capsule Take 50 mg by mouth at bedtime.   . OXYCONTIN 10 MG 12 hr tablet Take 10 mg by mouth in the morning and at bedtime.  . pantoprazole (PROTONIX) 40 MG tablet Take 40 mg by mouth daily.  . SUMAtriptan (IMITREX) 100 MG tablet Take 100 mg by mouth every 2 (two) hours as needed for migraine.   . tamsulosin (FLOMAX) 0.4 MG CAPS capsule Take 1 capsule (0.4 mg total) by mouth daily.  Marland Kitchen testosterone cypionate (DEPOTESTOSTERONE CYPIONATE) 200 MG/ML injection Inject 200 mg into the muscle every 7 (seven) days.  . traZODone (DESYREL) 50 MG tablet Take 25 mg by mouth at bedtime as needed for sleep.   Marland Kitchen venlafaxine (EFFEXOR) 37.5 MG tablet Take 37.5 mg by mouth daily.     Allergies:   Pravastatin and Celexa [citalopram]   Social History   Socioeconomic History  . Marital status: Married    Spouse name: Not on file  . Number of children: Not on file  . Years of education: Not on file  . Highest education level: Not on file  Occupational History  . Not on file  Tobacco Use  . Smoking status: Never Smoker  . Smokeless tobacco: Never Used  Substance and Sexual Activity  . Alcohol use: No  . Drug use: No  . Sexual activity: Yes    Birth control/protection: None  Other Topics Concern  . Not on file  Social History Narrative  . Not on file   Social Determinants of Health   Financial  Resource Strain:   . Difficulty of Paying Living Expenses:   Food Insecurity:   . Worried About Charity fundraiser in the Last Year:   . Arboriculturist in the Last Year:   Transportation Needs:   . Film/video editor (Medical):   Marland Kitchen Lack of Transportation (Non-Medical):   Physical Activity:   . Days of Exercise per Week:   . Minutes of Exercise per Session:   Stress:   . Feeling of Stress :   Social Connections:   . Frequency of Communication with Friends and Family:   . Frequency of Social Gatherings with Friends and Family:   . Attends Religious Services:   . Active Member of Clubs or Organizations:   . Attends Archivist Meetings:   Marland Kitchen Marital Status:      Family History: The patient's family history is negative  for Bladder Cancer, Kidney cancer, and Prostate cancer.  ROS:   Please see the history of present illness.    All other systems reviewed and are negative.  EKGs/Labs/Other Studies Reviewed:    The following studies were reviewed today: Coronary CTA 04/21/2019: IMPRESSION: 1. Calcium score 257 which is 30 th percentile for age and sex  2. CAD RADS 2 age advanced but non obstructive CAD involving proximal LAD, circumflex and OM1  3.  Normal aortic root 3.1 cm   EKG:  EKG is not ordered today.    Recent Labs: 04/21/2019: Creatinine, Ser 1.00 02/12/2020: ALT 26  Recent Lipid Panel    Component Value Date/Time   CHOL 132 02/12/2020 0830   TRIG 59 02/12/2020 0830   HDL 47 02/12/2020 0830   CHOLHDL 2.8 02/12/2020 0830   VLDL 12 02/12/2020 0830   LDLCALC 73 02/12/2020 0830    Physical Exam:    VS:  BP 124/72   Pulse (!) 104   Ht 5\' 6"  (1.676 m)   Wt 177 lb (80.3 kg)   SpO2 98%   BMI 28.57 kg/m     Wt Readings from Last 3 Encounters:  02/22/20 177 lb (80.3 kg)  12/28/19 165 lb (74.8 kg)  11/05/19 166 lb 12.8 oz (75.7 kg)     GEN:  Well nourished, well developed in no acute distress HEENT: Normal NECK: No JVD; No carotid  bruits LYMPHATICS: No lymphadenopathy CARDIAC: RRR, no murmurs, rubs, gallops RESPIRATORY:  Clear to auscultation without rales, wheezing or rhonchi  ABDOMEN: Soft, non-tender, non-distended MUSCULOSKELETAL:  No edema; No deformity  SKIN: Warm and dry NEUROLOGIC:  Alert and oriented x 3 PSYCHIATRIC:  Normal affect   ASSESSMENT:    1. Essential (primary) hypertension   2. Mixed hyperlipidemia   3. Coronary artery disease involving native coronary artery of native heart without angina pectoris   4. Sinus tachycardia    PLAN:    In order of problems listed above:  1. Blood pressure is well controlled on lisinopril and metoprolol succinate.  He has a strong desire to minimize medicines, but I think for now it is best for him to remain on both of these medications.  See below for discussion of tachycardia. 2. Recent lipids reviewed and he continues on atorvastatin 40 mg daily.  Cholesterol 132, HDL 47, LDL 73.  ALT is 26. 3. The patient is stable without symptoms of angina.  I reviewed his CT scan as outlined above.  I asked him to start aspirin 81 mg daily. 4. Unclear etiology.  I did review his medications and it is possible that venlafaxine is contributing as this is reported in some patients who take this medicine.  He will review this with neurology at the time of follow-up and consider discontinuation of this drug.   Medication Adjustments/Labs and Tests Ordered: Current medicines are reviewed at length with the patient today.  Concerns regarding medicines are outlined above.  No orders of the defined types were placed in this encounter.  Meds ordered this encounter  Medications  . aspirin EC 81 MG tablet    Sig: Take 1 tablet (81 mg total) by mouth daily.    Dispense:  90 tablet    Refill:  3    Patient Instructions  Medication Instructions:  1) START ASPIRIN EC (enteric coated) 81 mg daily  *If you need a refill on your cardiac medications before your next appointment,  please call your pharmacy*   Follow-Up: At [facility],  you and your health needs are our priority.  As part of our continuing mission to provide you with exceptional heart care, we have created designated Provider Care Teams.  These Care Teams include your primary Cardiologist (physician) and Advanced Practice Providers (APPs -  Physician Assistants and Nurse Practitioners) who all work together to provide you with the care you need, when you need it. Your next appointment:   6 month(s) The format for your next appointment:   In Person Provider:   You may see Sherren Mocha, MD or one of the following Advanced Practice Providers on your designated Care Team:    Richardson Dopp, PA-C  Robbie Lis, [place]      Signed, Sherren Mocha, MD  02/22/2020 2:04 PM    [place]

## 2020-02-22 NOTE — Patient Instructions (Signed)
Medication Instructions:  1) START ASPIRIN EC (enteric coated) 81 mg daily  *If you need a refill on your cardiac medications before your next appointment, please call your pharmacy*   Follow-Up: At [facility], you and your health needs are our priority.  As part of our continuing mission to provide you with exceptional heart care, we have created designated Provider Care Teams.  These Care Teams include your primary Cardiologist (physician) and Advanced Practice Providers (APPs -  Physician Assistants and Nurse Practitioners) who all work together to provide you with the care you need, when you need it. Your next appointment:   6 month(s) The format for your next appointment:   In Person Provider:   You may see Sherren Mocha, MD or one of the following Advanced Practice Providers on your designated Care Team:    Richardson Dopp, PA-C  [place], [place]

## 2020-02-23 DIAGNOSIS — M25612 Stiffness of left shoulder, not elsewhere classified: Secondary | ICD-10-CM | POA: Diagnosis not present

## 2020-02-23 DIAGNOSIS — J301 Allergic rhinitis due to pollen: Secondary | ICD-10-CM | POA: Diagnosis not present

## 2020-02-23 DIAGNOSIS — M6281 Muscle weakness (generalized): Secondary | ICD-10-CM | POA: Diagnosis not present

## 2020-02-23 DIAGNOSIS — M25512 Pain in left shoulder: Secondary | ICD-10-CM | POA: Diagnosis not present

## 2020-02-23 DIAGNOSIS — Z9889 Other specified postprocedural states: Secondary | ICD-10-CM | POA: Diagnosis not present

## 2020-02-24 DIAGNOSIS — J301 Allergic rhinitis due to pollen: Secondary | ICD-10-CM | POA: Diagnosis not present

## 2020-02-25 DIAGNOSIS — M6281 Muscle weakness (generalized): Secondary | ICD-10-CM | POA: Diagnosis not present

## 2020-02-25 DIAGNOSIS — M25612 Stiffness of left shoulder, not elsewhere classified: Secondary | ICD-10-CM | POA: Diagnosis not present

## 2020-02-25 DIAGNOSIS — Z9889 Other specified postprocedural states: Secondary | ICD-10-CM | POA: Diagnosis not present

## 2020-02-25 DIAGNOSIS — M25512 Pain in left shoulder: Secondary | ICD-10-CM | POA: Diagnosis not present

## 2020-02-25 DIAGNOSIS — G44319 Acute post-traumatic headache, not intractable: Secondary | ICD-10-CM | POA: Diagnosis not present

## 2020-02-26 DIAGNOSIS — J301 Allergic rhinitis due to pollen: Secondary | ICD-10-CM | POA: Diagnosis not present

## 2020-02-29 DIAGNOSIS — M25512 Pain in left shoulder: Secondary | ICD-10-CM | POA: Diagnosis not present

## 2020-02-29 DIAGNOSIS — M25612 Stiffness of left shoulder, not elsewhere classified: Secondary | ICD-10-CM | POA: Diagnosis not present

## 2020-02-29 DIAGNOSIS — Z9889 Other specified postprocedural states: Secondary | ICD-10-CM | POA: Diagnosis not present

## 2020-02-29 DIAGNOSIS — M6281 Muscle weakness (generalized): Secondary | ICD-10-CM | POA: Diagnosis not present

## 2020-03-07 DIAGNOSIS — G43719 Chronic migraine without aura, intractable, without status migrainosus: Secondary | ICD-10-CM | POA: Diagnosis not present

## 2020-03-08 DIAGNOSIS — M6281 Muscle weakness (generalized): Secondary | ICD-10-CM | POA: Diagnosis not present

## 2020-03-08 DIAGNOSIS — M25512 Pain in left shoulder: Secondary | ICD-10-CM | POA: Diagnosis not present

## 2020-03-08 DIAGNOSIS — M25612 Stiffness of left shoulder, not elsewhere classified: Secondary | ICD-10-CM | POA: Diagnosis not present

## 2020-03-08 DIAGNOSIS — Z9889 Other specified postprocedural states: Secondary | ICD-10-CM | POA: Diagnosis not present

## 2020-03-08 DIAGNOSIS — J301 Allergic rhinitis due to pollen: Secondary | ICD-10-CM | POA: Diagnosis not present

## 2020-03-10 DIAGNOSIS — M6281 Muscle weakness (generalized): Secondary | ICD-10-CM | POA: Diagnosis not present

## 2020-03-10 DIAGNOSIS — M25512 Pain in left shoulder: Secondary | ICD-10-CM | POA: Diagnosis not present

## 2020-03-10 DIAGNOSIS — Z9889 Other specified postprocedural states: Secondary | ICD-10-CM | POA: Diagnosis not present

## 2020-03-10 DIAGNOSIS — M25612 Stiffness of left shoulder, not elsewhere classified: Secondary | ICD-10-CM | POA: Diagnosis not present

## 2020-03-11 DIAGNOSIS — J301 Allergic rhinitis due to pollen: Secondary | ICD-10-CM | POA: Diagnosis not present

## 2020-03-14 ENCOUNTER — Encounter: Payer: Self-pay | Admitting: *Deleted

## 2020-03-14 DIAGNOSIS — R000 Tachycardia, unspecified: Secondary | ICD-10-CM

## 2020-03-14 DIAGNOSIS — R002 Palpitations: Secondary | ICD-10-CM

## 2020-03-14 NOTE — Progress Notes (Signed)
Patient ID: [name] Slipper Sinclair Ship., male   DOB: Jul 13, 1971, 49 y.o.   MRN: [id number] 3 day ZIO XT shipped to patients home.

## 2020-03-15 DIAGNOSIS — G894 Chronic pain syndrome: Secondary | ICD-10-CM | POA: Diagnosis not present

## 2020-03-15 DIAGNOSIS — M47814 Spondylosis without myelopathy or radiculopathy, thoracic region: Secondary | ICD-10-CM | POA: Diagnosis not present

## 2020-03-15 DIAGNOSIS — Z79899 Other long term (current) drug therapy: Secondary | ICD-10-CM | POA: Diagnosis not present

## 2020-03-15 DIAGNOSIS — J301 Allergic rhinitis due to pollen: Secondary | ICD-10-CM | POA: Diagnosis not present

## 2020-03-15 DIAGNOSIS — M5412 Radiculopathy, cervical region: Secondary | ICD-10-CM | POA: Diagnosis not present

## 2020-03-15 DIAGNOSIS — M791 Myalgia, unspecified site: Secondary | ICD-10-CM | POA: Diagnosis not present

## 2020-03-17 ENCOUNTER — Ambulatory Visit (INDEPENDENT_AMBULATORY_CARE_PROVIDER_SITE_OTHER): Payer: Medicare Other

## 2020-03-17 DIAGNOSIS — Z9889 Other specified postprocedural states: Secondary | ICD-10-CM | POA: Diagnosis not present

## 2020-03-17 DIAGNOSIS — R000 Tachycardia, unspecified: Secondary | ICD-10-CM

## 2020-03-17 DIAGNOSIS — M25512 Pain in left shoulder: Secondary | ICD-10-CM | POA: Diagnosis not present

## 2020-03-17 DIAGNOSIS — M6281 Muscle weakness (generalized): Secondary | ICD-10-CM | POA: Diagnosis not present

## 2020-03-17 DIAGNOSIS — R002 Palpitations: Secondary | ICD-10-CM

## 2020-03-17 DIAGNOSIS — M25612 Stiffness of left shoulder, not elsewhere classified: Secondary | ICD-10-CM | POA: Diagnosis not present

## 2020-03-18 DIAGNOSIS — J301 Allergic rhinitis due to pollen: Secondary | ICD-10-CM | POA: Diagnosis not present

## 2020-03-22 DIAGNOSIS — M25612 Stiffness of left shoulder, not elsewhere classified: Secondary | ICD-10-CM | POA: Diagnosis not present

## 2020-03-22 DIAGNOSIS — M25512 Pain in left shoulder: Secondary | ICD-10-CM | POA: Diagnosis not present

## 2020-03-22 DIAGNOSIS — Z9889 Other specified postprocedural states: Secondary | ICD-10-CM | POA: Diagnosis not present

## 2020-03-22 DIAGNOSIS — M6281 Muscle weakness (generalized): Secondary | ICD-10-CM | POA: Diagnosis not present

## 2020-03-22 DIAGNOSIS — J301 Allergic rhinitis due to pollen: Secondary | ICD-10-CM | POA: Diagnosis not present

## 2020-03-25 DIAGNOSIS — J301 Allergic rhinitis due to pollen: Secondary | ICD-10-CM | POA: Diagnosis not present

## 2020-03-29 DIAGNOSIS — J301 Allergic rhinitis due to pollen: Secondary | ICD-10-CM | POA: Diagnosis not present

## 2020-04-10 DIAGNOSIS — R002 Palpitations: Secondary | ICD-10-CM | POA: Diagnosis not present

## 2020-04-13 ENCOUNTER — Other Ambulatory Visit: Payer: Self-pay | Admitting: Urology

## 2020-04-13 DIAGNOSIS — R109 Unspecified abdominal pain: Secondary | ICD-10-CM

## 2020-04-14 ENCOUNTER — Other Ambulatory Visit: Payer: Self-pay

## 2020-04-14 ENCOUNTER — Other Ambulatory Visit
Admission: RE | Admit: 2020-04-14 | Discharge: 2020-04-14 | Disposition: A | Discharge status: Home / Self Care | Payer: Medicare Other | Source: Home / Self Care | Attending: Urology | Admitting: Urology

## 2020-04-14 ENCOUNTER — Ambulatory Visit
Admission: RE | Admit: 2020-04-14 | Discharge: 2020-04-14 | Disposition: A | Discharge status: Home / Self Care | Payer: Medicare Other | Source: Ambulatory Visit | Attending: Urology | Admitting: Urology

## 2020-04-14 ENCOUNTER — Ambulatory Visit
Admission: RE | Admit: 2020-04-14 | Discharge: 2020-04-14 | Disposition: A | Discharge status: Home / Self Care | Payer: Medicare Other | Attending: Urology | Admitting: Urology

## 2020-04-14 DIAGNOSIS — Z87442 Personal history of urinary calculi: Secondary | ICD-10-CM | POA: Diagnosis not present

## 2020-04-14 DIAGNOSIS — R109 Unspecified abdominal pain: Secondary | ICD-10-CM | POA: Insufficient documentation

## 2020-04-14 LAB — URINALYSIS, COMPLETE (UACMP) WITH MICROSCOPIC
Bilirubin Urine: NEGATIVE
Glucose, UA: 500 mg/dL — AB
Hgb urine dipstick: NEGATIVE
Ketones, ur: NEGATIVE mg/dL
Leukocytes,Ua: NEGATIVE
Nitrite: NEGATIVE
Protein, ur: NEGATIVE mg/dL
RBC / HPF: NONE SEEN RBC/hpf (ref 0–5)
Specific Gravity, Urine: 1.01 (ref 1.005–1.030)
pH: 6 (ref 5.0–8.0)

## 2020-04-18 ENCOUNTER — Other Ambulatory Visit: Payer: Self-pay | Admitting: *Deleted

## 2020-04-18 DIAGNOSIS — N200 Calculus of kidney: Secondary | ICD-10-CM

## 2020-04-18 NOTE — Addendum Note (Signed)
Addended by: Despina Hidden on: 04/18/2020 03:50 PM   Modules accepted: Orders

## 2020-04-19 ENCOUNTER — Ambulatory Visit (INDEPENDENT_AMBULATORY_CARE_PROVIDER_SITE_OTHER): Payer: Medicare Other | Admitting: Urology

## 2020-04-19 ENCOUNTER — Ambulatory Visit: Payer: Medicare Other | Admitting: Urology

## 2020-04-19 ENCOUNTER — Encounter: Payer: Self-pay | Admitting: Urology

## 2020-04-19 ENCOUNTER — Other Ambulatory Visit: Payer: Self-pay

## 2020-04-19 VITALS — BP 124/80 | HR 101 | Ht 66.0 in | Wt 178.0 lb

## 2020-04-19 DIAGNOSIS — N200 Calculus of kidney: Secondary | ICD-10-CM

## 2020-04-19 NOTE — Progress Notes (Signed)
   04/19/2020 1:51 PM   [name] Slipper Brickey Jr. 1971-07-03 [id number]  Reason for visit: Follow up nephrolithiasis  HPI: I saw Mr. Obryant in urology clinic for follow-up of nephrolithiasis.  He is a 49 year old male with recurrent stones and history of shockwave lithotripsy.  He felt like he was passing a stone last week and was added to my schedule.  Urinalysis was completely benign with 0 RBCs, and KUB today shows no evidence of ureteral stones.  His last stone episode was in December 2020 and was quite severe, though he ultimately passed a 5 mm stone spontaneously.  We had a long conversation about stone prevention today, and I recommended pursuing further evaluation with a 24-hour urine test.  Stone type has been calcium oxalate in the past. We discussed general stone prevention strategies including adequate hydration with goal of producing 2.5 L of urine daily, increasing citric acid intake, increasing calcium intake during high oxalate meals, minimizing animal protein, and decreasing salt intake. Information about dietary recommendations given today.   Follow-up in 2 months to review 70-LIDC urine metabolic work-up   [name], [place] [address] [place], [place] [address] [phone]

## 2020-04-19 NOTE — Patient Instructions (Addendum)
Dietary Guidelines to Help Prevent Kidney Stones Kidney stones are deposits of minerals and salts that form inside your kidneys. Your risk of developing kidney stones may be greater depending on your diet, your lifestyle, the medicines you take, and whether you have certain medical conditions. Most people can reduce their chances of developing kidney stones by following the instructions below. Depending on your overall health and the type of kidney stones you tend to develop, your dietitian may give you more specific instructions. What are tips for following this plan? Reading food labels  Choose foods with "no salt added" or "low-salt" labels. Limit your sodium intake to less than 1500 mg per day.  Choose foods with calcium for each meal and snack. Try to eat about 300 mg of calcium at each meal. Foods that contain 200-500 mg of calcium per serving include: ? 8 oz (237 ml) of milk, fortified nondairy milk, and fortified fruit juice. ? 8 oz (237 ml) of kefir, yogurt, and soy yogurt. ? 4 oz (118 ml) of tofu. ? 1 oz of cheese. ? 1 cup (300 g) of dried figs. ? 1 cup (91 g) of cooked broccoli. ? 1-3 oz can of sardines or mackerel.  Most people need 1000 to 1500 mg of calcium each day. Talk to your dietitian about how much calcium is recommended for you. Shopping  Buy plenty of fresh fruits and vegetables. Most people do not need to avoid fruits and vegetables, even if they contain nutrients that may contribute to kidney stones.  When shopping for convenience foods, choose: ? Whole pieces of fruit. ? Premade salads with dressing on the side. ? Low-fat fruit and yogurt smoothies.  Avoid buying frozen meals or prepared deli foods.  Look for foods with live cultures, such as yogurt and kefir. Cooking  Do not add salt to food when cooking. Place a salt shaker on the table and allow each person to add his or her own salt to taste.  Use vegetable protein, such as beans, textured vegetable  protein (TVP), or tofu instead of meat in pasta, casseroles, and soups. Meal planning   Eat less salt, if told by your dietitian. To do this: ? Avoid eating processed or premade food. ? Avoid eating fast food.  Eat less animal protein, including cheese, meat, poultry, or fish, if told by your dietitian. To do this: ? Limit the number of times you have meat, poultry, fish, or cheese each week. Eat a diet free of meat at least 2 days a week. ? Eat only one serving each day of meat, poultry, fish, or seafood. ? When you prepare animal protein, cut pieces into small portion sizes. For most meat and fish, one serving is about the size of one deck of cards.  Eat at least 5 servings of fresh fruits and vegetables each day. To do this: ? Keep fruits and vegetables on hand for snacks. ? Eat 1 piece of fruit or a handful of berries with breakfast. ? Have a salad and fruit at lunch. ? Have two kinds of vegetables at dinner.  Limit foods that are high in a substance called oxalate. These include: ? Spinach. ? Rhubarb. ? Beets. ? Potato chips and french fries. ? Nuts.  If you regularly take a diuretic medicine, make sure to eat at least 1-2 fruits or vegetables high in potassium each day. These include: ? Avocado. ? Banana. ? Orange, prune, carrot, or tomato juice. ? Baked potato. ? Cabbage. ? Beans and split   peas. General instructions   Drink enough fluid to keep your urine clear or pale yellow. This is the most important thing you can do.  Talk to your health care provider and dietitian about taking daily supplements. Depending on your health and the cause of your kidney stones, you may be advised: ? Not to take supplements with vitamin C. ? To take a calcium supplement. ? To take a daily probiotic supplement. ? To take other supplements such as magnesium, fish oil, or vitamin B6.  Take all medicines and supplements as told by your health care provider.  Limit alcohol intake to no  more than 1 drink a day for nonpregnant women and 2 drinks a day for men. One drink equals 12 oz of beer, 5 oz of wine, or 1 oz of hard liquor.  Lose weight if told by your health care provider. Work with your dietitian to find strategies and an eating plan that works best for you. What foods are not recommended? Limit your intake of the following foods, or as told by your dietitian. Talk to your dietitian about specific foods you should avoid based on the type of kidney stones and your overall health. Grains Breads. Bagels. Rolls. Baked goods. Salted crackers. Cereal. Pasta. Vegetables Spinach. Rhubarb. Beets. Canned vegetables. [name] Brown. Olives. Meats and other protein foods Nuts. Nut butters. Large portions of meat, poultry, or fish. Salted or cured meats. Deli meats. Hot dogs. Sausages. Dairy Cheese. Beverages Regular soft drinks. Regular vegetable juice. Seasonings and other foods Seasoning blends with salt. Salad dressings. Canned soups. Soy sauce. Ketchup. Barbecue sauce. Canned pasta sauce. Casseroles. Pizza. Lasagna. Frozen meals. Potato chips. [place] fries. Summary  You can reduce your risk of kidney stones by making changes to your diet.  The most important thing you can do is drink enough fluid. You should drink enough fluid to keep your urine clear or pale yellow.  Ask your health care provider or dietitian how much protein from animal sources you should eat each day, and also how much salt and calcium you should have each day. This information is not intended to replace advice given to you by your health care provider. Make sure you discuss any questions you have with your health care provider. Document Revised: 01/28/2019 Document Reviewed: 09/18/2016 Elsevier Patient Education  2020 [facility]  You will receive a box/kit in the mail that will have a urine jug and instructions in the kit.  When the box arrives  you will need to call our office [phone] to schedule a LAB appointment.  You will need to do a 24hour urine and this should be done during the days that our office will be open.  For example any day from Sunday through Thursday.  If you take Vitamin C 168m or greater please stop this 5 days prior to collection.  How to collect the urine sample: On the day you start the urine sample this 1st morning urine should NOT be collected.  For the rest of the day including all night urines should be collected.  On the next morning the 1st urine should be collected and then you will be finished with the urine collections.  You will need to bring the box with you on your LAB appointment day after urine has been collected and all instructions are complete in the box.  Your blood will be drawn and the box will be collected by our Lab employee to be sent  for analysis.  When urine and blood is complete you will need to schedule a follow up appointment for lab results.   

## 2020-04-20 MED ORDER — METOPROLOL SUCCINATE ER 50 MG PO TB24: 50.0000 mg | ORAL_TABLET | Freq: Two times a day (BID) | ORAL | 3 refills | Status: DC

## 2020-04-22 DIAGNOSIS — E119 Type 2 diabetes mellitus without complications: Secondary | ICD-10-CM | POA: Diagnosis not present

## 2020-05-02 DIAGNOSIS — E291 Testicular hypofunction: Secondary | ICD-10-CM | POA: Diagnosis not present

## 2020-05-02 DIAGNOSIS — I152 Hypertension secondary to endocrine disorders: Secondary | ICD-10-CM | POA: Diagnosis not present

## 2020-05-02 DIAGNOSIS — E1159 Type 2 diabetes mellitus with other circulatory complications: Secondary | ICD-10-CM | POA: Diagnosis not present

## 2020-05-02 DIAGNOSIS — E785 Hyperlipidemia, unspecified: Secondary | ICD-10-CM | POA: Diagnosis not present

## 2020-05-02 DIAGNOSIS — E1169 Type 2 diabetes mellitus with other specified complication: Secondary | ICD-10-CM | POA: Diagnosis not present

## 2020-05-18 DIAGNOSIS — J40 Bronchitis, not specified as acute or chronic: Secondary | ICD-10-CM | POA: Diagnosis not present

## 2020-05-18 DIAGNOSIS — Z20822 Contact with and (suspected) exposure to covid-19: Secondary | ICD-10-CM | POA: Diagnosis not present

## 2020-05-30 DIAGNOSIS — M5412 Radiculopathy, cervical region: Secondary | ICD-10-CM | POA: Diagnosis not present

## 2020-05-30 DIAGNOSIS — G894 Chronic pain syndrome: Secondary | ICD-10-CM | POA: Diagnosis not present

## 2020-05-30 DIAGNOSIS — Z79899 Other long term (current) drug therapy: Secondary | ICD-10-CM | POA: Diagnosis not present

## 2020-05-30 DIAGNOSIS — M47814 Spondylosis without myelopathy or radiculopathy, thoracic region: Secondary | ICD-10-CM | POA: Diagnosis not present

## 2020-05-30 DIAGNOSIS — G43719 Chronic migraine without aura, intractable, without status migrainosus: Secondary | ICD-10-CM | POA: Diagnosis not present

## 2020-05-30 DIAGNOSIS — M791 Myalgia, unspecified site: Secondary | ICD-10-CM | POA: Diagnosis not present

## 2020-05-31 ENCOUNTER — Other Ambulatory Visit: Payer: Self-pay | Admitting: Urology

## 2020-05-31 DIAGNOSIS — B0052 Herpesviral keratitis: Secondary | ICD-10-CM | POA: Diagnosis not present

## 2020-06-07 ENCOUNTER — Ambulatory Visit: Payer: Medicare Other | Admitting: Urology

## 2020-06-10 DIAGNOSIS — B0052 Herpesviral keratitis: Secondary | ICD-10-CM | POA: Diagnosis not present

## 2020-06-17 DIAGNOSIS — E291 Testicular hypofunction: Secondary | ICD-10-CM | POA: Diagnosis not present

## 2020-06-17 DIAGNOSIS — M2550 Pain in unspecified joint: Secondary | ICD-10-CM | POA: Diagnosis not present

## 2020-06-17 DIAGNOSIS — K219 Gastro-esophageal reflux disease without esophagitis: Secondary | ICD-10-CM | POA: Diagnosis not present

## 2020-06-17 DIAGNOSIS — G8921 Chronic pain due to trauma: Secondary | ICD-10-CM | POA: Diagnosis not present

## 2020-06-17 DIAGNOSIS — G4701 Insomnia due to medical condition: Secondary | ICD-10-CM | POA: Diagnosis not present

## 2020-06-17 DIAGNOSIS — R05 Cough: Secondary | ICD-10-CM | POA: Diagnosis not present

## 2020-06-17 DIAGNOSIS — E7800 Pure hypercholesterolemia, unspecified: Secondary | ICD-10-CM | POA: Diagnosis not present

## 2020-06-17 DIAGNOSIS — E119 Type 2 diabetes mellitus without complications: Secondary | ICD-10-CM | POA: Diagnosis not present

## 2020-06-17 DIAGNOSIS — Z0000 Encounter for general adult medical examination without abnormal findings: Secondary | ICD-10-CM | POA: Diagnosis not present

## 2020-06-21 ENCOUNTER — Encounter: Payer: Self-pay | Admitting: Urology

## 2020-06-21 ENCOUNTER — Ambulatory Visit (INDEPENDENT_AMBULATORY_CARE_PROVIDER_SITE_OTHER): Payer: Medicare Other | Admitting: Urology

## 2020-06-21 ENCOUNTER — Ambulatory Visit: Payer: 59 | Admitting: Urology

## 2020-06-21 ENCOUNTER — Other Ambulatory Visit: Payer: Self-pay

## 2020-06-21 VITALS — BP 135/74 | HR 97 | Ht 66.0 in | Wt 173.0 lb

## 2020-06-21 DIAGNOSIS — J301 Allergic rhinitis due to pollen: Secondary | ICD-10-CM | POA: Diagnosis not present

## 2020-06-21 DIAGNOSIS — N200 Calculus of kidney: Secondary | ICD-10-CM | POA: Diagnosis not present

## 2020-06-21 NOTE — Progress Notes (Signed)
   06/21/2020 2:29 PM   [place]. 01/11/1971 [id number]  Reason for visit: Recurrent nephrolithiasis, discuss 24-hour urine results  HPI: I saw Mr. Amelia Jo in urology clinic to review his 24-hour urine results.  Briefly, he is a 49 year old male with history of recurrent stone disease, stone type has been calcium oxalate.  I last saw him in June 2021 when urinalysis was benign and KUB showed no evidence of stone disease.  24-hour urine is notable for a low urine volume of 1.84 L, borderline urine calcium of 245, excellent urine citrate of 671, normal urinary oxalate of 23, mildly elevated urine sodium of 157.  He was previously taking high doses of vitamin C during the pandemic, but stopped this prior to completing the 24-hour urine test.  We reviewed his 24-hour urine results and discussed options.  I recommended increasing urine volume with goal of 2.5 L/day, decreasing dietary sodium, and continuing high citrate foods for prevention.  I also discussed the option of adding indapamide for his borderline hypercalciuria, but he would like to hold off at this time which is very reasonable with his elevated sodium.  RTC 1 year with KUB and repeat 24-hour urine, sooner if problems   [name], [place] [address], [place], [place] [address] [phone]

## 2020-06-21 NOTE — Patient Instructions (Addendum)
Drink plenty of fluids to prevent kidney stones.  Goal is 2.5 L of urine output per day, and your urine should be clear.  Decrease salt in the diet.  Common sources are fast food, processed food, and frozen foods.  Do not take vitamin C and high doses, as this can be converted into stones.    Dietary Guidelines to Help Prevent Kidney Stones Kidney stones are deposits of minerals and salts that form inside your kidneys. Your risk of developing kidney stones may be greater depending on your diet, your lifestyle, the medicines you take, and whether you have certain medical conditions. Most people can reduce their chances of developing kidney stones by following the instructions below. Depending on your overall health and the type of kidney stones you tend to develop, your dietitian may give you more specific instructions. What are tips for following this plan? Reading food labels  Choose foods with "no salt added" or "low-salt" labels. Limit your sodium intake to less than 1500 mg per day.  Choose foods with calcium for each meal and snack. Try to eat about 300 mg of calcium at each meal. Foods that contain 200-500 mg of calcium per serving include: ? 8 oz (237 ml) of milk, fortified nondairy milk, and fortified fruit juice. ? 8 oz (237 ml) of kefir, yogurt, and soy yogurt. ? 4 oz (118 ml) of tofu. ? 1 oz of cheese. ? 1 cup (300 g) of dried figs. ? 1 cup (91 g) of cooked broccoli. ? 1-3 oz can of sardines or mackerel.  Most people need 1000 to 1500 mg of calcium each day. Talk to your dietitian about how much calcium is recommended for you. Shopping  Buy plenty of fresh fruits and vegetables. Most people do not need to avoid fruits and vegetables, even if they contain nutrients that may contribute to kidney stones.  When shopping for convenience foods, choose: ? Whole pieces of fruit. ? Premade salads with dressing on the side. ? Low-fat fruit and yogurt smoothies.  Avoid buying frozen  meals or prepared deli foods.  Look for foods with live cultures, such as yogurt and kefir. Cooking  Do not add salt to food when cooking. Place a salt shaker on the table and allow each person to add his or her own salt to taste.  Use vegetable protein, such as beans, textured vegetable protein (TVP), or tofu instead of meat in pasta, casseroles, and soups. Meal planning   Eat less salt, if told by your dietitian. To do this: ? Avoid eating processed or premade food. ? Avoid eating fast food.  Eat less animal protein, including cheese, meat, poultry, or fish, if told by your dietitian. To do this: ? Limit the number of times you have meat, poultry, fish, or cheese each week. Eat a diet free of meat at least 2 days a week. ? Eat only one serving each day of meat, poultry, fish, or seafood. ? When you prepare animal protein, cut pieces into small portion sizes. For most meat and fish, one serving is about the size of one deck of cards.  Eat at least 5 servings of fresh fruits and vegetables each day. To do this: ? Keep fruits and vegetables on hand for snacks. ? Eat 1 piece of fruit or a handful of berries with breakfast. ? Have a salad and fruit at lunch. ? Have two kinds of vegetables at dinner.  Limit foods that are high in a substance called oxalate. These  include: ? Spinach. ? Rhubarb. ? Beets. ? Potato chips and french fries. ? Nuts.  If you regularly take a diuretic medicine, make sure to eat at least 1-2 fruits or vegetables high in potassium each day. These include: ? Avocado. ? Banana. ? Orange, prune, carrot, or tomato juice. ? Baked potato. ? Cabbage. ? Beans and split peas. General instructions   Drink enough fluid to keep your urine clear or pale yellow. This is the most important thing you can do.  Talk to your health care provider and dietitian about taking daily supplements. Depending on your health and the cause of your kidney stones, you may be  advised: ? Not to take supplements with vitamin C. ? To take a calcium supplement. ? To take a daily probiotic supplement. ? To take other supplements such as magnesium, fish oil, or vitamin B6.  Take all medicines and supplements as told by your health care provider.  Limit alcohol intake to no more than 1 drink a day for nonpregnant women and 2 drinks a day for men. One drink equals 12 oz of beer, 5 oz of wine, or 1 oz of hard liquor.  Lose weight if told by your health care provider. Work with your dietitian to find strategies and an eating plan that works best for you. What foods are not recommended? Limit your intake of the following foods, or as told by your dietitian. Talk to your dietitian about specific foods you should avoid based on the type of kidney stones and your overall health. Grains Breads. Bagels. Rolls. Baked goods. Salted crackers. Cereal. Pasta. Vegetables Spinach. Rhubarb. Beets. Canned vegetables. [name] Brown. Olives. Meats and other protein foods Nuts. Nut butters. Large portions of meat, poultry, or fish. Salted or cured meats. Deli meats. Hot dogs. Sausages. Dairy Cheese. Beverages Regular soft drinks. Regular vegetable juice. Seasonings and other foods Seasoning blends with salt. Salad dressings. Canned soups. Soy sauce. Ketchup. Barbecue sauce. Canned pasta sauce. Casseroles. Pizza. Lasagna. Frozen meals. Potato chips. [place] fries. Summary  You can reduce your risk of kidney stones by making changes to your diet.  The most important thing you can do is drink enough fluid. You should drink enough fluid to keep your urine clear or pale yellow.  Ask your health care provider or dietitian how much protein from animal sources you should eat each day, and also how much salt and calcium you should have each day. This information is not intended to replace advice given to you by your health care provider. Make sure you discuss any questions you have with your health  care provider. Document Revised: 01/28/2019 Document Reviewed: 09/18/2016 Elsevier Patient Education  2020 [facility]  You will receive a box/kit in the mail that will have a urine jug and instructions in the kit.  When the box arrives you will need to call our office [phone] to schedule a LAB appointment.  You will need to do a 24hour urine and this should be done during the days that our office will be open.  For example any day from Sunday through Thursday.  If you take Vitamin C $RemoveB'100mg'fNDlKzCR$  or greater please stop this 5 days prior to collection.  How to collect the urine sample: On the day you start the urine sample this 1st morning urine should NOT be collected.  For the rest of the day including all night urines should be collected.  On the next morning the 1st urine  should be collected and then you will be finished with the urine collections.  You will need to bring the box with you on your LAB appointment day after urine has been collected and all instructions are complete in the box.  Your blood will be drawn and the box will be collected by our Lab employee to be sent off for analysis.  When urine and blood is complete you will need to schedule a follow up appointment for lab results.

## 2020-07-18 DIAGNOSIS — J301 Allergic rhinitis due to pollen: Secondary | ICD-10-CM | POA: Diagnosis not present

## 2020-07-19 DIAGNOSIS — B0052 Herpesviral keratitis: Secondary | ICD-10-CM | POA: Diagnosis not present

## 2020-07-20 DIAGNOSIS — G5601 Carpal tunnel syndrome, right upper limb: Secondary | ICD-10-CM | POA: Diagnosis not present

## 2020-08-01 DIAGNOSIS — E1169 Type 2 diabetes mellitus with other specified complication: Secondary | ICD-10-CM | POA: Diagnosis not present

## 2020-08-01 DIAGNOSIS — E119 Type 2 diabetes mellitus without complications: Secondary | ICD-10-CM | POA: Diagnosis not present

## 2020-08-01 DIAGNOSIS — E785 Hyperlipidemia, unspecified: Secondary | ICD-10-CM | POA: Diagnosis not present

## 2020-08-01 DIAGNOSIS — E291 Testicular hypofunction: Secondary | ICD-10-CM | POA: Diagnosis not present

## 2020-08-08 ENCOUNTER — Other Ambulatory Visit: Payer: Self-pay | Admitting: Urology

## 2020-08-25 ENCOUNTER — Encounter: Payer: Self-pay | Admitting: Physician Assistant

## 2020-08-25 NOTE — Progress Notes (Deleted)
Cardiology Office Note:    Date:  08/25/2020   ID:  [name] Caul., DOB October 30, 1970, MRN [id number]  PCP:  Sofie Hartigan, MD  [facility] Cardiologist:  Sherren Mocha, MD *** [place] Electrophysiologist:  None   Referring MD: Sofie Hartigan, MD   Chief Complaint:  No chief complaint on file.    Patient Profile:    [name] Mcclintock Teuscher Brooke Bonito. is a 49 y.o. male with:   Coronary artery disease   Coronary CTA 03/2019:  Non-obs CAD, Ca2+ score 257 (97th percentile)   Diabetes mellitus   Hypertension   Hyperlipidemia   FHx of CAD   Sinus tachycardia  Zio 6/21: Avg HR 105  Prior CV studies: Zio Monitor 04/11/20 1. The basic rhythm is normal sinus with an average HR of 105 bpm 2. No atrial fibrillation or flutter 3. No high-grade heart block or pathologic pauses 4. There are rare PVC's and rare supraventricular beats without sustained arrhythmias  Coronary CTA 04/21/2019 LAD prox 25-49; D2 prox 1-24 LCx prox and mid 25-49; OM1 prox 25-49 RCA prox and mid 1-24 Ca2+ score=257 (97th percentile) Non-obstructive CAD Aortic root 3.1 cm   Myoview 09/03/18 ([facility]) EF 58, normal perfusion  Echocardiogram 09/09/18 ([facility]) EF > 55, normal RVSF, mild MR, trace TR  History of Present Illness:    [name] Brown was last seen in 02/2020 by Dr. Burt Knack.  His HR was noted to be elevated.  An event monitor showed an Avg HR of 105.  His beta-blocker was increased.  He returns for follow up.  ***      Past Medical History:  Diagnosis Date  . Chronic kidney disease    H/O KIDNEY STONES  . Chronic pain   . Diabetes mellitus without complication ([place])   . DJD (degenerative joint disease)   . GERD (gastroesophageal reflux disease)   . Headache   . Hyperlipidemia   . Hypertension   . Hypogonadism male     Current Medications: No outpatient medications have been marked as taking for the 08/26/20 encounter (Appointment) with Richardson Dopp T, PA-C.      Allergies:   Pravastatin and Celexa [citalopram]   Social History   Tobacco Use  . Smoking status: Never Smoker  . Smokeless tobacco: Never Used  Vaping Use  . Vaping Use: Never used  Substance Use Topics  . Alcohol use: No  . Drug use: No     Family Hx: The patient's family history is negative for Bladder Cancer, Kidney cancer, and Prostate cancer.  ROS   EKGs/Labs/Other Test Reviewed:    EKG:  EKG is *** ordered today.  The ekg ordered today demonstrates ***  Recent Labs: 02/12/2020: ALT 26   Recent Lipid Panel Lab Results  Component Value Date/Time   CHOL 132 02/12/2020 08:30 AM   TRIG 59 02/12/2020 08:30 AM   HDL 47 02/12/2020 08:30 AM   CHOLHDL 2.8 02/12/2020 08:30 AM   LDLCALC 73 02/12/2020 08:30 AM      Risk Assessment/Calculations:   {Does this patient have ATRIAL FIBRILLATION?:[id number]}  Physical Exam:    VS:  There were no vitals taken for this visit.    Wt Readings from Last 3 Encounters:  06/21/20 173 lb (78.5 kg)  04/19/20 178 lb (80.7 kg)  02/22/20 177 lb (80.3 kg)     Physical Exam ***  ASSESSMENT & PLAN:    ***  Dispo:  No follow-ups on file.   Medication Adjustments/Labs  and Tests Ordered: Current medicines are reviewed at length with the patient today.  Concerns regarding medicines are outlined above.  Tests Ordered: No orders of the defined types were placed in this encounter.  Medication Changes: No orders of the defined types were placed in this encounter.   Signed, Richardson Dopp, PA-C  08/25/2020 8:23 AM    [facility] [place], [place], [place]  [address] Phone: [phone]; Fax: [phone]

## 2020-08-26 ENCOUNTER — Ambulatory Visit: Payer: 59 | Admitting: Physician Assistant

## 2020-08-29 DIAGNOSIS — M47814 Spondylosis without myelopathy or radiculopathy, thoracic region: Secondary | ICD-10-CM | POA: Diagnosis not present

## 2020-08-29 DIAGNOSIS — G894 Chronic pain syndrome: Secondary | ICD-10-CM | POA: Diagnosis not present

## 2020-08-29 DIAGNOSIS — Z79899 Other long term (current) drug therapy: Secondary | ICD-10-CM | POA: Diagnosis not present

## 2020-08-29 DIAGNOSIS — M5412 Radiculopathy, cervical region: Secondary | ICD-10-CM | POA: Diagnosis not present

## 2020-08-29 DIAGNOSIS — M791 Myalgia, unspecified site: Secondary | ICD-10-CM | POA: Diagnosis not present

## 2020-09-11 NOTE — Progress Notes (Signed)
Virtual Visit via Telephone Note   This visit type was conducted due to national recommendations for restrictions regarding the COVID-19 Pandemic (e.g. social distancing) in an effort to limit this patient's exposure and mitigate transmission in our community.  Due to his co-morbid illnesses, this patient is at least at moderate risk for complications without adequate follow up.  This format is felt to be most appropriate for this patient at this time.  The patient did not have access to video technology/had technical difficulties with video requiring transitioning to audio format only (telephone).  All issues noted in this document were discussed and addressed.  No physical exam could be performed with this format.  Please refer to the patient's chart for his  consent to telehealth for [facility]    Date:  09/12/2020   ID:  [name] Slipper Brophy Jr., DOB [birth year], MRN [id number] The patient was identified using 2 identifiers.  Patient Location: Home Provider Location: Home Office  PCP:  Sofie Hartigan, MD  Cardiologist:  Sherren Mocha, MD   Electrophysiologist:  None   Evaluation Performed:  Follow-Up Visit  Chief Complaint:  Follow-up (CAD, tachycardia)    Patient Profile: [name] Doughten Schild Brooke Bonito. is a 49 y.o. male with:  Coronary artery disease   Coronary CTA 03/2019:  Non-obs CAD, Ca2+ score 257 (97th percentile)   Diabetes mellitus   Hypertension   Hyperlipidemia   FHx of CAD   Sinus tachycardia  Zio 6/21: Avg HR 105  Prior CV studies: Zio Monitor 04/11/20 1. The basic rhythm is normal sinus with an average HR of 105 bpm 2. No atrial fibrillation or flutter 3. No high-grade heart block or pathologic pauses 4. There are rare PVC's and rare supraventricular beats without sustained arrhythmias  Coronary CTA 04/21/2019 LAD prox 25-49; D2 prox 1-24 LCx prox and mid 25-49; OM1 prox 25-49 RCA prox and mid 1-24 Ca2+ score=257 (97th percentile) Non-obstructive  CAD Aortic root 3.1 cm   Myoview 09/03/18 ([facility]) EF 58, normal perfusion  Echocardiogram 09/09/18 ([facility]) EF > 55, normal RVSF, mild MR, trace TR  History of Present Illness:    [name] Brown was last seen in 02/2020 by Dr. Burt Knack.  His HR was noted to be elevated.  An event monitor showed an Avg HR of 105.  His beta-blocker was increased.  He is seen for follow up.  Overall, his heart rate is improved.  However, he still has times where he feels that.  He feels like his heart to take a deep breath when this happens.  Otherwise, he really does not have shortness of breath with activity.  He has not had chest pain or syncope.  He does note fatigue.      Past Medical History:  Diagnosis Date  . CAD (coronary artery disease)   . Chronic pain   . Diabetes mellitus without complication ([place])   . DJD (degenerative joint disease)   . GERD (gastroesophageal reflux disease)   . Headache   . Hyperlipidemia   . Hypertension   . Hypogonadism male   . Kidney stones    Past Surgical History:  Procedure Laterality Date  . BICEPT TENODESIS Left 12/30/2019   Procedure: MINI BICEPS TENODESIS;  Surgeon: Corky Mull, MD;  Location: [facility] ORS;  Service: Orthopedics;  Laterality: Left;  . COLONOSCOPY WITH PROPOFOL N/A 01/05/2019   Procedure: COLONOSCOPY WITH PROPOFOL;  Surgeon: Lollie Sails, MD;  Location: [facility] ENDOSCOPY;  Service: Endoscopy;  Laterality: N/A;  .  ESOPHAGOGASTRODUODENOSCOPY (EGD) WITH PROPOFOL N/A 01/05/2019   Procedure: ESOPHAGOGASTRODUODENOSCOPY (EGD) WITH PROPOFOL;  Surgeon: Lollie Sails, MD;  Location: [facility] ENDOSCOPY;  Service: Endoscopy;  Laterality: N/A;  . EXTRACORPOREAL SHOCK WAVE LITHOTRIPSY Right 05/14/2019   Procedure: EXTRACORPOREAL SHOCK WAVE LITHOTRIPSY (ESWL);  Surgeon: [name], MD;  Location: [facility] ORS;  Service: Urology;  Laterality: Right;  . KNEE ARTHROSCOPY    . KNEE ARTHROSCOPY WITH MENISCAL REPAIR Right 01/19/2016   Procedure:  KNEE ARTHROSCOPY WITH MENISCAL REPAIR;  Surgeon: Corky Mull, MD;  Location: [facility] ORS;  Service: Orthopedics;  Laterality: Right;  . KNEE ARTHROSCOPY WITH MENISCAL REPAIR Right 06/12/2016   Procedure: KNEE ARTHROSCOPY WITH PARTIAL MEDIAL MENISCAL REPAIR;  Surgeon: Corky Mull, MD;  Location: [facility] ORS;  Service: Orthopedics;  Laterality: Right;  . PARATHYROIDECTOMY    . SHOULDER ARTHROSCOPY WITH ROTATOR CUFF REPAIR Left 12/30/2019   Procedure: SHOULDER ARTHROSCOPY WITH ROTATOR CUFF REPAIR;  Surgeon: Corky Mull, MD;  Location: [facility] ORS;  Service: Orthopedics;  Laterality: Left;  . SHOULDER SURGERY Bilateral   . SPINAL CORD STIMULATOR BATTERY EXCHANGE    . SPINAL CORD STIMULATOR IMPLANT  2008  . SPINAL FUSION     L4-L5 (2004), L5-S1 (2006)  . THYROIDECTOMY, PARTIAL    . TONSILLECTOMY       Current Meds  Medication Sig  . acyclovir (ZOVIRAX) 200 MG capsule Take 200 mg by mouth 2 (two) times daily as needed (fever blisters).   Marland Kitchen aspirin EC 81 MG tablet Take 1 tablet (81 mg total) by mouth daily.  Marland Kitchen atorvastatin (LIPITOR) 40 MG tablet Take 1 tablet (40 mg total) by mouth daily.  . cholecalciferol (VITAMIN D) 1000 units tablet Take 1,000 Units by mouth daily.  Marland Kitchen EPINEPHrine 0.3 mg/0.3 mL IJ SOAJ injection Inject 0.3 mg into the muscle as needed for anaphylaxis.  . hydrOXYzine (ATARAX/VISTARIL) 10 MG tablet Take 10 mg by mouth 3 (three) times daily as needed for itching.   . metFORMIN (GLUCOPHAGE-XR) 500 MG 24 hr tablet Take 500 mg by mouth 2 (two) times daily.  . metoprolol succinate (TOPROL-XL) 100 MG 24 hr tablet Take 1 tablet by mouth in the morning and take 0.5 tablet by mouth in the evening  . nortriptyline (PAMELOR) 50 MG capsule Take 50 mg by mouth at bedtime.   . OXYCONTIN 10 MG 12 hr tablet Take 10 mg by mouth in the morning and at bedtime.  . pantoprazole (PROTONIX) 40 MG tablet Take 40 mg by mouth daily.  . SUMAtriptan (IMITREX) 100 MG tablet Take 100 mg by mouth every 2 (two)  hours as needed for migraine.   . tamsulosin (FLOMAX) 0.4 MG CAPS capsule Take 1 capsule (0.4 mg total) by mouth daily.  Marland Kitchen testosterone cypionate (DEPOTESTOSTERONE CYPIONATE) 200 MG/ML injection Inject 200 mg into the muscle every 7 (seven) days.  . traZODone (DESYREL) 50 MG tablet Take 25 mg by mouth at bedtime as needed for sleep.   . [DISCONTINUED] metoprolol succinate (TOPROL-XL) 50 MG 24 hr tablet Take 1 tablet (50 mg total) by mouth in the morning and at bedtime.     Allergies:   Pravastatin and Celexa [citalopram]   Social History   Tobacco Use  . Smoking status: Never Smoker  . Smokeless tobacco: Never Used  Vaping Use  . Vaping Use: Never used  Substance Use Topics  . Alcohol use: No  . Drug use: No     Family Hx: The patient's family history is negative for Bladder  Cancer, Kidney cancer, and Prostate cancer.  ROS:   Please see the history of present illness.   Labs/Other Tests and Data Reviewed:    EKG:  No ECG reviewed.  Recent Labs: 02/12/2020: ALT 26   Recent Lipid Panel Lab Results  Component Value Date/Time   CHOL 132 02/12/2020 08:30 AM   TRIG 59 02/12/2020 08:30 AM   HDL 47 02/12/2020 08:30 AM   CHOLHDL 2.8 02/12/2020 08:30 AM   LDLCALC 73 02/12/2020 08:30 AM   Labs from care everywhere personally reviewed and interpreted 12/21/2019: Hemoglobin 13.5, TSH 1.316 06/17/2020: Total cholesterol 113, triglycerides 77, HDL 45.6, LDL 52, K+ 5, ALT 20, creatinine 1.0  Wt Readings from Last 3 Encounters:  09/12/20 165 lb (74.8 kg)  06/21/20 173 lb (78.5 kg)  04/19/20 178 lb (80.7 kg)     Risk Assessment/Calculations:      Objective:    Vital Signs:  BP 138/72   Pulse 99   Ht 5\' 6"  (1.676 m)   Wt 165 lb (74.8 kg)   BMI 26.63 kg/m    VITAL SIGNS:  reviewed GEN:  no acute distress PSYCH:  normal affect  ASSESSMENT & PLAN:    1. Sinus tachycardia He really does not have any other underlying causes that would contribute to his sinus tachycardia.   He had a fairly normal hemoglobin in March as well as a normal TSH.  He had no other arrhythmias noted on his monitor.  His ejection fraction has been normal.  He seems to be better on metoprolol.  However, he does note fatigue.  I have asked him to try increasing his metoprolol succinate to 100 mg in the morning and 50 mg in the evening.  If he cannot tolerate this, we can consider changing him over to Ivabridine to see if this is better tolerated and improves his heart rate.  Follow-up with Dr. Burt Knack or me in 6 months.  2. Coronary artery disease involving native coronary artery of native heart with angina pectoris ([place]) Nonobstructive coronary artery disease by coronary CTA in June 2020.  He is not having any anginal symptoms.  Continue aspirin, atorvastatin.  3. Essential hypertension The patient's blood pressure is controlled on his current regimen.  Continue current therapy.   4. Mixed hyperlipidemia LDL optimal on most recent lab work.  Continue current Rx.      Time:   Today, I have spent 11 minutes with the patient with telehealth technology discussing the above problems.     Medication Adjustments/Labs and Tests Ordered: Current medicines are reviewed at length with the patient today.  Concerns regarding medicines are outlined above.   Tests Ordered: No orders of the defined types were placed in this encounter.   Medication Changes: Meds ordered this encounter  Medications  . metoprolol succinate (TOPROL-XL) 100 MG 24 hr tablet    Sig: Take 1 tablet by mouth in the morning and take 0.5 tablet by mouth in the evening    Dispense:  135 tablet    Refill:  3    Follow Up:  In Person in 6 month(s)  Signed, Richardson Dopp, PA-C  09/12/2020 3:09 PM    [facility]

## 2020-09-12 ENCOUNTER — Telehealth: Payer: 59 | Admitting: Physician Assistant

## 2020-09-12 ENCOUNTER — Encounter: Payer: Self-pay | Admitting: Physician Assistant

## 2020-09-12 ENCOUNTER — Other Ambulatory Visit: Payer: Self-pay

## 2020-09-12 ENCOUNTER — Telehealth (INDEPENDENT_AMBULATORY_CARE_PROVIDER_SITE_OTHER): Payer: Medicare Other | Admitting: Physician Assistant

## 2020-09-12 VITALS — BP 138/72 | HR 99 | Ht 66.0 in | Wt 165.0 lb

## 2020-09-12 DIAGNOSIS — I25119 Atherosclerotic heart disease of native coronary artery with unspecified angina pectoris: Secondary | ICD-10-CM | POA: Diagnosis not present

## 2020-09-12 DIAGNOSIS — I10 Essential (primary) hypertension: Secondary | ICD-10-CM

## 2020-09-12 DIAGNOSIS — E782 Mixed hyperlipidemia: Secondary | ICD-10-CM | POA: Diagnosis not present

## 2020-09-12 DIAGNOSIS — R000 Tachycardia, unspecified: Secondary | ICD-10-CM

## 2020-09-12 MED ORDER — METOPROLOL SUCCINATE ER 100 MG PO TB24: ORAL_TABLET | ORAL | 3 refills | Status: DC

## 2020-09-12 NOTE — Patient Instructions (Signed)
Medication Instructions:  Your physician has recommended you make the following change in your medication:   1) Change Metoprolol to 100 mg, 1 tablet by mouth in the morning and 0.5 tablet by mouth in the evening   *If you need a refill on your cardiac medications before your next appointment, please call your pharmacy*  Lab Work: None ordered today  Testing/Procedures: None ordered today  Follow-Up: At [facility], you and your health needs are our priority.  As part of our continuing mission to provide you with exceptional heart care, we have created designated Provider Care Teams.  These Care Teams include your primary Cardiologist (physician) and Advanced Practice Providers (APPs -  Physician Assistants and Nurse Practitioners) who all work together to provide you with the care you need, when you need it.  Your next appointment:   6 month(s)  The format for your next appointment:   In Person  Provider:   You may see Sherren Mocha, MD or Richardson Dopp, PA-C  Other Instructions If you cannot tolerate the Metoprolol increase, call the office and we can change prescription to 50 mg tablets and you can take 75 mg twice daily

## 2020-09-13 ENCOUNTER — Other Ambulatory Visit: Payer: Self-pay | Admitting: *Deleted

## 2020-09-13 DIAGNOSIS — N200 Calculus of kidney: Secondary | ICD-10-CM

## 2020-09-13 NOTE — Telephone Encounter (Signed)
appt scheduled with Dr. Junious Silk

## 2020-09-14 ENCOUNTER — Encounter: Payer: Self-pay | Admitting: Urology

## 2020-09-14 ENCOUNTER — Ambulatory Visit
Admission: RE | Admit: 2020-09-14 | Discharge: 2020-09-14 | Disposition: A | Discharge status: Home / Self Care | Payer: Medicare Other | Attending: Urology | Admitting: Urology

## 2020-09-14 ENCOUNTER — Ambulatory Visit
Admission: RE | Admit: 2020-09-14 | Discharge: 2020-09-14 | Disposition: A | Discharge status: Home / Self Care | Payer: Medicare Other | Source: Ambulatory Visit | Attending: Urology | Admitting: Urology

## 2020-09-14 ENCOUNTER — Ambulatory Visit (INDEPENDENT_AMBULATORY_CARE_PROVIDER_SITE_OTHER): Payer: Medicare Other | Admitting: Urology

## 2020-09-14 ENCOUNTER — Other Ambulatory Visit: Payer: Self-pay

## 2020-09-14 VITALS — BP 106/74 | HR 120 | Ht 66.0 in | Wt 165.0 lb

## 2020-09-14 DIAGNOSIS — N200 Calculus of kidney: Secondary | ICD-10-CM

## 2020-09-14 DIAGNOSIS — I25119 Atherosclerotic heart disease of native coronary artery with unspecified angina pectoris: Secondary | ICD-10-CM | POA: Diagnosis not present

## 2020-09-14 NOTE — Progress Notes (Signed)
09/14/2020 2:51 PM   [name] Slipper Berkley Jr. 08-27-1971 [id number]  Referring provider: Sofie Hartigan, MD [place],  [place] [address]  Chief Complaint  Patient presents with  . Other    HPI:  Jonavan sees Dr. Diamantina Providence for kidney stones.  He has had about a week of left-sided pain and left lower quadrant pain which radiates down into left inguinal region.  He underwent KUB today and although the official read is pending I do not see any stones in the vicinity of the kidney, ureters or the bladder.  There are 3 pelvic opacities consistent with prior phleboliths or prostate calcification.  Comparison made back to KUB from June 2021 and CT from 2018. He has had fatigue.   His UA is clear.   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  His 24-hour urine earlier this year showed low volume and mildly elevated urine sodium.  He was instructed to increase water and decrease his sodium intake. Continue citrate intake.    PMH: Past Medical History:  Diagnosis Date  . CAD (coronary artery disease)   . Chronic pain   . Diabetes mellitus without complication ([place])   . DJD (degenerative joint disease)   . GERD (gastroesophageal reflux disease)   . Headache   . Hyperlipidemia   . Hypertension   . Hypogonadism male   . Kidney stones     Surgical History: Past Surgical History:  Procedure Laterality Date  . BICEPT TENODESIS Left 12/30/2019   Procedure: MINI BICEPS TENODESIS;  Surgeon: Corky Mull, MD;  Location: [facility] ORS;  Service: Orthopedics;  Laterality: Left;  . COLONOSCOPY WITH PROPOFOL N/A 01/05/2019   Procedure: COLONOSCOPY WITH PROPOFOL;  Surgeon: Lollie Sails, MD;  Location: [facility] ENDOSCOPY;  Service: Endoscopy;  Laterality: N/A;  . ESOPHAGOGASTRODUODENOSCOPY (EGD) WITH PROPOFOL N/A 01/05/2019   Procedure: ESOPHAGOGASTRODUODENOSCOPY (EGD) WITH PROPOFOL;  Surgeon: Lollie Sails, MD;  Location: [facility] ENDOSCOPY;  Service:  Endoscopy;  Laterality: N/A;  . EXTRACORPOREAL SHOCK WAVE LITHOTRIPSY Right 05/14/2019   Procedure: EXTRACORPOREAL SHOCK WAVE LITHOTRIPSY (ESWL);  Surgeon: [name], MD;  Location: [facility] ORS;  Service: Urology;  Laterality: Right;  . KNEE ARTHROSCOPY    . KNEE ARTHROSCOPY WITH MENISCAL REPAIR Right 01/19/2016   Procedure: KNEE ARTHROSCOPY WITH MENISCAL REPAIR;  Surgeon: Corky Mull, MD;  Location: [facility] ORS;  Service: Orthopedics;  Laterality: Right;  . KNEE ARTHROSCOPY WITH MENISCAL REPAIR Right 06/12/2016   Procedure: KNEE ARTHROSCOPY WITH PARTIAL MEDIAL MENISCAL REPAIR;  Surgeon: Corky Mull, MD;  Location: [facility] ORS;  Service: Orthopedics;  Laterality: Right;  . PARATHYROIDECTOMY    . SHOULDER ARTHROSCOPY WITH ROTATOR CUFF REPAIR Left 12/30/2019   Procedure: SHOULDER ARTHROSCOPY WITH ROTATOR CUFF REPAIR;  Surgeon: Corky Mull, MD;  Location: [facility] ORS;  Service: Orthopedics;  Laterality: Left;  . SHOULDER SURGERY Bilateral   . SPINAL CORD STIMULATOR BATTERY EXCHANGE    . SPINAL CORD STIMULATOR IMPLANT  2008  . SPINAL FUSION     L4-L5 (2004), L5-S1 (2006)  . THYROIDECTOMY, PARTIAL    . TONSILLECTOMY      Home Medications:  Allergies as of 09/14/2020      Reactions   Pravastatin Other (See Comments)   ELEVATED LFT'S   Celexa [citalopram] Rash      Medication List       Accurate as of September 14, 2020  2:51 PM. If you have any questions, ask your  nurse or doctor.        acyclovir 200 MG capsule Commonly known as: ZOVIRAX Take 200 mg by mouth 2 (two) times daily as needed (fever blisters).   aspirin EC 81 MG tablet Take 1 tablet (81 mg total) by mouth daily.   atorvastatin 40 MG tablet Commonly known as: LIPITOR Take 1 tablet (40 mg total) by mouth daily.   cholecalciferol 1000 units tablet Commonly known as: VITAMIN D Take 1,000 Units by mouth daily.   EPINEPHrine 0.3 mg/0.3 mL Soaj injection Commonly known as: EPI-PEN Inject 0.3 mg into the muscle as needed  for anaphylaxis.   hydrOXYzine 10 MG tablet Commonly known as: ATARAX/VISTARIL Take 10 mg by mouth 3 (three) times daily as needed for itching.   metFORMIN 500 MG 24 hr tablet Commonly known as: GLUCOPHAGE-XR Take 500 mg by mouth 2 (two) times daily.   metoprolol succinate 100 MG 24 hr tablet Commonly known as: TOPROL-XL Take 1 tablet by mouth in the morning and take 0.5 tablet by mouth in the evening   nortriptyline 50 MG capsule Commonly known as: PAMELOR Take 50 mg by mouth at bedtime.   OxyCONTIN 10 mg 12 hr tablet Generic drug: oxyCODONE Take 10 mg by mouth in the morning and at bedtime.   pantoprazole 40 MG tablet Commonly known as: PROTONIX Take 40 mg by mouth daily.   SUMAtriptan 100 MG tablet Commonly known as: IMITREX Take 100 mg by mouth every 2 (two) hours as needed for migraine.   tamsulosin 0.4 MG Caps capsule Commonly known as: FLOMAX Take 1 capsule (0.4 mg total) by mouth daily.   testosterone cypionate 200 MG/ML injection Commonly known as: DEPOTESTOSTERONE CYPIONATE Inject 200 mg into the muscle every 7 (seven) days.   traZODone 50 MG tablet Commonly known as: DESYREL Take 25 mg by mouth at bedtime as needed for sleep.       Allergies:  Allergies  Allergen Reactions  . Pravastatin Other (See Comments)    ELEVATED LFT'S  . Celexa [Citalopram] Rash    Family History: Family History  Problem Relation Age of Onset  . Bladder Cancer Neg Hx   . Kidney cancer Neg Hx   . Prostate cancer Neg Hx     Social History:  reports that he has never smoked. He has never used smokeless tobacco. He reports that he does not drink alcohol and does not use drugs.   Physical Exam: BP 106/74   Pulse (!) 120   Ht 5\' 6"  (1.676 m)   Wt 165 lb (74.8 kg)   BMI 26.63 kg/m   Constitutional:  Alert and oriented, No acute distress. HEENT: [place] AT, moist mucus membranes.  Trachea midline, no masses. Cardiovascular: No clubbing, cyanosis, or edema. Respiratory:  Normal respiratory effort, no increased work of breathing. GI: Abdomen is soft, nontender, nondistended, no abdominal masses GU: No CVA tenderness Lymph: No cervical or inguinal lymphadenopathy. Skin: No rashes, bruises or suspicious lesions. Neurologic: Grossly intact, no focal deficits, moving all 4 extremities. Psychiatric: Normal mood and affect. GU: Penis circumcised, normal foreskin, testicles descended bilaterally and palpably normal, bilateral epididymis palpably normal, scrotum normal; No inguinal hernia   Laboratory Data: Lab Results  Component Value Date   WBC 7.2 12/16/2018   HGB 17.2 (H) 12/16/2018   HCT 50.5 12/16/2018   MCV 92.8 12/16/2018   PLT 249 12/16/2018    Lab Results  Component Value Date   CREATININE 1.00 04/21/2019    No results found for: PSA  No results found for: TESTOSTERONE  No results found for: HGBA1C  Urinalysis    Component Value Date/Time   COLORURINE YELLOW 04/14/2020 [place] 04/14/2020 1313   APPEARANCEUR Clear 10/09/2019 0909   LABSPEC 1.010 04/14/2020 1313   LABSPEC 1.010 05/25/2013 1537   PHURINE 6.0 04/14/2020 1313   GLUCOSEU 500 (A) 04/14/2020 1313   GLUCOSEU 100 mg/dL 05/25/2013 1537   HGBUR NEGATIVE 04/14/2020 1313   BILIRUBINUR NEGATIVE 04/14/2020 1313   BILIRUBINUR Negative 10/09/2019 0909   BILIRUBINUR NEGATIVE 05/25/2013 1537   KETONESUR NEGATIVE 04/14/2020 1313   PROTEINUR NEGATIVE 04/14/2020 1313   NITRITE NEGATIVE 04/14/2020 1313   LEUKOCYTESUR NEGATIVE 04/14/2020 1313   LEUKOCYTESUR NEGATIVE 05/25/2013 1537    Lab Results  Component Value Date   LABMICR See below: 10/09/2019   WBCUA 0-5 10/09/2019   RBCUA 3-10 (A) 01/06/2019   LABEPIT 0-10 10/09/2019   MUCUS Present (A) 01/06/2019   BACTERIA RARE (A) 04/14/2020    Pertinent Imaging: KUB, CT - 2018, 2021  Results for orders placed during the hospital encounter of 04/14/20  Abdomen 1 view (KUB)  Narrative CLINICAL DATA:  Bilateral  flank pain, fatigue he is  EXAM: ABDOMEN - 1 VIEW  COMPARISON:  10/09/2019  FINDINGS: The 2 supine frontal views of the abdomen and pelvis are obtained. Stable vascular calcifications within the pelvis. No evidence of urinary tract calculi on this exam. Bowel gas and stool obscure portions of the renal silhouettes. No obstruction or ileus. Spinal stimulator unchanged.  IMPRESSION: 1. No evidence of urinary tract calculi.   Electronically Signed By: Randa Ngo M.D. On: 04/15/2020 20:30  No results found for this or any previous visit.  No results found for this or any previous visit.  No results found for this or any previous visit.  Results for orders placed during the hospital encounter of 06/20/18  US RENAL  Narrative CLINICAL DATA:  Flank pain, LEFT flank pain for 6 months, history kidney stones  EXAM: RENAL / URINARY TRACT ULTRASOUND COMPLETE  COMPARISON:  CT abdomen pelvis 12/26/2016  FINDINGS: Right Kidney:  Length: 11.0 cm. Normal cortical thickness. Heterogeneously mildly increased cortical echogenicity. No mass or hydronephrosis. Shadowing echogenic focus at lower pole question nonobstructing calculus.  Left Kidney:  Length: 11.3 cm. Cortical thickness normal. Increased cortical echogenicity. Nonshadowing echogenic focus at upper to mid LEFT kidney could represent a small nonobstructing calculus 6 mm diameter. No mass or hydronephrosis.  Bladder:  Appears normal for degree of bladder distention.  IMPRESSION: Question BILATERAL nonobstructing renal calculi.  Medical renal disease changes of both kidneys.   Electronically Signed By: Lavonia Dana M.D. On: 06/20/2018 16:13  No results found for this or any previous visit.  No results found for this or any previous visit.  Results for orders placed during the hospital encounter of 12/26/16  CT Renal Stone Study  Narrative CLINICAL DATA:  New onset left lower pelvic pain beginning  Monday. Burning with urination. Nausea vomiting. Personal history of kidney stents with lithotripsy.  EXAM: CT ABDOMEN AND PELVIS WITHOUT CONTRAST  TECHNIQUE: Multidetector CT imaging of the abdomen and pelvis was performed following the standard protocol without IV contrast.  COMPARISON:  None.  FINDINGS: Lower chest: All lung bases are clear without focal nodule, mass, or airspace disease.  Hepatobiliary: No focal liver abnormality is seen. No gallstones, gallbladder wall thickening, or biliary dilatation.  Pancreas: Unremarkable. No pancreatic ductal dilatation or surrounding inflammatory changes.  Spleen: Normal in size without focal  abnormality.  Adrenals/Urinary Tract: Adrenal glands are normal bilaterally. Two punctate nonobstructing stones are present in the right kidney. There are at least 3 punctate nonobstructing stones in the left kidney. There is slight stranding about the left ureter. An obstructing 4.0 stone is present at the left UVJ. The right ureter is unremarkable. The urinary bladder is otherwise within normal limits.  Stomach/Bowel: The stomach and duodenum are within normal limits. The small bowel is unremarkable. Appendix is visualized and normal. The ascending and transverse colon are within normal limits. The descending and sigmoid colon are otherwise unremarkable.  Vascular/Lymphatic: Mild vascular calcifications are present in the aorta and branch vessels without aneurysm  Reproductive: Prostate is unremarkable.  Other: No significant free fluid or free air is present.  Musculoskeletal: Lumbar fusion is noted at L4-5 and L5-S1. Adjacent level disease is present at L3-4. No focal lytic or blastic lesions are present.  IMPRESSION: 1. Partially obstructing 4 mm stone at the left UVJ without significant hydronephrosis. 2. Additional punctate nonobstructing stones in both kidneys. 3. Atherosclerosis. 4. Lumbar fusion.   Electronically  Signed By: San Morelle M.D. On: 12/26/2016 20:07   Assessment & Plan:    1. Nephrolithiasis No evidence of recurrent stone disease on KUB or UA. Normal exam with no hernia. He will keep Aug 2022 with Dr. Diamantina Providence for KUB.   - Urinalysis, Complete   No follow-ups on file.  Festus Aloe, MD  [facility] [address] [place], [place] [address] [phone]

## 2020-09-20 LAB — URINALYSIS, COMPLETE
Bilirubin, UA: NEGATIVE
Ketones, UA: NEGATIVE
Leukocytes,UA: NEGATIVE
Nitrite, UA: NEGATIVE
Protein,UA: NEGATIVE
RBC, UA: NEGATIVE
Specific Gravity, UA: <1.005 — ABNORMAL LOW (ref 1.005–1.030)
Urobilinogen, Ur: 0.2 mg/dL (ref 0.2–1.0)
pH, UA: 6 (ref 5.0–7.5)

## 2020-09-20 LAB — MICROSCOPIC EXAMINATION
Bacteria, UA: NONE SEEN
Epithelial Cells (non renal): NONE SEEN /hpf (ref 0–10)
RBC, Urine: NONE SEEN /hpf (ref 0–2)

## 2020-10-19 ENCOUNTER — Other Ambulatory Visit: Payer: Self-pay

## 2020-10-19 DIAGNOSIS — J301 Allergic rhinitis due to pollen: Secondary | ICD-10-CM | POA: Diagnosis not present

## 2020-10-19 MED ORDER — ATORVASTATIN CALCIUM 40 MG PO TABS: 40.0000 mg | ORAL_TABLET | Freq: Every day | ORAL | 3 refills | Status: DC

## 2020-10-25 DIAGNOSIS — J301 Allergic rhinitis due to pollen: Secondary | ICD-10-CM | POA: Diagnosis not present

## 2020-11-14 DIAGNOSIS — G43719 Chronic migraine without aura, intractable, without status migrainosus: Secondary | ICD-10-CM | POA: Diagnosis not present

## 2020-11-28 DIAGNOSIS — M25559 Pain in unspecified hip: Secondary | ICD-10-CM | POA: Diagnosis not present

## 2020-11-28 DIAGNOSIS — Z79899 Other long term (current) drug therapy: Secondary | ICD-10-CM | POA: Diagnosis not present

## 2020-11-28 DIAGNOSIS — G894 Chronic pain syndrome: Secondary | ICD-10-CM | POA: Diagnosis not present

## 2020-11-28 DIAGNOSIS — M47814 Spondylosis without myelopathy or radiculopathy, thoracic region: Secondary | ICD-10-CM | POA: Diagnosis not present

## 2020-11-28 DIAGNOSIS — M5412 Radiculopathy, cervical region: Secondary | ICD-10-CM | POA: Diagnosis not present

## 2020-11-28 DIAGNOSIS — M791 Myalgia, unspecified site: Secondary | ICD-10-CM | POA: Diagnosis not present

## 2020-12-05 NOTE — Telephone Encounter (Signed)
Agree with discussing dry mouth symptom with PCP.  For shortness of breath, he should be seen back in the office to evaluate.  Please schedule with Dr. Burt Knack, me or APP on our team.  Richardson Dopp, PA-C    12/05/2020 11:42 AM

## 2020-12-06 NOTE — Progress Notes (Signed)
Cardiology Office Note   Date:  12/07/2020   ID:  [name] Havard [facility] Jr., DOB 07/12/1971, MRN [id number]  PCP:  [name] Hartigan, MD  Cardiologist: Dr. Sherren Mocha, MD  Chief Complaint  Patient presents with  . Follow-up     History of Present Illness: [name] Siegmann Tenpenny Jr. is a 50 y.o. male who presents for evaluation of shortness of breath, seen for Dr. Burt Brown.  [name] has a history of non-obstructive CAD per coronary CTA performed 06/2019 with a coronary calcium score of 54 which is 97th percentile for age and sex matched control, DM2, HTN, HLD, family history of CAD, history of tachycardia per ZIO monitor 03/2020 with a HR at 105 bpm.  Echocardiogram from 09/09/2018 with LVEF at greater than 55% with mild MR and trace TR.  Myoview stress testing from 09/03/2018 with normal perfusion and no evidence of ischemia or infarct.  He was most recently seen by [name] Dopp, PA on 09/12/2020 in follow-up.  At that time, he had recently worn an event monitor which showed an average heart rate at 105 bpm. His beta-blocker was increased with improvement.  Toprol dosing at that time was 100 mg in the morning and 50 mg in the evening plan if unable to tolerate was to transition to ivabradine.   He presents today for follow-up and reports that his heart rates have somewhat improved.  EKG performed today shows that HR at 87 bpm.  He will occasionally have spikes into the low 100s while sitting and resting however does feel like overall there is improvement.  He reports he had some dizziness with splitting the Toprol to 100 mg in the a.m. and 50 mg in the p.m.  As [name] Brown suggested, we discussed splitting the Toprol to 70 5 in the AM and 70 5 in the PM.  He wishes to try this prior to attempting another medication.  He also reports some mild DOE and fatigue.  He has nonobstructive CAD per coronary CTA performed in 2020.  He denies anginal symptoms.  Last echocardiogram appears to be in 2019 with  a stable LVEF.  Given the symptoms and tachycardia we will plan for an echocardiogram prior to next visit.  If any LV changes or abnormal wall motion, plan for further ischemic evaluation.  He denies LE edema, orthopnea, PND, presyncopal or syncopal episodes.  Past Medical History:  Diagnosis Date  . CAD (coronary artery disease)   . Chronic pain   . Diabetes mellitus without complication ([place])   . DJD (degenerative joint disease)   . GERD (gastroesophageal reflux disease)   . Headache   . Hyperlipidemia   . Hypertension   . Hypogonadism male   . Kidney stones     Past Surgical History:  Procedure Laterality Date  . BICEPT TENODESIS Left 12/30/2019   Procedure: MINI BICEPS TENODESIS;  Surgeon: Corky Mull, MD;  Location: [facility] ORS;  Service: Orthopedics;  Laterality: Left;  . COLONOSCOPY WITH PROPOFOL N/A 01/05/2019   Procedure: COLONOSCOPY WITH PROPOFOL;  Surgeon: Lollie Sails, MD;  Location: [facility] ENDOSCOPY;  Service: Endoscopy;  Laterality: N/A;  . ESOPHAGOGASTRODUODENOSCOPY (EGD) WITH PROPOFOL N/A 01/05/2019   Procedure: ESOPHAGOGASTRODUODENOSCOPY (EGD) WITH PROPOFOL;  Surgeon: Lollie Sails, MD;  Location: [facility] ENDOSCOPY;  Service: Endoscopy;  Laterality: N/A;  . EXTRACORPOREAL SHOCK WAVE LITHOTRIPSY Right 05/14/2019   Procedure: EXTRACORPOREAL SHOCK WAVE LITHOTRIPSY (ESWL);  Surgeon: [name], MD;  Location: [facility] ORS;  Service: Urology;  Laterality:  Right;  Marland Kitchen KNEE ARTHROSCOPY    . KNEE ARTHROSCOPY WITH MENISCAL REPAIR Right 01/19/2016   Procedure: KNEE ARTHROSCOPY WITH MENISCAL REPAIR;  Surgeon: Corky Mull, MD;  Location: [facility] ORS;  Service: Orthopedics;  Laterality: Right;  . KNEE ARTHROSCOPY WITH MENISCAL REPAIR Right 06/12/2016   Procedure: KNEE ARTHROSCOPY WITH PARTIAL MEDIAL MENISCAL REPAIR;  Surgeon: Corky Mull, MD;  Location: [facility] ORS;  Service: Orthopedics;  Laterality: Right;  . PARATHYROIDECTOMY    . SHOULDER ARTHROSCOPY WITH ROTATOR CUFF REPAIR Left  12/30/2019   Procedure: SHOULDER ARTHROSCOPY WITH ROTATOR CUFF REPAIR;  Surgeon: Corky Mull, MD;  Location: [facility] ORS;  Service: Orthopedics;  Laterality: Left;  . SHOULDER SURGERY Bilateral   . SPINAL CORD STIMULATOR BATTERY EXCHANGE    . SPINAL CORD STIMULATOR IMPLANT  2008  . SPINAL FUSION     L4-L5 (2004), L5-S1 (2006)  . THYROIDECTOMY, PARTIAL    . TONSILLECTOMY       Current Outpatient Medications  Medication Sig Dispense Refill  . acyclovir (ZOVIRAX) 200 MG capsule Take 200 mg by mouth 2 (two) times daily as needed (fever blisters).     Marland Kitchen aspirin EC 81 MG tablet Take 1 tablet (81 mg total) by mouth daily. 90 tablet 3  . atorvastatin (LIPITOR) 40 MG tablet Take 1 tablet (40 mg total) by mouth daily. 90 tablet 3  . cholecalciferol (VITAMIN D) 1000 units tablet Take 1,000 Units by mouth daily.    Marland Kitchen EPINEPHrine 0.3 mg/0.3 mL IJ SOAJ injection Inject 0.3 mg into the muscle as needed for anaphylaxis.    . hydrOXYzine (ATARAX/VISTARIL) 10 MG tablet Take 10 mg by mouth 3 (three) times daily as needed for itching.     . metFORMIN (GLUCOPHAGE-XR) 500 MG 24 hr tablet Take 500 mg by mouth 2 (two) times daily.    . metoprolol succinate (TOPROL-XL) 100 MG 24 hr tablet Take 1 tablet by mouth in the morning and take 0.5 tablet by mouth in the evening 135 tablet 3  . nortriptyline (PAMELOR) 50 MG capsule Take 50 mg by mouth at bedtime.     . OXYCONTIN 10 MG 12 hr tablet Take 10 mg by mouth in the morning and at bedtime.    . pantoprazole (PROTONIX) 40 MG tablet Take 40 mg by mouth daily.    . SUMAtriptan (IMITREX) 100 MG tablet Take 100 mg by mouth every 2 (two) hours as needed for migraine.     . tamsulosin (FLOMAX) 0.4 MG CAPS capsule Take 1 capsule (0.4 mg total) by mouth daily. 30 capsule 3  . testosterone cypionate (DEPOTESTOSTERONE CYPIONATE) 200 MG/ML injection Inject 200 mg into the muscle every 7 (seven) days.    . traZODone (DESYREL) 50 MG tablet Take 25 mg by mouth at bedtime as needed  for sleep.      No current facility-administered medications for this visit.    Allergies:   Pravastatin and Celexa [citalopram]    Social History:  The patient  reports that he has never smoked. He has never used smokeless tobacco. He reports that he does not drink alcohol and does not use drugs.   Family History:  The patient's family history is not on file.    ROS:  Please see the history of present illness. Otherwise, review of systems are positive for none.   All other systems are reviewed and negative.    PHYSICAL EXAM: VS:  BP 122/72   Pulse 87   Ht 5\' 6"  (1.676 m)  Wt 173 lb (78.5 kg)   SpO2 98%   BMI 27.92 kg/m  , BMI Body mass index is 27.92 kg/m.   General: Well developed, well nourished, NAD Lungs:Clear to ausculation bilaterally. No wheezes, rales, or rhonchi. Breathing is unlabored. Cardiovascular: RRR with S1 S2. No murmurs Extremities: No edema. No clubbing or cyanosis.  Radial pulses 2+ bilaterally Neuro: Alert and oriented. No focal deficits. No facial asymmetry. MAE spontaneously. Psych: Responds to questions appropriately with normal affect.     EKG:  EKG is ordered today. The ekg ordered today demonstrates NSR with HR 87 bpm and no acute changes.   Recent Labs: 02/12/2020: ALT 26    Lipid Panel    Component Value Date/Time   CHOL 132 02/12/2020 0830   TRIG 59 02/12/2020 0830   HDL 47 02/12/2020 0830   CHOLHDL 2.8 02/12/2020 0830   VLDL 12 02/12/2020 0830   LDLCALC 73 02/12/2020 0830    Wt Readings from Last 3 Encounters:  12/07/20 173 lb (78.5 kg)  09/14/20 165 lb (74.8 kg)  09/12/20 165 lb (74.8 kg)    Other studies Reviewed: Additional studies/ records that were reviewed today include:   Review of the above records demonstrates:  Zio Monitor 04/11/20 1. The basic rhythm is normal sinus with an average HR of 105 bpm 2. No atrial fibrillation or flutter 3. No high-grade heart block or pathologic pauses 4. There are rare PVC's and  rare supraventricular beats without sustained arrhythmias  Coronary CTA 04/21/2019 LAD prox 25-49; D2 prox 1-24 LCx prox and mid 25-49; OM1 prox 25-49 RCA prox and mid 1-24 Ca2+ score=257 (97th percentile) Non-obstructive CAD Aortic root 3.1 cm   Myoview 09/03/18 ([facility]) EF 58, normal perfusion  Echocardiogram 09/09/18 ([facility]) EF > 55, normal RVSF, mild MR, trace TR  ASSESSMENT AND PLAN:  1. Sinus tachycardia: -Previously seen by [name] Dopp, PA for tachycardia at which time patient felt intermittent palpitations with heart rate sustaining in the low 100 range.  He was trialed on Toprol 150 mg a split with 100 mg in the a.m. and 50 mg in the p.m.  Patient reports he had some dizziness with this, appears to be mostly orthostatic therefore he wishes to trial dosing at 80 in the AM and 75 in the PM for now rather than switching medications completely.  -Patient does report some improvement in heart rates with EKG today showing heart rate at 87 bpm -We will plan on changes as above with close follow-up.    2.  DOE: -Patient reports a several month history of mild DOE.  Physical activities which used to be easy for him have now caused some dyspnea.  He has a history of nonobstructive CAD per coronary CTA in 2020.  He denies chest pain and has no heart failure symptomology.  Given his tachycardia and recent changes with DOE will plan to pursue an echocardiogram.  If wall motion abnormalities or LV changes, plan for ischemic work-up.  2. Nonobstructive CAD: -Per CTA 03/2019 with 25-49% LAD, 25-49% LCx, 25-49% OM1 -Denies anginal symptoms -Continue aspirin, atorvastatin.  3. Essential hypertension -Stable, 122/72 -Continue Toprol XL split 75 am/75 pm  4. Hyperlipidemia: -Last LDL, 73 on 02/12/2020 -Continue current regimen    Current medicines are reviewed at length with the patient today.  The patient does not have concerns regarding medicines.  The following  changes have been made:  no change  Labs/ tests ordered today include: CBC, BMET, echocardiogram  Orders Placed This Encounter  Procedures  . Basic metabolic panel  . CBC  . EKG 12-Lead  . ECHOCARDIOGRAM COMPLETE    Disposition:   FU with myself or [name] Dopp PA in 3 weeks   Signed, Kathyrn Drown, NP  12/07/2020 9:57 AM    [facility] [place], [place], [place]  [address] Phone: [phone]; Fax: [phone]

## 2020-12-07 ENCOUNTER — Other Ambulatory Visit: Payer: Self-pay

## 2020-12-07 ENCOUNTER — Ambulatory Visit (INDEPENDENT_AMBULATORY_CARE_PROVIDER_SITE_OTHER): Payer: Medicare Other | Admitting: Cardiology

## 2020-12-07 ENCOUNTER — Encounter: Payer: Self-pay | Admitting: Cardiology

## 2020-12-07 VITALS — BP 122/72 | HR 87 | Ht 66.0 in | Wt 173.0 lb

## 2020-12-07 DIAGNOSIS — R5383 Other fatigue: Secondary | ICD-10-CM

## 2020-12-07 DIAGNOSIS — I10 Essential (primary) hypertension: Secondary | ICD-10-CM | POA: Diagnosis not present

## 2020-12-07 DIAGNOSIS — R000 Tachycardia, unspecified: Secondary | ICD-10-CM

## 2020-12-07 DIAGNOSIS — E782 Mixed hyperlipidemia: Secondary | ICD-10-CM | POA: Diagnosis not present

## 2020-12-07 DIAGNOSIS — R0602 Shortness of breath: Secondary | ICD-10-CM

## 2020-12-07 DIAGNOSIS — I2510 Atherosclerotic heart disease of native coronary artery without angina pectoris: Secondary | ICD-10-CM | POA: Diagnosis not present

## 2020-12-07 LAB — BASIC METABOLIC PANEL
BUN/Creatinine Ratio: 11 (ref 9–20)
BUN: 12 mg/dL (ref 6–24)
CO2: 22 mmol/L (ref 20–29)
Calcium: 9.5 mg/dL (ref 8.7–10.2)
Chloride: 97 mmol/L (ref 96–106)
Creatinine, Ser: 1.06 mg/dL (ref 0.76–1.27)
GFR calc Af Amer: 94 mL/min/{1.73_m2} (ref >59)
GFR calc non Af Amer: 81 mL/min/{1.73_m2} (ref >59)
Glucose: 216 mg/dL — ABNORMAL HIGH (ref 65–99)
Potassium: 4.9 mmol/L (ref 3.5–5.2)
Sodium: 136 mmol/L (ref 134–144)

## 2020-12-07 LAB — CBC
Hematocrit: 32.7 % — ABNORMAL LOW (ref 37.5–51.0)
Hemoglobin: 9.4 g/dL — ABNORMAL LOW (ref 13.0–17.7)
MCH: 19.5 pg — ABNORMAL LOW (ref 26.6–33.0)
MCHC: 28.7 g/dL — ABNORMAL LOW (ref 31.5–35.7)
MCV: 68 fL — ABNORMAL LOW (ref 79–97)
Platelets: 481 10*3/uL — ABNORMAL HIGH (ref 150–450)
RBC: 4.83 x10E6/uL (ref 4.14–5.80)
RDW: 18.8 % — ABNORMAL HIGH (ref 11.6–15.4)
WBC: 6.5 10*3/uL (ref 3.4–10.8)

## 2020-12-07 NOTE — Patient Instructions (Signed)
Medication Instructions:  Your physician recommends that you continue on your current medications as directed. Please refer to the Current Medication list given to you today.  *If you need a refill on your cardiac medications before your next appointment, please call your pharmacy*   Lab Work: TODAY: BMET, CBC If you have labs (blood work) drawn today and your tests are completely normal, you will receive your results only by: Marland Kitchen MyChart Message (if you have MyChart) OR . A paper copy in the mail If you have any lab test that is abnormal or we need to change your treatment, we will call you to review the results.   Testing/Procedures: Your physician has requested that you have an echocardiogram. Echocardiography is a painless test that uses sound waves to create images of your heart. It provides your doctor with information about the size and shape of your heart and how well your heart's chambers and valves are working. This procedure takes approximately one hour. There are no restrictions for this procedure.  Follow-Up: At [facility], you and your health needs are our priority.  As part of our continuing mission to provide you with exceptional heart care, we have created designated Provider Care Teams.  These Care Teams include your primary Cardiologist (physician) and Advanced Practice Providers (APPs -  Physician Assistants and Nurse Practitioners) who all work together to provide you with the care you need, when you need it.  We recommend signing up for the patient portal called "MyChart".  Sign up information is provided on this After Visit Summary.  MyChart is used to connect with patients for Virtual Visits (Telemedicine).  Patients are able to view lab/test results, encounter notes, upcoming appointments, etc.  Non-urgent messages can be sent to your provider as well.   To learn more about what you can do with MyChart, go to [email or web address].    Your next appointment:   2  week(s)  The format for your next appointment:   Virtual Visit   Provider:   You will see one of the following Advanced Practice Providers on your designated Care Team:    Richardson Dopp, PA-C  Kathyrn Drown, NP

## 2020-12-08 NOTE — Telephone Encounter (Signed)
Spoke with the pt and gave him Sharee Pimple McDaniel's NP recommendations for him to see his PCP re: his CBC. PT agreed and will call him today to make an appt.

## 2020-12-09 DIAGNOSIS — D649 Anemia, unspecified: Secondary | ICD-10-CM | POA: Diagnosis not present

## 2020-12-09 DIAGNOSIS — R5383 Other fatigue: Secondary | ICD-10-CM | POA: Diagnosis not present

## 2020-12-12 ENCOUNTER — Ambulatory Visit (HOSPITAL_COMMUNITY): Payer: Medicare Other | Attending: Cardiovascular Disease

## 2020-12-12 ENCOUNTER — Other Ambulatory Visit: Payer: Self-pay

## 2020-12-12 DIAGNOSIS — R5383 Other fatigue: Secondary | ICD-10-CM | POA: Diagnosis not present

## 2020-12-12 DIAGNOSIS — R0602 Shortness of breath: Secondary | ICD-10-CM | POA: Insufficient documentation

## 2020-12-12 LAB — ECHOCARDIOGRAM COMPLETE
Area-P 1/2: 4.83 cm2
S' Lateral: 3.5 cm

## 2020-12-13 DIAGNOSIS — D649 Anemia, unspecified: Secondary | ICD-10-CM | POA: Diagnosis not present

## 2020-12-13 DIAGNOSIS — R5383 Other fatigue: Secondary | ICD-10-CM | POA: Diagnosis not present

## 2020-12-13 DIAGNOSIS — E538 Deficiency of other specified B group vitamins: Secondary | ICD-10-CM | POA: Diagnosis not present

## 2020-12-20 DIAGNOSIS — D649 Anemia, unspecified: Secondary | ICD-10-CM | POA: Diagnosis not present

## 2020-12-20 DIAGNOSIS — E538 Deficiency of other specified B group vitamins: Secondary | ICD-10-CM | POA: Diagnosis not present

## 2020-12-25 NOTE — Progress Notes (Signed)
Virtual Visit via Telephone Note   This visit type was conducted due to national recommendations for restrictions regarding the COVID-19 Pandemic (e.g. social distancing) in an effort to limit this patient's exposure and mitigate transmission in our community.  Due to his co-morbid illnesses, this patient is at least at moderate risk for complications without adequate follow up.  This format is felt to be most appropriate for this patient at this time.  The patient did not have access to video technology/had technical difficulties with video requiring transitioning to audio format only (telephone).  All issues noted in this document were discussed and addressed.  No physical exam could be performed with this format.  Please refer to the patient's chart for his  consent to telehealth for [facility].    Date:  12/28/2020   ID:  [name] Kimple [facility] Jr., DOB [birth year], MRN [id number] The patient was identified using 2 identifiers.  Patient Location: Home Provider Location: Office/Clinic   PCP:  Sofie Hartigan, [place]  Cardiologist:  Sherren Mocha, MD   Evaluation Performed:  Follow-Up Visit  Chief Complaint: Follow up   History of Present Illness:    [name] Barfield Mcnaught Jr. is a 50 y.o. male who presents for follow-up of shortness of breath, seen for Dr. Burt Knack.  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  Echocardiogram from 09/09/2018 with LVEF at greater than 55% with mild MR and trace TR. Myoview stress testing from 09/03/2018 with normal perfusion and no evidence of ischemia or infarct.  He was seen by Richardson Dopp, PA on 09/12/2020 in follow-up. At that time, he had recently worn an event monitor which  showed an average heart rate at 105 bpm. His beta-blocker was increased with improvement. Toprol dosing at that time was 100 mg in the morning and 50 mg in the evening plan if unable to tolerate was to transition to ivabradine.   I then saw him in follow up with reports that he would have some dizziness with splitting the Toprol to 100 mg in the a.m. and 50 mg in the p.m.  As [place] suggested, we discussed splitting the Toprol to 75 in the AM and 75 in the PM.  He wished to try this prior to attempting another medication.  He also reported some mild DOE and fatigue. He has nonobstructive CAD per coronary CTA performed in 2020 with last echocardiogram in 2019 with a stable LVEF.    Repeat echo was performed 12/12/20 that showed LVEF at 60-65% with no RWMA with no valvular disease. Unfortunately his labs returned with a Hb at 9.2 (prior 17.2), HCT-32.7, MCV-68, MCH-19.5, and platelets at 481. He says that he was seen by his PCP who ordered more blood work and was ultimately referred to [facility] for further workup. HR today is at 100bpm however he reports this was down to 76bpm at his appointment earlier today. He continues to feel fatigued, weak and SOB>>most likely due to his anemia. He has been started on B12 injections as well by his PCP. He denies chest pain, LE edema, palpitations, or syncope.   The patient does not have symptoms concerning for COVID-19 infection (fever, chills, cough, or new shortness  of breath).   Past Medical History:  Diagnosis Date   CAD (coronary artery disease)    Chronic pain    Diabetes mellitus without complication (HCC)    DJD (degenerative joint disease)    GERD (gastroesophageal reflux disease)    Headache    Hyperlipidemia    Hypertension    Hypogonadism male    Kidney stones    Past Surgical History:  Procedure Laterality Date   BICEPT TENODESIS Left 12/30/2019   Procedure: MINI BICEPS TENODESIS;  Surgeon: Corky Mull, MD;  Location: [facility]  ORS;  Service: Orthopedics;  Laterality: Left;   COLONOSCOPY WITH PROPOFOL N/A 01/05/2019   Procedure: COLONOSCOPY WITH PROPOFOL;  Surgeon: Lollie Sails, MD;  Location: [facility] ENDOSCOPY;  Service: Endoscopy;  Laterality: N/A;   ESOPHAGOGASTRODUODENOSCOPY (EGD) WITH PROPOFOL N/A 01/05/2019   Procedure: ESOPHAGOGASTRODUODENOSCOPY (EGD) WITH PROPOFOL;  Surgeon: Lollie Sails, MD;  Location: [facility] ENDOSCOPY;  Service: Endoscopy;  Laterality: N/A;   EXTRACORPOREAL SHOCK WAVE LITHOTRIPSY Right 05/14/2019   Procedure: EXTRACORPOREAL SHOCK WAVE LITHOTRIPSY (ESWL);  Surgeon: [name], MD;  Location: [facility] ORS;  Service: Urology;  Laterality: Right;   KNEE ARTHROSCOPY     KNEE ARTHROSCOPY WITH MENISCAL REPAIR Right 01/19/2016   Procedure: KNEE ARTHROSCOPY WITH MENISCAL REPAIR;  Surgeon: Corky Mull, MD;  Location: [facility] ORS;  Service: Orthopedics;  Laterality: Right;   KNEE ARTHROSCOPY WITH MENISCAL REPAIR Right 06/12/2016   Procedure: KNEE ARTHROSCOPY WITH PARTIAL MEDIAL MENISCAL REPAIR;  Surgeon: Corky Mull, MD;  Location: [facility] ORS;  Service: Orthopedics;  Laterality: Right;   PARATHYROIDECTOMY     SHOULDER ARTHROSCOPY WITH ROTATOR CUFF REPAIR Left 12/30/2019   Procedure: SHOULDER ARTHROSCOPY WITH ROTATOR CUFF REPAIR;  Surgeon: Corky Mull, MD;  Location: [facility] ORS;  Service: Orthopedics;  Laterality: Left;   SHOULDER SURGERY Bilateral    SPINAL CORD STIMULATOR BATTERY EXCHANGE     SPINAL CORD STIMULATOR IMPLANT  2008   SPINAL FUSION     L4-L5 (2004), L5-S1 (2006)   THYROIDECTOMY, PARTIAL     TONSILLECTOMY       No outpatient medications have been marked as taking for the 12/28/20 encounter (Appointment) with Tommie Raymond, NP.     Allergies:   Pravastatin and Celexa [citalopram]   Social History   Tobacco Use   Smoking status: Never Smoker   Smokeless tobacco: Never Used  Vaping Use   Vaping Use: Never used  Substance Use Topics   Alcohol use: No   Drug  use: No     Family Hx: The patient's family history is negative for Bladder Cancer, Kidney cancer, and Prostate cancer.  ROS:   Please see the history of present illness.     All other systems reviewed and are negative.  Prior CV studies:   The following studies were reviewed today:  Echo 12/12/20:  1. Left ventricular ejection fraction, by estimation, is 60 to 65%. Left  ventricular ejection fraction by 3D volume is 62 %. The left ventricle has  normal function. The left ventricle has no regional wall motion  abnormalities. Left ventricular diastolic  parameters were normal.  2. Right ventricular systolic function is normal. The right ventricular  size is normal.  3. The mitral valve is grossly normal. Mild mitral valve regurgitation.  No evidence of mitral stenosis.  4. The aortic valve is tricuspid. Aortic valve regurgitation is not  visualized. No aortic stenosis is present.  5. The inferior vena cava is normal in size with  greater than 50%  respiratory variability, suggesting right atrial pressure of 3 mmHg.   Labs/Other Tests and Data Reviewed:    EKG:  No ECG reviewed.  Recent Labs: 02/12/2020: ALT 26 12/07/2020: BUN 12; Creatinine, Ser 1.06; Hemoglobin 9.4; Platelets 481; Potassium 4.9; Sodium 136   Recent Lipid Panel Lab Results  Component Value Date/Time   CHOL 132 02/12/2020 08:30 AM   TRIG 59 02/12/2020 08:30 AM   HDL 47 02/12/2020 08:30 AM   CHOLHDL 2.8 02/12/2020 08:30 AM   LDLCALC 73 02/12/2020 08:30 AM    Wt Readings from Last 3 Encounters:  12/07/20 173 lb (78.5 kg)  09/14/20 165 lb (74.8 kg)  09/12/20 165 lb (74.8 kg)     Risk Assessment/Calculations:      Objective:    Vital Signs:  There were no vitals taken for this visit.   VITAL SIGNS:  reviewed GEN:  no acute distress NEURO:  alert and oriented x 3, no obvious focal deficit  ASSESSMENT & PLAN:    1. Sinus tachycardia: -Previously seen by Richardson Dopp, PA for tachycardia  and was trialed on Toprol 150 mg a split with 100 mg in the a.m. and 50 mg in the p.m. He had some dizziness therefore this was decreased to 75am/75pm at last OV with labs. Unfortunately he was found to have anemia with a Hb at 9.2 (prior 17.2), HCT-32.7, MCV-68, MCH-19.5, and platelets at 481.  -He was referred back to his PCP and is currently awaiting [facility] referral  -Most likely tachycardia, SOB and fatigue stemming from underlying blood derangement  -Continue current dose of Toprol at 75am/75pm then follow hem course   2.  DOE: -See plan above  -Echo performed after last OV that showed EF at 60-65% with no RWMA and no valvular disease P  2. Nonobstructive CAD: -Per CTA 03/2019 with 25-49% LAD, 25-49% LCx, 25-49% OM1 -Denies anginal symptoms -Continue aspirin, atorvastatin.  3. Essential hypertension -Stable, 136/76 -Continue Toprol XL split 75 am/75 pm  4. Hyperlipidemia: -Last LDL, 73 on 02/12/2020 -Continue current regimen   5. Anemia: -Hb found to be low at 9.2 when previously WNL at 17.2 in 2020 -Followed with PCP>>>currently receiving B12 injections. Awaiting referral to [facility]    COVID-19 Education: The signs and symptoms of COVID-19 were discussed with the patient and how to seek care for testing (follow up with PCP or arrange E-visit). The importance of social distancing was discussed today.  Time:   Today, I have spent 20 minutes with the patient with telehealth technology discussing the above problems.     Medication Adjustments/Labs and Tests Ordered: Current medicines are reviewed at length with the patient today.  Concerns regarding medicines are outlined above.   Tests Ordered: No orders of the defined types were placed in this encounter.   Medication Changes: No orders of the defined types were placed in this encounter.   Follow Up:  In Person 3 months with Dr. Burt Knack or APP   Signed, Kathyrn Drown, NP  12/28/2020 2:53 PM    [facility]

## 2020-12-28 ENCOUNTER — Telehealth (INDEPENDENT_AMBULATORY_CARE_PROVIDER_SITE_OTHER): Payer: Medicare Other | Admitting: Cardiology

## 2020-12-28 ENCOUNTER — Encounter: Payer: Self-pay | Admitting: Cardiology

## 2020-12-28 ENCOUNTER — Other Ambulatory Visit: Payer: Self-pay

## 2020-12-28 VITALS — BP 136/76 | HR 100 | Ht 66.0 in | Wt 165.0 lb

## 2020-12-28 DIAGNOSIS — I10 Essential (primary) hypertension: Secondary | ICD-10-CM | POA: Diagnosis not present

## 2020-12-28 DIAGNOSIS — E1169 Type 2 diabetes mellitus with other specified complication: Secondary | ICD-10-CM | POA: Diagnosis not present

## 2020-12-28 DIAGNOSIS — R000 Tachycardia, unspecified: Secondary | ICD-10-CM

## 2020-12-28 DIAGNOSIS — D649 Anemia, unspecified: Secondary | ICD-10-CM | POA: Diagnosis not present

## 2020-12-28 DIAGNOSIS — M2550 Pain in unspecified joint: Secondary | ICD-10-CM | POA: Diagnosis not present

## 2020-12-28 DIAGNOSIS — E538 Deficiency of other specified B group vitamins: Secondary | ICD-10-CM | POA: Diagnosis not present

## 2020-12-28 DIAGNOSIS — R5383 Other fatigue: Secondary | ICD-10-CM

## 2020-12-28 DIAGNOSIS — R0602 Shortness of breath: Secondary | ICD-10-CM

## 2020-12-28 DIAGNOSIS — I2510 Atherosclerotic heart disease of native coronary artery without angina pectoris: Secondary | ICD-10-CM | POA: Diagnosis not present

## 2020-12-28 DIAGNOSIS — G4701 Insomnia due to medical condition: Secondary | ICD-10-CM | POA: Diagnosis not present

## 2020-12-28 DIAGNOSIS — E785 Hyperlipidemia, unspecified: Secondary | ICD-10-CM

## 2020-12-28 DIAGNOSIS — G8921 Chronic pain due to trauma: Secondary | ICD-10-CM | POA: Diagnosis not present

## 2020-12-28 DIAGNOSIS — K219 Gastro-esophageal reflux disease without esophagitis: Secondary | ICD-10-CM | POA: Diagnosis not present

## 2020-12-28 DIAGNOSIS — E221 Hyperprolactinemia: Secondary | ICD-10-CM | POA: Diagnosis not present

## 2020-12-28 DIAGNOSIS — E291 Testicular hypofunction: Secondary | ICD-10-CM | POA: Diagnosis not present

## 2020-12-28 NOTE — Patient Instructions (Addendum)
Medication Instructions:  Your physician recommends that you continue on your current medications as directed. Please refer to the Current Medication list given to you today.  *If you need a refill on your cardiac medications before your next appointment, please call your pharmacy*   Lab Work: NONE If you have labs (blood work) drawn today and your tests are completely normal, you will receive your results only by: Marland Kitchen MyChart Message (if you have MyChart) OR . A paper copy in the mail If you have any lab test that is abnormal or we need to change your treatment, we will call you to review the results.   Testing/Procedures: NONE   Follow-Up: At [facility], you and your health needs are our priority.  As part of our continuing mission to provide you with exceptional heart care, we have created designated Provider Care Teams.  These Care Teams include your primary Cardiologist (physician) and Advanced Practice Providers (APPs -  Physician Assistants and Nurse Practitioners) who all work together to provide you with the care you need, when you need it.  We recommend signing up for the patient portal called "MyChart".  Sign up information is provided on this After Visit Summary.  MyChart is used to connect with patients for Virtual Visits (Telemedicine).  Patients are able to view lab/test results, encounter notes, upcoming appointments, etc.  Non-urgent messages can be sent to your provider as well.   To learn more about what you can do with MyChart, go to [email or web address].    Your next appointment:    April 17, 2021 AT 11:45 AM  The format for your next appointment:   VIRTUAL  Provider:   Kathyrn Drown, NP
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
## 2021-01-03 ENCOUNTER — Encounter: Payer: Self-pay | Admitting: *Deleted

## 2021-01-03 ENCOUNTER — Encounter: Payer: Self-pay | Admitting: Hematology and Oncology

## 2021-01-03 ENCOUNTER — Other Ambulatory Visit: Payer: Self-pay

## 2021-01-03 ENCOUNTER — Inpatient Hospital Stay: Payer: Medicare Other | Attending: Hematology and Oncology | Admitting: Hematology and Oncology

## 2021-01-03 ENCOUNTER — Inpatient Hospital Stay: Payer: Medicare Other

## 2021-01-03 VITALS — BP 122/81 | HR 92 | Temp 97.0°F | Resp 20 | Wt 172.0 lb

## 2021-01-03 DIAGNOSIS — Z7984 Long term (current) use of oral hypoglycemic drugs: Secondary | ICD-10-CM | POA: Insufficient documentation

## 2021-01-03 DIAGNOSIS — Z7982 Long term (current) use of aspirin: Secondary | ICD-10-CM | POA: Diagnosis not present

## 2021-01-03 DIAGNOSIS — Z79899 Other long term (current) drug therapy: Secondary | ICD-10-CM | POA: Diagnosis not present

## 2021-01-03 DIAGNOSIS — E538 Deficiency of other specified B group vitamins: Secondary | ICD-10-CM

## 2021-01-03 DIAGNOSIS — I2510 Atherosclerotic heart disease of native coronary artery without angina pectoris: Secondary | ICD-10-CM | POA: Diagnosis not present

## 2021-01-03 DIAGNOSIS — I10 Essential (primary) hypertension: Secondary | ICD-10-CM | POA: Insufficient documentation

## 2021-01-03 DIAGNOSIS — D510 Vitamin B12 deficiency anemia due to intrinsic factor deficiency: Secondary | ICD-10-CM | POA: Insufficient documentation

## 2021-01-03 DIAGNOSIS — D509 Iron deficiency anemia, unspecified: Secondary | ICD-10-CM

## 2021-01-03 DIAGNOSIS — E119 Type 2 diabetes mellitus without complications: Secondary | ICD-10-CM | POA: Insufficient documentation

## 2021-01-03 LAB — CBC WITH DIFFERENTIAL/PLATELET
Abs Immature Granulocytes: 0.02 10*3/uL (ref 0.00–0.07)
Basophils Absolute: 0 10*3/uL (ref 0.0–0.1)
Basophils Relative: 1 %
Eosinophils Absolute: 0.1 10*3/uL (ref 0.0–0.5)
Eosinophils Relative: 1 %
HCT: 32.6 % — ABNORMAL LOW (ref 39.0–52.0)
Hemoglobin: 9.3 g/dL — ABNORMAL LOW (ref 13.0–17.0)
Immature Granulocytes: 0 %
Lymphocytes Relative: 31 %
Lymphs Abs: 1.7 10*3/uL (ref 0.7–4.0)
MCH: 18.9 pg — ABNORMAL LOW (ref 26.0–34.0)
MCHC: 28.5 g/dL — ABNORMAL LOW (ref 30.0–36.0)
MCV: 66.1 fL — ABNORMAL LOW (ref 80.0–100.0)
Monocytes Absolute: 0.4 10*3/uL (ref 0.1–1.0)
Monocytes Relative: 8 %
Neutro Abs: 3.2 10*3/uL (ref 1.7–7.7)
Neutrophils Relative %: 59 %
Platelets: 481 10*3/uL — ABNORMAL HIGH (ref 150–400)
RBC: 4.93 MIL/uL (ref 4.22–5.81)
RDW: 24.6 % — ABNORMAL HIGH (ref 11.5–15.5)
WBC: 5.4 10*3/uL (ref 4.0–10.5)
nRBC: 0 % (ref 0.0–0.2)

## 2021-01-03 LAB — FERRITIN: Ferritin: 4 ng/mL — ABNORMAL LOW (ref 24–336)

## 2021-01-03 LAB — IRON AND TIBC
Iron: 11 ug/dL — ABNORMAL LOW (ref 45–182)
Saturation Ratios: 2 % — ABNORMAL LOW (ref 17.9–39.5)
TIBC: 503 ug/dL — ABNORMAL HIGH (ref 250–450)
UIBC: 492 ug/dL

## 2021-01-03 LAB — FOLATE: Folate: 18.7 ng/mL (ref >5.9)

## 2021-01-03 NOTE — Progress Notes (Signed)
Patient here today for initial evaluation for anemia. Patient denies any bleeding. Patient reports fatigue and lack of energy, shortness of breath.

## 2021-01-03 NOTE — Patient Instructions (Addendum)

## 2021-01-04 LAB — ANTI-PARIETAL ANTIBODY: Parietal Cell Antibody-IgG: 2.7 Units (ref 0.0–20.0)

## 2021-01-05 ENCOUNTER — Inpatient Hospital Stay: Payer: Medicare Other

## 2021-01-05 ENCOUNTER — Other Ambulatory Visit: Payer: Self-pay

## 2021-01-05 VITALS — BP 119/73 | HR 93

## 2021-01-05 DIAGNOSIS — Z79899 Other long term (current) drug therapy: Secondary | ICD-10-CM | POA: Diagnosis not present

## 2021-01-05 DIAGNOSIS — D510 Vitamin B12 deficiency anemia due to intrinsic factor deficiency: Secondary | ICD-10-CM | POA: Diagnosis not present

## 2021-01-05 DIAGNOSIS — Z7982 Long term (current) use of aspirin: Secondary | ICD-10-CM | POA: Diagnosis not present

## 2021-01-05 DIAGNOSIS — I10 Essential (primary) hypertension: Secondary | ICD-10-CM | POA: Diagnosis not present

## 2021-01-05 DIAGNOSIS — Z7984 Long term (current) use of oral hypoglycemic drugs: Secondary | ICD-10-CM | POA: Diagnosis not present

## 2021-01-05 DIAGNOSIS — D509 Iron deficiency anemia, unspecified: Secondary | ICD-10-CM | POA: Diagnosis not present

## 2021-01-05 DIAGNOSIS — E119 Type 2 diabetes mellitus without complications: Secondary | ICD-10-CM | POA: Diagnosis not present

## 2021-01-05 LAB — INTRINSIC FACTOR ANTIBODIES: Intrinsic Factor: 14.5 AU/mL — ABNORMAL HIGH (ref 0.0–1.1)

## 2021-01-05 MED ORDER — SODIUM CHLORIDE 0.9 % IV SOLN: 200.0000 mg | Freq: Once | INTRAVENOUS | Status: DC

## 2021-01-05 MED ORDER — IRON SUCROSE 20 MG/ML IV SOLN
200.0000 mg | Freq: Once | INTRAVENOUS | Status: AC
  Administered 2021-01-05: 200 mg via INTRAVENOUS
  Filled 2021-01-05: qty 10

## 2021-01-05 MED ORDER — SODIUM CHLORIDE 0.9 % IV SOLN
Freq: Once | INTRAVENOUS | Status: AC
  Filled 2021-01-05: qty 250

## 2021-01-06 ENCOUNTER — Encounter: Payer: Self-pay | Admitting: Hematology and Oncology

## 2021-01-10 DIAGNOSIS — E538 Deficiency of other specified B group vitamins: Secondary | ICD-10-CM | POA: Diagnosis not present
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
## 2021-01-12 ENCOUNTER — Inpatient Hospital Stay (HOSPITAL_BASED_OUTPATIENT_CLINIC_OR_DEPARTMENT_OTHER): Payer: Medicare Other | Admitting: Hematology and Oncology

## 2021-01-12 ENCOUNTER — Other Ambulatory Visit: Payer: Self-pay

## 2021-01-12 ENCOUNTER — Encounter: Payer: Self-pay | Admitting: Hematology and Oncology

## 2021-01-12 ENCOUNTER — Inpatient Hospital Stay: Payer: Medicare Other

## 2021-01-12 VITALS — BP 123/86 | HR 107 | Temp 97.6°F | Resp 18 | Wt 169.1 lb

## 2021-01-12 VITALS — BP 127/74 | HR 90 | Resp 18

## 2021-01-12 DIAGNOSIS — D510 Vitamin B12 deficiency anemia due to intrinsic factor deficiency: Secondary | ICD-10-CM | POA: Insufficient documentation

## 2021-01-12 DIAGNOSIS — D509 Iron deficiency anemia, unspecified: Secondary | ICD-10-CM | POA: Diagnosis not present

## 2021-01-12 DIAGNOSIS — E538 Deficiency of other specified B group vitamins: Secondary | ICD-10-CM | POA: Diagnosis not present

## 2021-01-12 DIAGNOSIS — Z7982 Long term (current) use of aspirin: Secondary | ICD-10-CM | POA: Diagnosis not present

## 2021-01-12 DIAGNOSIS — Z7984 Long term (current) use of oral hypoglycemic drugs: Secondary | ICD-10-CM | POA: Diagnosis not present

## 2021-01-12 DIAGNOSIS — I2510 Atherosclerotic heart disease of native coronary artery without angina pectoris: Secondary | ICD-10-CM | POA: Diagnosis not present

## 2021-01-12 DIAGNOSIS — I10 Essential (primary) hypertension: Secondary | ICD-10-CM | POA: Diagnosis not present

## 2021-01-12 DIAGNOSIS — Z79899 Other long term (current) drug therapy: Secondary | ICD-10-CM | POA: Diagnosis not present

## 2021-01-12 DIAGNOSIS — E119 Type 2 diabetes mellitus without complications: Secondary | ICD-10-CM | POA: Diagnosis not present

## 2021-01-12 LAB — CBC
HCT: 37.1 % — ABNORMAL LOW (ref 39.0–52.0)
Hemoglobin: 10.7 g/dL — ABNORMAL LOW (ref 13.0–17.0)
MCH: 20 pg — ABNORMAL LOW (ref 26.0–34.0)
MCHC: 28.8 g/dL — ABNORMAL LOW (ref 30.0–36.0)
MCV: 69.5 fL — ABNORMAL LOW (ref 80.0–100.0)
Platelets: 434 10*3/uL — ABNORMAL HIGH (ref 150–400)
RBC: 5.34 MIL/uL (ref 4.22–5.81)
RDW: 28.9 % — ABNORMAL HIGH (ref 11.5–15.5)
WBC: 5.6 10*3/uL (ref 4.0–10.5)
nRBC: 0 % (ref 0.0–0.2)

## 2021-01-12 MED ORDER — IRON SUCROSE 20 MG/ML IV SOLN
200.0000 mg | Freq: Once | INTRAVENOUS | Status: AC
  Administered 2021-01-12: 200 mg via INTRAVENOUS
  Filled 2021-01-12: qty 10

## 2021-01-12 MED ORDER — SODIUM CHLORIDE 0.9 % IV SOLN: 200.0000 mg | Freq: Once | INTRAVENOUS | Status: DC

## 2021-01-12 MED ORDER — SODIUM CHLORIDE 0.9 % IV SOLN
Freq: Once | INTRAVENOUS | Status: AC
  Filled 2021-01-12: qty 250

## 2021-01-12 NOTE — Progress Notes (Signed)
Patient here for oncology follow-up appointment, expresses concerns of dizziness 

## 2021-01-12 NOTE — Progress Notes (Signed)
Pt received prescribed treatment in clinic, pt stable at d/c. 

## 2021-01-16 ENCOUNTER — Encounter: Payer: Self-pay | Admitting: Hematology and Oncology

## 2021-01-16 ENCOUNTER — Telehealth: Payer: Self-pay | Admitting: *Deleted

## 2021-01-16 NOTE — Telephone Encounter (Signed)
Ok. Thanks. Please reach out to the patient and see what he would like to do.

## 2021-01-16 NOTE — Telephone Encounter (Signed)
Stephanie/Brooke. Patient was to have weekly venofer x 3 weeks. His first venofer was last week, but the second venofer is not scheduled until next week. Could you clarify with the patient why pt was not scheduled this week. I was not sure if this was due to chair availability. Thanks.

## 2021-01-16 NOTE — Telephone Encounter (Signed)
Dr. Loletha Grayer - patient sent the following mychart message. Please review and provide feedback   Hi Dr. Mike Gip, I have an iron infusion appointment every week except this week. It that on purpose or is it a mistake? Thanks

## 2021-01-16 NOTE — Telephone Encounter (Signed)
When I scheduled the pt's follow-ups he specifically asked for those two dates, he said he has other obligations and those were the two days/times that suited his schedule best. If he wants to change the dates I am more than happy to do so.

## 2021-01-16 NOTE — Telephone Encounter (Signed)
  Patient was to have Venofer weekly x 3.  M

## 2021-01-17 NOTE — Telephone Encounter (Signed)
01/17/2021 Left VM for pt w/ call back number if he has any questions or still needs to change his appts  SRW

## 2021-01-19 ENCOUNTER — Inpatient Hospital Stay: Payer: Medicare Other

## 2021-01-19 ENCOUNTER — Other Ambulatory Visit: Payer: Self-pay

## 2021-01-19 VITALS — BP 129/75 | HR 96 | Temp 97.2°F

## 2021-01-19 DIAGNOSIS — E119 Type 2 diabetes mellitus without complications: Secondary | ICD-10-CM | POA: Diagnosis not present

## 2021-01-19 DIAGNOSIS — I10 Essential (primary) hypertension: Secondary | ICD-10-CM | POA: Diagnosis not present

## 2021-01-19 DIAGNOSIS — D509 Iron deficiency anemia, unspecified: Secondary | ICD-10-CM

## 2021-01-19 DIAGNOSIS — D510 Vitamin B12 deficiency anemia due to intrinsic factor deficiency: Secondary | ICD-10-CM | POA: Diagnosis not present

## 2021-01-19 DIAGNOSIS — Z7984 Long term (current) use of oral hypoglycemic drugs: Secondary | ICD-10-CM | POA: Diagnosis not present

## 2021-01-19 DIAGNOSIS — Z79899 Other long term (current) drug therapy: Secondary | ICD-10-CM | POA: Diagnosis not present

## 2021-01-19 DIAGNOSIS — Z7982 Long term (current) use of aspirin: Secondary | ICD-10-CM | POA: Diagnosis not present

## 2021-01-19 MED ORDER — SODIUM CHLORIDE 0.9 % IV SOLN: 200.0000 mg | Freq: Once | INTRAVENOUS | Status: DC

## 2021-01-19 MED ORDER — SODIUM CHLORIDE 0.9 % IV SOLN
Freq: Once | INTRAVENOUS | Status: AC
  Filled 2021-01-19: qty 250

## 2021-01-19 MED ORDER — IRON SUCROSE 20 MG/ML IV SOLN
200.0000 mg | Freq: Once | INTRAVENOUS | Status: AC
  Administered 2021-01-19: 200 mg via INTRAVENOUS
  Filled 2021-01-19: qty 10

## 2021-01-24 ENCOUNTER — Inpatient Hospital Stay: Payer: Medicare Other

## 2021-01-26 ENCOUNTER — Inpatient Hospital Stay: Payer: Medicare Other

## 2021-01-30 DIAGNOSIS — I152 Hypertension secondary to endocrine disorders: Secondary | ICD-10-CM | POA: Diagnosis not present

## 2021-01-30 DIAGNOSIS — E291 Testicular hypofunction: Secondary | ICD-10-CM | POA: Diagnosis not present

## 2021-01-30 DIAGNOSIS — E221 Hyperprolactinemia: Secondary | ICD-10-CM | POA: Diagnosis not present

## 2021-01-30 DIAGNOSIS — E1159 Type 2 diabetes mellitus with other circulatory complications: Secondary | ICD-10-CM | POA: Diagnosis not present

## 2021-01-30 DIAGNOSIS — E1169 Type 2 diabetes mellitus with other specified complication: Secondary | ICD-10-CM | POA: Diagnosis not present

## 2021-01-30 DIAGNOSIS — E785 Hyperlipidemia, unspecified: Secondary | ICD-10-CM | POA: Diagnosis not present

## 2021-01-31 ENCOUNTER — Inpatient Hospital Stay: Payer: Medicare Other

## 2021-02-06 DIAGNOSIS — G43719 Chronic migraine without aura, intractable, without status migrainosus: Secondary | ICD-10-CM | POA: Diagnosis not present

## 2021-02-20 ENCOUNTER — Encounter: Payer: Self-pay | Admitting: Nurse Practitioner

## 2021-02-21 DIAGNOSIS — G894 Chronic pain syndrome: Secondary | ICD-10-CM | POA: Diagnosis not present

## 2021-02-21 DIAGNOSIS — Z79899 Other long term (current) drug therapy: Secondary | ICD-10-CM | POA: Diagnosis not present

## 2021-02-21 DIAGNOSIS — M533 Sacrococcygeal disorders, not elsewhere classified: Secondary | ICD-10-CM | POA: Diagnosis not present

## 2021-02-21 DIAGNOSIS — M25559 Pain in unspecified hip: Secondary | ICD-10-CM | POA: Diagnosis not present

## 2021-02-21 DIAGNOSIS — M47814 Spondylosis without myelopathy or radiculopathy, thoracic region: Secondary | ICD-10-CM | POA: Diagnosis not present

## 2021-02-21 DIAGNOSIS — M1612 Unilateral primary osteoarthritis, left hip: Secondary | ICD-10-CM | POA: Diagnosis not present

## 2021-02-21 DIAGNOSIS — M25552 Pain in left hip: Secondary | ICD-10-CM | POA: Diagnosis not present

## 2021-02-21 DIAGNOSIS — M25551 Pain in right hip: Secondary | ICD-10-CM | POA: Diagnosis not present

## 2021-02-21 DIAGNOSIS — M5412 Radiculopathy, cervical region: Secondary | ICD-10-CM | POA: Diagnosis not present

## 2021-02-21 DIAGNOSIS — M791 Myalgia, unspecified site: Secondary | ICD-10-CM | POA: Diagnosis not present

## 2021-02-22 DIAGNOSIS — J301 Allergic rhinitis due to pollen: Secondary | ICD-10-CM | POA: Diagnosis not present

## 2021-02-27 ENCOUNTER — Other Ambulatory Visit: Payer: Self-pay

## 2021-02-27 ENCOUNTER — Inpatient Hospital Stay: Payer: Medicare Other | Attending: Nurse Practitioner

## 2021-02-27 DIAGNOSIS — D509 Iron deficiency anemia, unspecified: Secondary | ICD-10-CM | POA: Insufficient documentation

## 2021-02-27 DIAGNOSIS — E119 Type 2 diabetes mellitus without complications: Secondary | ICD-10-CM | POA: Diagnosis not present

## 2021-02-27 DIAGNOSIS — K449 Diaphragmatic hernia without obstruction or gangrene: Secondary | ICD-10-CM | POA: Diagnosis not present

## 2021-02-27 DIAGNOSIS — Z79899 Other long term (current) drug therapy: Secondary | ICD-10-CM | POA: Diagnosis not present

## 2021-02-27 DIAGNOSIS — E785 Hyperlipidemia, unspecified: Secondary | ICD-10-CM | POA: Insufficient documentation

## 2021-02-27 DIAGNOSIS — E538 Deficiency of other specified B group vitamins: Secondary | ICD-10-CM | POA: Diagnosis not present

## 2021-02-27 DIAGNOSIS — D510 Vitamin B12 deficiency anemia due to intrinsic factor deficiency: Secondary | ICD-10-CM

## 2021-02-27 LAB — CBC WITH DIFFERENTIAL/PLATELET
Abs Immature Granulocytes: 0.03 10*3/uL (ref 0.00–0.07)
Basophils Absolute: 0 10*3/uL (ref 0.0–0.1)
Basophils Relative: 1 %
Eosinophils Absolute: 0.1 10*3/uL (ref 0.0–0.5)
Eosinophils Relative: 2 %
HCT: 47.1 % (ref 39.0–52.0)
Hemoglobin: 15.5 g/dL (ref 13.0–17.0)
Immature Granulocytes: 1 %
Lymphocytes Relative: 30 %
Lymphs Abs: 1.9 10*3/uL (ref 0.7–4.0)
MCH: 25.9 pg — ABNORMAL LOW (ref 26.0–34.0)
MCHC: 32.9 g/dL (ref 30.0–36.0)
MCV: 78.8 fL — ABNORMAL LOW (ref 80.0–100.0)
Monocytes Absolute: 0.4 10*3/uL (ref 0.1–1.0)
Monocytes Relative: 7 %
Neutro Abs: 3.9 10*3/uL (ref 1.7–7.7)
Neutrophils Relative %: 59 %
Platelets: 310 10*3/uL (ref 150–400)
RBC: 5.98 MIL/uL — ABNORMAL HIGH (ref 4.22–5.81)
RDW: 27.8 % — ABNORMAL HIGH (ref 11.5–15.5)
WBC: 6.4 10*3/uL (ref 4.0–10.5)
nRBC: 0 % (ref 0.0–0.2)

## 2021-02-27 LAB — IRON AND TIBC
Iron: 32 ug/dL — ABNORMAL LOW (ref 45–182)
Saturation Ratios: 9 % — ABNORMAL LOW (ref 17.9–39.5)
TIBC: 378 ug/dL (ref 250–450)
UIBC: 346 ug/dL

## 2021-02-27 LAB — FERRITIN: Ferritin: 7 ng/mL — ABNORMAL LOW (ref 24–336)

## 2021-02-28 ENCOUNTER — Inpatient Hospital Stay: Payer: Medicare Other

## 2021-02-28 ENCOUNTER — Inpatient Hospital Stay (HOSPITAL_BASED_OUTPATIENT_CLINIC_OR_DEPARTMENT_OTHER): Payer: Medicare Other | Admitting: Nurse Practitioner

## 2021-02-28 VITALS — BP 126/78 | HR 86

## 2021-02-28 VITALS — BP 125/87 | HR 97 | Temp 97.4°F | Resp 18 | Wt 170.4 lb

## 2021-02-28 DIAGNOSIS — K449 Diaphragmatic hernia without obstruction or gangrene: Secondary | ICD-10-CM | POA: Diagnosis not present

## 2021-02-28 DIAGNOSIS — D509 Iron deficiency anemia, unspecified: Secondary | ICD-10-CM

## 2021-02-28 DIAGNOSIS — Z79899 Other long term (current) drug therapy: Secondary | ICD-10-CM | POA: Diagnosis not present

## 2021-02-28 DIAGNOSIS — I2510 Atherosclerotic heart disease of native coronary artery without angina pectoris: Secondary | ICD-10-CM

## 2021-02-28 DIAGNOSIS — E538 Deficiency of other specified B group vitamins: Secondary | ICD-10-CM | POA: Diagnosis not present

## 2021-02-28 DIAGNOSIS — E785 Hyperlipidemia, unspecified: Secondary | ICD-10-CM | POA: Diagnosis not present

## 2021-02-28 DIAGNOSIS — E119 Type 2 diabetes mellitus without complications: Secondary | ICD-10-CM | POA: Diagnosis not present

## 2021-02-28 MED ORDER — SODIUM CHLORIDE 0.9 % IV SOLN
Freq: Once | INTRAVENOUS | Status: AC
  Filled 2021-02-28: qty 250

## 2021-02-28 MED ORDER — IRON SUCROSE 20 MG/ML IV SOLN
200.0000 mg | Freq: Once | INTRAVENOUS | Status: AC
  Administered 2021-02-28: 200 mg via INTRAVENOUS
  Filled 2021-02-28: qty 10

## 2021-02-28 MED ORDER — SODIUM CHLORIDE 0.9 % IV SOLN: 200.0000 mg | Freq: Once | INTRAVENOUS | Status: DC

## 2021-02-28 NOTE — Progress Notes (Signed)
Pt states he gets hungry but does not have an appetite. Will crave foods like oatmeal
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
## 2021-03-01 NOTE — Patient Instructions (Addendum)
Iron Deficiency Anemia, Adult Iron deficiency anemia is when you do not have enough red blood cells or hemoglobin in your blood. This happens because you have too little iron in your body. Hemoglobin carries oxygen to parts of the body. Anemia can cause your body to not get enough oxygen. What are the causes?  Not eating enough foods that have iron in them.  The body not being able to take in iron well.  Needing more iron due to pregnancy or heavy menstrual periods, for females.  Cancer.  Bleeding in the bowels.  Many blood draws. What increases the risk?  Being pregnant.  Being a teenage girl going through a growth spurt. What are the signs or symptoms?  Pale skin, lips, and nails.  Weakness, dizziness, and getting tired easily.  Headache.  Feeling like you cannot breathe well when moving (shortness of breath).  Cold hands and feet.  Fast heartbeat or a heartbeat that is not regular.  Feeling grouchy (irritable) or breathing fast. These are more common in very bad anemia. Mild anemia may not cause any symptoms. How is this treated? This condition is treated by finding out why you do not have enough iron and then getting more iron. It may include:  Adding foods to your diet that have a lot of iron.  Taking iron pills (supplements). If you are pregnant or breastfeeding, you may need to take extra iron. Your diet often does not provide the amount of iron that you need.  Getting more vitamin C in your diet. Vitamin C helps your body take in iron. You may need to take iron pills with a glass of orange juice or vitamin C pills.  Medicines to make heavy menstrual periods lighter.  Surgery. You may need blood tests to see if treatment is working. If the treatment does not seem to be working, you may need more tests. Follow these instructions at home: Medicines  Take over-the-counter and prescription medicines only as told by your doctor. This includes iron pills and  vitamins. ? Take iron pills when your stomach is empty. If you cannot handle this, take them with food. ? Do not drink milk or take antacids at the same time as your iron pills. ? Iron pills may turn your poop (stool)black.  If you cannot handle taking iron pills by mouth, ask your doctor about getting iron through: ? An IV tube. ? A shot (injection) into a muscle. Eating and drinking  Talk with your doctor before changing the foods you eat. He or she may tell you to eat foods that have a lot of iron, such as: ? Liver. ? Low-fat (lean) beef. ? Breads and cereals that have iron added to them. ? Eggs. ? Dried fruit. ? Dark green, leafy vegetables.  Eat fresh fruits and vegetables that are high in vitamin C. They help your body use iron. Foods with a lot of vitamin C include: ? Oranges. ? Peppers. ? Tomatoes. ? Mangoes.  Drink enough fluid to keep your pee (urine) pale yellow.   Managing constipation If you are taking iron pills, they may cause trouble pooping (constipation). To prevent or treat trouble pooping, you may need to:  Take over-the-counter or prescription medicines.  Eat foods that are high in fiber. These include beans, whole grains, and fresh fruits and vegetables.  Limit foods that are high in fat and sugar. These include fried or sweet foods. General instructions  Return to your normal activities as told by your doctor.   Ask your doctor what activities are safe for you.  Keep yourself clean, and keep things clean around you.  Keep all follow-up visits as told by your doctor. This is important. Contact a doctor if:  You feel like you may vomit (nauseous), or you vomit.  You feel weak.  You are sweating for no reason.  You have trouble pooping, such as: ? Pooping less than 3 times a week. ? Straining to poop. ? Having poop that is hard, dry, or larger than normal. ? Feeling full or bloated. ? Pain in the lower belly. ? Not feeling better after  pooping. Get help right away if:  You pass out (faint).  You have chest pain.  You have trouble breathing that: ? Is very bad. ? Gets worse with physical activity.  You have a fast heartbeat, or a heartbeat that does not feel regular.  You get light-headed when getting up from sitting or lying down. These symptoms may be an emergency. Do not wait to see if the symptoms will go away. Get medical help right away. Call your local emergency services (911 in the [place] Do not drive yourself to the hospital. Summary  Iron deficiency anemia is when you have too little iron in your body.  This condition is treated by finding out why you do not have enough iron in your body and then getting more iron.  Take over-the-counter and prescription medicines only as told by your doctor.  Eat fresh fruits and vegetables that are high in vitamin C.  Get help right away if you cannot breathe well. This information is not intended to replace advice given to you by your health care provider. Make sure you discuss any questions you have with your health care provider. Document Revised: 06/16/2019 Document Reviewed: 06/16/2019 Elsevier Patient Education  2021 [place].  Vitamin B12 Deficiency Vitamin B12 deficiency means that your body does not have enough vitamin B12. The body needs this vitamin:  To make red blood cells.  To make genes (DNA).  To help the nerves work. If you do not have enough vitamin B12 in your body, you can have health problems. What are the causes?  Not eating enough foods that contain vitamin B12.  Not being able to absorb vitamin B12 from the food that you eat.  Certain digestive system diseases.  A condition in which the body does not make enough of a certain protein, which results in too few red blood cells (pernicious anemia).  Having a surgery in which part of the stomach or small intestine is removed.  Taking medicines that make it hard for the body to  absorb vitamin B12. These medicines include: ? Heartburn medicines. ? Some antibiotic medicines. ? Other medicines that are used to treat certain conditions. What increases the risk?  Being older than age 28.  Eating a vegetarian or vegan diet, especially while you are pregnant.  Eating a poor diet while you are pregnant.  Taking certain medicines.  Having alcoholism. What are the signs or symptoms? In some cases, there are no symptoms. If the condition leads to too few blood cells or nerve damage, symptoms can occur, such as:  Feeling weak.  Feeling tired (fatigued).  Not being hungry.  Weight loss.  A loss of feeling (numbness) or tingling in your hands and feet.  Redness and burning of the tongue.  Being mixed up (confused) or having memory problems.  Sadness (depression).  Problems with your senses. This can include color  blindness, ringing in the ears, or loss of taste.  Watery poop (diarrhea) or trouble pooping (constipation).  Trouble walking. If anemia is very bad, symptoms can include:  Being short of breath.  Being dizzy.  Having a very fast heartbeat. How is this treated?  Changing the way you eat and drink, such as: ? Eating more foods that contain vitamin B12. ? Drinking little or no alcohol.  Getting vitamin B12 shots.  Taking vitamin B12 supplements. Your doctor will tell you the dose that is best for you. Follow these instructions at home: Eating and drinking  Eat lots of healthy foods that contain vitamin B12. These include: ? Meats and poultry, such as beef, pork, chicken, [place], and organ meats, such as liver. ? Seafood, such as clams, rainbow trout, salmon, tuna, and haddock. ? Eggs. ? Cereal and dairy products that have vitamin B12 added to them. Check the label. The items listed above may not be a complete list of what you can eat and drink. Contact a dietitian for more options.   General instructions  Get any shots as told by  your doctor.  Take supplements only as told by your doctor.  Do not drink alcohol if your doctor tells you not to. In some cases, you may only be asked to limit alcohol use.  Keep all follow-up visits as told by your doctor. This is important. Contact a doctor if:  Your symptoms come back. Get help right away if:  You have trouble breathing.  You have a very fast heartbeat.  You have chest pain.  You get dizzy.  You pass out. Summary  Vitamin B12 deficiency means that your body is not getting enough vitamin B12.  In some cases, there are no symptoms of this condition.  Treatment may include making a change in the way you eat and drink, getting vitamin B12 shots, or taking supplements.  Eat lots of healthy foods that contain vitamin B12. This information is not intended to replace advice given to you by your health care provider. Make sure you discuss any questions you have with your health care provider. Document Revised: 06/17/2018 Document Reviewed: 06/17/2018 Elsevier Patient Education  2021 [employer].

## 2021-03-02 DIAGNOSIS — J301 Allergic rhinitis due to pollen: Secondary | ICD-10-CM | POA: Diagnosis not present

## 2021-03-03 DIAGNOSIS — J301 Allergic rhinitis due to pollen: Secondary | ICD-10-CM | POA: Diagnosis not present

## 2021-03-07 ENCOUNTER — Encounter: Payer: Self-pay | Admitting: Nurse Practitioner

## 2021-03-07 ENCOUNTER — Other Ambulatory Visit: Payer: Self-pay

## 2021-03-07 ENCOUNTER — Inpatient Hospital Stay: Payer: Medicare Other

## 2021-03-07 VITALS — BP 138/87 | HR 102 | Temp 96.0°F | Resp 18

## 2021-03-07 DIAGNOSIS — E538 Deficiency of other specified B group vitamins: Secondary | ICD-10-CM | POA: Diagnosis not present

## 2021-03-07 DIAGNOSIS — E119 Type 2 diabetes mellitus without complications: Secondary | ICD-10-CM | POA: Diagnosis not present

## 2021-03-07 DIAGNOSIS — D509 Iron deficiency anemia, unspecified: Secondary | ICD-10-CM | POA: Diagnosis not present

## 2021-03-07 DIAGNOSIS — Z79899 Other long term (current) drug therapy: Secondary | ICD-10-CM | POA: Diagnosis not present

## 2021-03-07 DIAGNOSIS — E785 Hyperlipidemia, unspecified: Secondary | ICD-10-CM | POA: Diagnosis not present

## 2021-03-07 DIAGNOSIS — K449 Diaphragmatic hernia without obstruction or gangrene: Secondary | ICD-10-CM | POA: Diagnosis not present

## 2021-03-07 MED ORDER — SODIUM CHLORIDE 0.9 % IV SOLN
Freq: Once | INTRAVENOUS | Status: AC
Start: 2021-03-07 — End: 2021-03-07
  Filled 2021-03-07: qty 250

## 2021-03-07 MED ORDER — IRON SUCROSE 20 MG/ML IV SOLN
200.0000 mg | Freq: Once | INTRAVENOUS | Status: AC
Start: 2021-03-07 — End: 2021-03-07
  Administered 2021-03-07: 200 mg via INTRAVENOUS
  Filled 2021-03-07: qty 10

## 2021-03-07 MED ORDER — SODIUM CHLORIDE 0.9 % IV SOLN: 200.0000 mg | Freq: Once | INTRAVENOUS | Status: DC

## 2021-03-14 ENCOUNTER — Other Ambulatory Visit: Payer: Self-pay

## 2021-03-14 ENCOUNTER — Inpatient Hospital Stay: Payer: Medicare Other

## 2021-03-14 VITALS — BP 134/84 | HR 83 | Temp 96.5°F | Resp 18

## 2021-03-14 DIAGNOSIS — E785 Hyperlipidemia, unspecified: Secondary | ICD-10-CM | POA: Diagnosis not present

## 2021-03-14 DIAGNOSIS — D509 Iron deficiency anemia, unspecified: Secondary | ICD-10-CM

## 2021-03-14 DIAGNOSIS — E119 Type 2 diabetes mellitus without complications: Secondary | ICD-10-CM | POA: Diagnosis not present

## 2021-03-14 DIAGNOSIS — E538 Deficiency of other specified B group vitamins: Secondary | ICD-10-CM | POA: Diagnosis not present

## 2021-03-14 DIAGNOSIS — K449 Diaphragmatic hernia without obstruction or gangrene: Secondary | ICD-10-CM | POA: Diagnosis not present

## 2021-03-14 DIAGNOSIS — Z79899 Other long term (current) drug therapy: Secondary | ICD-10-CM | POA: Diagnosis not present

## 2021-03-14 MED ORDER — IRON SUCROSE 20 MG/ML IV SOLN
200.0000 mg | Freq: Once | INTRAVENOUS | Status: AC
  Administered 2021-03-14: 200 mg via INTRAVENOUS
  Filled 2021-03-14: qty 10

## 2021-03-14 MED ORDER — SODIUM CHLORIDE 0.9 % IV SOLN: 200.0000 mg | Freq: Once | INTRAVENOUS | Status: DC

## 2021-03-14 MED ORDER — SODIUM CHLORIDE 0.9 % IV SOLN
Freq: Once | INTRAVENOUS | Status: AC
  Filled 2021-03-14: qty 250

## 2021-03-15 ENCOUNTER — Encounter: Payer: Self-pay | Admitting: *Deleted

## 2021-03-15 ENCOUNTER — Encounter: Payer: Self-pay | Admitting: Nurse Practitioner

## 2021-03-21 ENCOUNTER — Telehealth: Payer: Self-pay

## 2021-03-21 ENCOUNTER — Other Ambulatory Visit: Payer: Self-pay

## 2021-03-21 ENCOUNTER — Inpatient Hospital Stay: Payer: Medicare Other

## 2021-03-21 VITALS — BP 118/70 | HR 78 | Temp 96.9°F | Resp 18

## 2021-03-21 DIAGNOSIS — E538 Deficiency of other specified B group vitamins: Secondary | ICD-10-CM | POA: Diagnosis not present

## 2021-03-21 DIAGNOSIS — E785 Hyperlipidemia, unspecified: Secondary | ICD-10-CM | POA: Diagnosis not present

## 2021-03-21 DIAGNOSIS — Z79899 Other long term (current) drug therapy: Secondary | ICD-10-CM | POA: Diagnosis not present

## 2021-03-21 DIAGNOSIS — E119 Type 2 diabetes mellitus without complications: Secondary | ICD-10-CM | POA: Diagnosis not present

## 2021-03-21 DIAGNOSIS — D509 Iron deficiency anemia, unspecified: Secondary | ICD-10-CM

## 2021-03-21 DIAGNOSIS — K449 Diaphragmatic hernia without obstruction or gangrene: Secondary | ICD-10-CM | POA: Diagnosis not present

## 2021-03-21 MED ORDER — IRON SUCROSE 20 MG/ML IV SOLN
200.0000 mg | Freq: Once | INTRAVENOUS | Status: AC
  Administered 2021-03-21: 200 mg via INTRAVENOUS
  Filled 2021-03-21: qty 10

## 2021-03-21 MED ORDER — SODIUM CHLORIDE 0.9 % IV SOLN
Freq: Once | INTRAVENOUS | Status: AC
  Filled 2021-03-21: qty 250

## 2021-03-21 MED ORDER — SODIUM CHLORIDE 0.9 % IV SOLN: 200.0000 mg | Freq: Once | INTRAVENOUS | Status: DC

## 2021-03-21 NOTE — Patient Instructions (Signed)

## 2021-03-21 NOTE — Telephone Encounter (Signed)
Tried to contact pt to see how he is feeling today due to pt message on Friday; did not contact pt and VM is full so not able to leave a message. Per Ander Purpura, since pt is receiving iron he should be feeling better and not worse. Depending on todays sx, we will try to get him to see GI sooner than the 6th.

## 2021-03-21 NOTE — Progress Notes (Signed)
Pt reports this is his 4th venofer treatment today. It has not started making him feel any better yet. Having raw pain, tenderness in upper abdomen. Referral appt with GI is Monday 03/27/21. Tolerated his venofer infusion well. Discharged to home.

## 2021-03-27 ENCOUNTER — Ambulatory Visit (INDEPENDENT_AMBULATORY_CARE_PROVIDER_SITE_OTHER): Payer: Medicare Other | Admitting: Gastroenterology

## 2021-03-27 ENCOUNTER — Other Ambulatory Visit: Payer: Self-pay

## 2021-03-27 ENCOUNTER — Encounter: Payer: Self-pay | Admitting: Gastroenterology

## 2021-03-27 VITALS — BP 131/83 | HR 90 | Temp 97.5°F | Ht 66.0 in | Wt 168.0 lb

## 2021-03-27 DIAGNOSIS — D510 Vitamin B12 deficiency anemia due to intrinsic factor deficiency: Secondary | ICD-10-CM

## 2021-03-27 DIAGNOSIS — D509 Iron deficiency anemia, unspecified: Secondary | ICD-10-CM | POA: Diagnosis not present

## 2021-03-27 NOTE — Progress Notes (Signed)
Gastroenterology Consultation  Referring Provider:     Lequita Asal, MD Primary Care Physician:  Sofie Hartigan, MD Primary Gastroenterologist:  Dr. Allen Norris     Reason for Consultation:     Iron deficiency anemia        HPI:   [name] Buchheit [facility]. is a 50 y.o. y/o male referred for consultation & management of Iron deficiency anemia by Dr. Ellison Hughs, Chrissie Noa, MD.  This patient comes today with a history of iron deficiency anemia.  The patient was referred over by oncology hematology.  The patient had an EGD and colonoscopy for abdominal pain and change in bowel habits back in 2020 by Dr. Gustavo Lah.  Multiple biopsies were taken in throughout the GI tract and showed:  DIAGNOSIS:  A. DUODENUM; COLD BIOPSY:  - DUODENAL MUCOSA WITH INTACT VILLI.  - NEGATIVE FOR ACTIVE INFLAMMATION, INTRAEPITHELIAL LYMPHOCYTOSIS, AND INFECTIOUS AGENTS.  B. DUODENAL BULB ATYPICAL MUCOSA; COLD BIOPSY:  - GASTRIC HETEROTOPIA.  - NEGATIVE FOR ACTIVE INFLAMMATION, DYSPLASIA, AND MALIGNANCY.  C. STOMACH, ANTRUM; COLD BIOPSY:  - OXYNTIC-TYPE MUCOSA WITHOUT PATHOLOGIC CHANGES.  - NEGATIVE FOR H. PYLORI, INTESTINAL METAPLASIA, DYSPLASIA, AND  MALIGNANCY.  D. STOMACH, BODY; COLD BIOPSY:  - OXYNTIC MUCOSA WITH PROTON PUMP INHIBITOR EFFECT.  - NEGATIVE FOR H. PYLORI, INTESTINAL METAPLASIA, DYSPLASIA, AND  MALIGNANCY.  E. STOMACH POLYPS; COLD BIOPSY:  - FUNDIC GLAND POLYPS, 2 FRAGMENTS.  - NEGATIVE FOR DYSPLASIA AND MALIGNANCY.  F. GASTROESOPHAGEAL JUNCTION; COLD BIOPSY:  - SQUAMOCOLUMNAR MUCOSA WITH MILD CHRONIC INFLAMMATION.  - NEGATIVE FOR GOBLET CELLS, DYSPLASIA, AND MALIGNANCY.  G. COLON POLYP, DISTAL ASCENDING; COLD BIOPSY:  - TUBULAR ADENOMA.  - NEGATIVE FOR HIGH-GRADE DYSPLASIA AND MALIGNANCY.  H. RIGHT COLON; RANDOM COLD BIOPSY:  - COLONIC MUCOSA WITH INTACT CRYPT ARCHITECTURE.  - NEGATIVE FOR MICROSCOPIC COLITIS AND DYSPLASIA.  I. LEFT COLON; RANDOM COLD BIOPSY:  - COLONIC MUCOSA  WITH INTACT CRYPT ARCHITECTURE, 2 FRAGMENTS.  - ONE FRAGMENT WITH HISTOLOGIC FEATURES SUGGESTIVE OF HYPERPLASTIC  POLYP.  - NEGATIVE FOR MICROSCOPIC COLITIS, DYSPLASIA, AND MALIGNANCY.   To some up the pathology there is no cause for his anemia and a adenoma was found in the ascending colon.  There is also suggestion on random colon biopsy that a fragment of a hyperplastic polyp was also removed.The patient CBC has shown:  Component     Latest Ref Rng & Units 12/07/2020 01/03/2021 01/12/2021 02/27/2021  Hemoglobin     13.0 - 17.0 g/dL 9.4 (L) 9.3 (L) 10.7 (L) 15.5  HCT     39.0 - 52.0 % 32.7 (L) 32.6 (L) 37.1 (L) 47.1   And his iron studies were consistent with iron deficiency anemia showing:  Component     Latest Ref Rng & Units 01/03/2021 02/27/2021  Iron     45 - 182 ug/dL 11 (L) 32 (L)  TIBC     250 - 450 ug/dL 503 (H) 378  Saturation Ratios     17.9 - 39.5 % 2 (L) 9 (L)  UIBC     ug/dL 492 346   His ferritin was also low.  The patient denies any bright red blood per rectum or black stools.  He also denies having any other visible blood loss.  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 He reports that he felt much better after having his B12 and iron infusions.   The patient also reports that he has some left-sided abdominal pain that has been  going on for some time and he states that is the reason he had his EGD and colonoscopy the first place.  No source for his pain was noted on his exams.  The patient states that it is a burning pain and sometimes made worse when he eats and sometimes worse when he is hungry.  Past Medical History:  Diagnosis Date  . CAD (coronary artery disease)   . Chronic pain   . Diabetes mellitus without complication ([place])   . DJD (degenerative joint disease)   . GERD (gastroesophageal reflux disease)   . Headache   . Hyperlipidemia   . Hypertension   . Hypogonadism male   . Kidney stones     Past Surgical History:  Procedure  Laterality Date  . BICEPT TENODESIS Left 12/30/2019   Procedure: MINI BICEPS TENODESIS;  Surgeon: Corky Mull, MD;  Location: [facility] ORS;  Service: Orthopedics;  Laterality: Left;  . COLONOSCOPY WITH PROPOFOL N/A 01/05/2019   Procedure: COLONOSCOPY WITH PROPOFOL;  Surgeon: Lollie Sails, MD;  Location: [facility] ENDOSCOPY;  Service: Endoscopy;  Laterality: N/A;  . ESOPHAGOGASTRODUODENOSCOPY (EGD) WITH PROPOFOL N/A 01/05/2019   Procedure: ESOPHAGOGASTRODUODENOSCOPY (EGD) WITH PROPOFOL;  Surgeon: Lollie Sails, MD;  Location: [facility] ENDOSCOPY;  Service: Endoscopy;  Laterality: N/A;  . EXTRACORPOREAL SHOCK WAVE LITHOTRIPSY Right 05/14/2019   Procedure: EXTRACORPOREAL SHOCK WAVE LITHOTRIPSY (ESWL);  Surgeon: [name], MD;  Location: [facility] ORS;  Service: Urology;  Laterality: Right;  . KNEE ARTHROSCOPY    . KNEE ARTHROSCOPY WITH MENISCAL REPAIR Right 01/19/2016   Procedure: KNEE ARTHROSCOPY WITH MENISCAL REPAIR;  Surgeon: Corky Mull, MD;  Location: [facility] ORS;  Service: Orthopedics;  Laterality: Right;  . KNEE ARTHROSCOPY WITH MENISCAL REPAIR Right 06/12/2016   Procedure: KNEE ARTHROSCOPY WITH PARTIAL MEDIAL MENISCAL REPAIR;  Surgeon: Corky Mull, MD;  Location: [facility] ORS;  Service: Orthopedics;  Laterality: Right;  . PARATHYROIDECTOMY    . SHOULDER ARTHROSCOPY WITH ROTATOR CUFF REPAIR Left 12/30/2019   Procedure: SHOULDER ARTHROSCOPY WITH ROTATOR CUFF REPAIR;  Surgeon: Corky Mull, MD;  Location: [facility] ORS;  Service: Orthopedics;  Laterality: Left;  . SHOULDER SURGERY Bilateral   . SPINAL CORD STIMULATOR BATTERY EXCHANGE    . SPINAL CORD STIMULATOR IMPLANT  2008  . SPINAL FUSION     L4-L5 (2004), L5-S1 (2006)  . THYROIDECTOMY, PARTIAL    . TONSILLECTOMY      Prior to Admission medications   Medication Sig Start Date End Date Taking? Authorizing Provider  ACCU-CHEK GUIDE test strip  01/24/21   [provider]  Accu-Chek Softclix Lancets lancets SMARTSIG:Topical 01/24/21   [provider]  acyclovir (ZOVIRAX) 200 MG capsule Take 200 mg by mouth 2 (two) times daily as needed (fever blisters).     [provider]  aspirin EC 81 MG tablet Take 1 tablet (81 mg total) by mouth daily. 02/22/20   Sherren Mocha, MD  atorvastatin (LIPITOR) 40 MG tablet Take 1 tablet (40 mg total) by mouth daily. 10/19/20   Sherren Mocha, MD  cholecalciferol (VITAMIN D) 1000 units tablet Take 1,000 Units by mouth daily.    [provider]  cyanocobalamin (,VITAMIN B-12,) 1000 MCG/ML injection Inject into the muscle. 01/10/21 01/05/22  [provider]  Cysteamine Bitartrate (PROCYSBI) 300 MG PACK Use to check blood sugar once daily E11.9 01/24/21   [provider]  EPINEPHrine 0.3 mg/0.3 mL IJ SOAJ injection Inject 0.3 mg into the muscle as needed for anaphylaxis. 12/11/19   [provider]  hydrOXYzine (ATARAX/VISTARIL) 10 MG tablet Take 10 mg by mouth 3 (three) times daily as needed for itching.  Patient not taking: No sig reported    [provider]  metFORMIN (GLUCOPHAGE-XR) 500 MG 24 hr tablet Take 500 mg by mouth 2 (two) times daily. 11/20/19   [provider]  METOPROLOL SUCCINATE ER PO Take 75 mg by mouth in the morning and at bedtime.    [provider]  nortriptyline (PAMELOR) 50 MG capsule Take 50 mg by mouth at bedtime.  11/03/19   [provider]  NURTEC 75 MG TBDP TAKE 1 TABLET (75 MG TOTAL) BY MOUTH EVERY OTHER DAY 02/06/21   [provider]  OXYCONTIN 10 MG 12 hr tablet Take 10 mg by mouth in the morning and at bedtime. 11/09/19   [provider]  pantoprazole (PROTONIX) 40 MG tablet Take 40 mg by mouth daily. 12/09/18   [provider]  SUMAtriptan (IMITREX) 100 MG tablet Take 100 mg by mouth every 2 (two) hours as needed for migraine.  10/06/19   [provider]  SYRINGE-NEEDLE, DISP, 3 ML (MONOJECT 3CC SYR 23GX1") 23G X 1" 3 ML MISC Use 1 Syringe as directed 01/17/21    [provider]  tamsulosin (FLOMAX) 0.4 MG CAPS capsule Take 1 capsule (0.4 mg total) by mouth daily. Patient not taking: Reported on 02/28/2021 03/30/19   [name], MD  testosterone cypionate (DEPOTESTOSTERONE CYPIONATE) 200 MG/ML injection Inject 200 mg into the muscle every 7 (seven) days.    [provider]  traZODone (DESYREL) 50 MG tablet Take 25 mg by mouth at bedtime as needed for sleep.     [provider]    Family History  Problem Relation Age of Onset  . Bladder Cancer Neg Hx   . Kidney cancer Neg Hx   . Prostate cancer Neg Hx      Social History   Tobacco Use  . Smoking status: Never Smoker  . Smokeless tobacco: Never Used  Vaping Use  . Vaping Use: Never used  Substance Use Topics  . Alcohol use: No  . Drug use: No    Allergies as of 03/27/2021 - Review Complete 02/28/2021  Allergen Reaction Noted  . Pravastatin Other (See Comments) 01/17/2016  . Celexa [citalopram] Rash 01/17/2016    Review of Systems:    All systems reviewed and negative except where noted in HPI.   Physical Exam:  There were no vitals taken for this visit. No LMP for male patient. General:   Alert,  Well-developed, well-nourished, pleasant and cooperative in NAD Head:  Normocephalic and atraumatic. Eyes:  Sclera clear, no icterus.   Conjunctiva pink. Ears:  Normal auditory acuity. Neck:  Supple; no masses or thyromegaly. Lungs:  Respirations even and unlabored.  Clear throughout to auscultation.   No wheezes, crackles, or rhonchi. No acute distress. Heart:  Regular rate and rhythm; no murmurs, clicks, rubs, or gallops. Abdomen:  Normal bowel sounds.  No bruits.  Soft, non-tender and non-distended without masses, hepatosplenomegaly or hernias noted.  No guarding or rebound tenderness.  Negative Carnett sign.   Rectal:  Deferred.  Pulses:  Normal pulses noted. Extremities:  No clubbing or edema.  No cyanosis. Neurologic:  Alert and oriented x3;  grossly  normal neurologically. Skin:  Intact without significant lesions or rashes.  No jaundice. Lymph Nodes:  No significant cervical adenopathy. Psych:  Alert and cooperative. Normal mood and affect.  Imaging Studies: No results found.  Assessment and Plan:   [name] Campoy Coster Jr. is a 50 y.o. y/o male Who comes in today with a history of iron deficiency anemia with a negative EGD and colonoscopy in the past.  The patient also was found to have B12 deficiency and parietal cell antibody positive.  The patient will be set up for blood work to check his homocystine level and LDH. He will also have his methylmalonic acid level checked.  The patient will be set up for a capsule endoscopy to look for a source of bleeding in his small bowel.  The patient has been explained the plan and agrees with it.    Lucilla Lame, MD. Marval Regal    Note: This dictation was prepared with Dragon dictation along with smaller phrase technology. Any transcriptional errors that result from this process are unintentional.

## 2021-03-28 ENCOUNTER — Inpatient Hospital Stay (HOSPITAL_BASED_OUTPATIENT_CLINIC_OR_DEPARTMENT_OTHER): Payer: Medicare Other | Admitting: Hospice and Palliative Medicine

## 2021-03-28 ENCOUNTER — Inpatient Hospital Stay: Payer: Medicare Other | Attending: Hematology and Oncology

## 2021-03-28 ENCOUNTER — Encounter: Payer: Self-pay | Admitting: Nurse Practitioner

## 2021-03-28 ENCOUNTER — Telehealth: Payer: Self-pay

## 2021-03-28 ENCOUNTER — Other Ambulatory Visit: Payer: Self-pay

## 2021-03-28 VITALS — BP 127/91 | HR 108 | Temp 97.4°F | Resp 18 | Wt 165.0 lb

## 2021-03-28 DIAGNOSIS — I2510 Atherosclerotic heart disease of native coronary artery without angina pectoris: Secondary | ICD-10-CM | POA: Insufficient documentation

## 2021-03-28 DIAGNOSIS — E119 Type 2 diabetes mellitus without complications: Secondary | ICD-10-CM | POA: Insufficient documentation

## 2021-03-28 DIAGNOSIS — Z79899 Other long term (current) drug therapy: Secondary | ICD-10-CM | POA: Insufficient documentation

## 2021-03-28 DIAGNOSIS — Z7982 Long term (current) use of aspirin: Secondary | ICD-10-CM | POA: Diagnosis not present

## 2021-03-28 DIAGNOSIS — D510 Vitamin B12 deficiency anemia due to intrinsic factor deficiency: Secondary | ICD-10-CM

## 2021-03-28 DIAGNOSIS — I10 Essential (primary) hypertension: Secondary | ICD-10-CM | POA: Insufficient documentation

## 2021-03-28 DIAGNOSIS — Z7984 Long term (current) use of oral hypoglycemic drugs: Secondary | ICD-10-CM | POA: Insufficient documentation

## 2021-03-28 DIAGNOSIS — D508 Other iron deficiency anemias: Secondary | ICD-10-CM | POA: Diagnosis not present

## 2021-03-28 DIAGNOSIS — E538 Deficiency of other specified B group vitamins: Secondary | ICD-10-CM

## 2021-03-28 DIAGNOSIS — D509 Iron deficiency anemia, unspecified: Secondary | ICD-10-CM | POA: Diagnosis not present

## 2021-03-28 DIAGNOSIS — R109 Unspecified abdominal pain: Secondary | ICD-10-CM | POA: Diagnosis not present

## 2021-03-28 DIAGNOSIS — E785 Hyperlipidemia, unspecified: Secondary | ICD-10-CM | POA: Insufficient documentation

## 2021-03-28 LAB — CBC WITH DIFFERENTIAL/PLATELET
Abs Immature Granulocytes: 0.03 K/uL (ref 0.00–0.07)
Basophils Absolute: 0 K/uL (ref 0.0–0.1)
Basophils Relative: 1 %
Eosinophils Absolute: 0 K/uL (ref 0.0–0.5)
Eosinophils Relative: 0 %
HCT: 48.6 % (ref 39.0–52.0)
Hemoglobin: 16.5 g/dL (ref 13.0–17.0)
Immature Granulocytes: 0 %
Lymphocytes Relative: 23 %
Lymphs Abs: 1.6 K/uL (ref 0.7–4.0)
MCH: 27.8 pg (ref 26.0–34.0)
MCHC: 34 g/dL (ref 30.0–36.0)
MCV: 82 fL (ref 80.0–100.0)
Monocytes Absolute: 0.5 K/uL (ref 0.1–1.0)
Monocytes Relative: 6 %
Neutro Abs: 5 K/uL (ref 1.7–7.7)
Neutrophils Relative %: 70 %
Platelets: 229 K/uL (ref 150–400)
RBC: 5.93 MIL/uL — ABNORMAL HIGH (ref 4.22–5.81)
RDW: 22.5 % — ABNORMAL HIGH (ref 11.5–15.5)
WBC: 7.2 K/uL (ref 4.0–10.5)
nRBC: 0 % (ref 0.0–0.2)

## 2021-03-28 LAB — IRON AND TIBC
Iron: 121 ug/dL (ref 45–182)
Saturation Ratios: 36 % (ref 17.9–39.5)
TIBC: 333 ug/dL (ref 250–450)
UIBC: 212 ug/dL

## 2021-03-28 LAB — VITAMIN B12: Vitamin B-12: 437 pg/mL (ref 180–914)

## 2021-03-28 LAB — COMPREHENSIVE METABOLIC PANEL WITH GFR
ALT: 40 U/L (ref 0–44)
AST: 31 U/L (ref 15–41)
Albumin: 4.4 g/dL (ref 3.5–5.0)
Alkaline Phosphatase: 52 U/L (ref 38–126)
Anion gap: 12 (ref 5–15)
BUN: 8 mg/dL (ref 6–20)
CO2: 27 mmol/L (ref 22–32)
Calcium: 9.4 mg/dL (ref 8.9–10.3)
Chloride: 98 mmol/L (ref 98–111)
Creatinine, Ser: 0.96 mg/dL (ref 0.61–1.24)
GFR, Estimated: >60 mL/min
Glucose, Bld: 170 mg/dL — ABNORMAL HIGH (ref 70–99)
Potassium: 4.2 mmol/L (ref 3.5–5.1)
Sodium: 137 mmol/L (ref 135–145)
Total Bilirubin: 0.3 mg/dL (ref 0.3–1.2)
Total Protein: 7.3 g/dL (ref 6.5–8.1)

## 2021-03-28 LAB — FERRITIN: Ferritin: 188 ng/mL (ref 24–336)

## 2021-03-28 LAB — FOLATE: Folate: 18.9 ng/mL

## 2021-03-28 NOTE — Telephone Encounter (Signed)
Called patient regarding his mychart message to [facility]. He states that he feels "off" and has a constant headache along with SOB and "symptoms of anemia."  Added him on to schedule to see Lauren and have labs done today.

## 2021-03-28 NOTE — Progress Notes (Signed)
Patient reports feel "off", constant headache, and "symptoms of anemia" "queezy"

## 2021-03-28 NOTE — Progress Notes (Signed)
Symptom Management [place]  Telephone:(336) [phone] Fax:(336) [phone]  Patient Care Team: Sofie Hartigan, MD as PCP - General (Family Medicine) Sherren Mocha, MD as PCP - Cardiology (Cardiology) Lequita Asal, MD as Referring Physician (Hematology and Oncology)   Name of the patient: [name] Brown  [id number]  November 25, 1970   Date of visit: 03/28/21  Reason for Consult: [name] Brown is a 50 year old man with multiple medical problems including IDA and B12 deficiency who is followed by Dr. Mike Gip and has been managed chronically with oral and IV iron.  Etiology for IDA is presumed to be due to GI loss.  Patient had colonoscopy and EGD on 01/05/2019 which revealed some gastritis and polyps but no active bleeding.  Patient saw Beckey Rutter, NP on 02/28/2021 and received IV Venofer.  Patient saw Dr. Alauna Hayden Norris with GI on 03/27/2021 with plan for capsule endoscopy in the next several weeks.  Patient presents to [facility] today for routine follow-up of labs.  He denies any significant changes or concerns today but continues to endorse chronic fatigue and just not feeling well over the past year.  He denies overt signs of bleeding.  No melena or hematochezia.  He says that he has not felt improvement after IV Venofer as he did in the past.  Patient does endorse chronic epigastric discomfort which she describes as feeling "raw".  This discomfort is worse when his stomach is empty.  He has occasional nausea but no vomiting.  He says he does not have much of an appetite but denies weight loss.  No abdominal pain or distention.  No fever or chills.  No diarrhea or constipation.  Patient offers no further specific complaints today.  PAST MEDICAL HISTORY: Past Medical History:  Diagnosis Date  . CAD (coronary artery disease)   . Chronic pain   . Diabetes mellitus without complication ([place])   . DJD (degenerative joint disease)   . GERD (gastroesophageal  reflux disease)   . Headache   . Hyperlipidemia   . Hypertension   . Hypogonadism male   . Kidney stones     PAST SURGICAL HISTORY:  Past Surgical History:  Procedure Laterality Date  . BICEPT TENODESIS Left 12/30/2019   Procedure: MINI BICEPS TENODESIS;  Surgeon: Corky Mull, MD;  Location: [facility] ORS;  Service: Orthopedics;  Laterality: Left;  . COLONOSCOPY WITH PROPOFOL N/A 01/05/2019   Procedure: COLONOSCOPY WITH PROPOFOL;  Surgeon: Lollie Sails, MD;  Location: [facility] ENDOSCOPY;  Service: Endoscopy;  Laterality: N/A;  . ESOPHAGOGASTRODUODENOSCOPY (EGD) WITH PROPOFOL N/A 01/05/2019   Procedure: ESOPHAGOGASTRODUODENOSCOPY (EGD) WITH PROPOFOL;  Surgeon: Lollie Sails, MD;  Location: [facility] ENDOSCOPY;  Service: Endoscopy;  Laterality: N/A;  . EXTRACORPOREAL SHOCK WAVE LITHOTRIPSY Right 05/14/2019   Procedure: EXTRACORPOREAL SHOCK WAVE LITHOTRIPSY (ESWL);  Surgeon: [name], MD;  Location: [facility] ORS;  Service: Urology;  Laterality: Right;  . KNEE ARTHROSCOPY    . KNEE ARTHROSCOPY WITH MENISCAL REPAIR Right 01/19/2016   Procedure: KNEE ARTHROSCOPY WITH MENISCAL REPAIR;  Surgeon: Corky Mull, MD;  Location: [facility] ORS;  Service: Orthopedics;  Laterality: Right;  . KNEE ARTHROSCOPY WITH MENISCAL REPAIR Right 06/12/2016   Procedure: KNEE ARTHROSCOPY WITH PARTIAL MEDIAL MENISCAL REPAIR;  Surgeon: Corky Mull, MD;  Location: [facility] ORS;  Service: Orthopedics;  Laterality: Right;  . PARATHYROIDECTOMY    . SHOULDER ARTHROSCOPY WITH ROTATOR CUFF REPAIR Left 12/30/2019   Procedure: SHOULDER ARTHROSCOPY WITH ROTATOR CUFF REPAIR;  Surgeon: Corky Mull,  MD;  Location: [facility] ORS;  Service: Orthopedics;  Laterality: Left;  . SHOULDER SURGERY Bilateral   . SPINAL CORD STIMULATOR BATTERY EXCHANGE    . SPINAL CORD STIMULATOR IMPLANT  2008  . SPINAL FUSION     L4-L5 (2004), L5-S1 (2006)  . THYROIDECTOMY, PARTIAL    . TONSILLECTOMY      HEMATOLOGY/ONCOLOGY HISTORY:  Oncology History   No  history exists.    ALLERGIES:  is allergic to pravastatin and celexa [citalopram].  MEDICATIONS:  Current Outpatient Medications  Medication Sig Dispense Refill  . ACCU-CHEK GUIDE test strip     . Accu-Chek Softclix Lancets lancets SMARTSIG:Topical    . acyclovir (ZOVIRAX) 200 MG capsule Take 200 mg by mouth 2 (two) times daily as needed (fever blisters).     Marland Kitchen aspirin EC 81 MG tablet Take 1 tablet (81 mg total) by mouth daily. (Patient not taking: No sig reported) 90 tablet 3  . atorvastatin (LIPITOR) 40 MG tablet Take 1 tablet (40 mg total) by mouth daily. 90 tablet 3  . Blood Glucose Monitoring Suppl (ACCU-CHEK GUIDE) w/Device KIT     . cholecalciferol (VITAMIN D) 1000 units tablet Take 1,000 Units by mouth daily.    . cyanocobalamin (,VITAMIN B-12,) 1000 MCG/ML injection Inject into the muscle.    . Cysteamine Bitartrate (PROCYSBI) 300 MG PACK Use to check blood sugar once daily E11.9 (Patient not taking: No sig reported)    . EPINEPHrine 0.3 mg/0.3 mL IJ SOAJ injection Inject 0.3 mg into the muscle as needed for anaphylaxis.    . hydrOXYzine (ATARAX/VISTARIL) 10 MG tablet Take 10 mg by mouth 3 (three) times daily as needed for itching.  (Patient not taking: No sig reported)    . metFORMIN (GLUCOPHAGE-XR) 500 MG 24 hr tablet Take 500 mg by mouth 2 (two) times daily.    Marland Kitchen METOPROLOL SUCCINATE ER PO Take 75 mg by mouth in the morning and at bedtime.    . nortriptyline (PAMELOR) 50 MG capsule Take 50 mg by mouth at bedtime.     . NURTEC 75 MG TBDP TAKE 1 TABLET (75 MG TOTAL) BY MOUTH EVERY OTHER DAY    . OXYCONTIN 10 MG 12 hr tablet Take 10 mg by mouth in the morning and at bedtime.    . pantoprazole (PROTONIX) 40 MG tablet Take 40 mg by mouth daily.    . SUMAtriptan (IMITREX) 100 MG tablet Take 100 mg by mouth every 2 (two) hours as needed for migraine.     Marland Kitchen SYRINGE-NEEDLE, DISP, 3 ML (MONOJECT 3CC SYR 23GX1") 23G X 1" 3 ML MISC Use 1 Syringe as directed    . tamsulosin (FLOMAX) 0.4 MG  CAPS capsule Take 1 capsule (0.4 mg total) by mouth daily. 30 capsule 3  . testosterone cypionate (DEPOTESTOSTERONE CYPIONATE) 200 MG/ML injection Inject 200 mg into the muscle every 7 (seven) days.    . traZODone (DESYREL) 50 MG tablet Take 25 mg by mouth at bedtime as needed for sleep.      No current facility-administered medications for this visit.    VITAL SIGNS: BP (!) 127/91 (BP Location: Left Arm, Patient Position: Sitting)   Pulse (!) 108   Temp (!) 97.4 F (36.3 C) (Tympanic)   Resp 18   Wt 165 lb (74.8 kg)   BMI 26.63 kg/m  Filed Weights   03/28/21 1145  Weight: 165 lb (74.8 kg)    Estimated body mass index is 26.63 kg/m as calculated from the following:  Height as of 03/27/21: $RemoveBe'5\' 6"'nhTlgNnND$  (1.676 m).   Weight as of this encounter: 165 lb (74.8 kg).  LABS: CBC:    Component Value Date/Time   WBC 7.2 03/28/2021 1133   HGB 16.5 03/28/2021 1133   HGB 9.4 (L) 12/07/2020 0940   HCT 48.6 03/28/2021 1133   HCT 32.7 (L) 12/07/2020 0940   PLT 229 03/28/2021 1133   PLT 481 (H) 12/07/2020 0940   MCV 82.0 03/28/2021 1133   MCV 68 (L) 12/07/2020 0940   MCV 97 03/15/2014 1502   NEUTROABS 5.0 03/28/2021 1133   LYMPHSABS 1.6 03/28/2021 1133   MONOABS 0.5 03/28/2021 1133   EOSABS 0.0 03/28/2021 1133   BASOSABS 0.0 03/28/2021 1133   Comprehensive Metabolic Panel:    Component Value Date/Time   NA 137 03/28/2021 1133   NA 136 12/07/2020 0940   NA 139 03/15/2014 1502   K 4.2 03/28/2021 1133   K 3.8 03/15/2014 1502   CL 98 03/28/2021 1133   CL 102 03/15/2014 1502   CO2 27 03/28/2021 1133   CO2 30 03/15/2014 1502   BUN 8 03/28/2021 1133   BUN 12 12/07/2020 0940   BUN 10 03/15/2014 1502   CREATININE 0.96 03/28/2021 1133   CREATININE 1.17 03/15/2014 1502   GLUCOSE 170 (H) 03/28/2021 1133   GLUCOSE 78 03/15/2014 1502   CALCIUM 9.4 03/28/2021 1133   CALCIUM 8.8 03/15/2014 1502   AST 31 03/28/2021 1133   AST 16 03/15/2014 1502   ALT 40 03/28/2021 1133   ALT 31 03/15/2014  1502   ALKPHOS 52 03/28/2021 1133   ALKPHOS 62 03/15/2014 1502   BILITOT 0.3 03/28/2021 1133   BILITOT 0.3 03/15/2014 1502   PROT 7.3 03/28/2021 1133   PROT 7.1 03/15/2014 1502   ALBUMIN 4.4 03/28/2021 1133   ALBUMIN 3.7 03/15/2014 1502    RADIOGRAPHIC STUDIES: No results found.  PERFORMANCE STATUS (ECOG) : 0 - Asymptomatic  Review of Systems Unless otherwise noted, a complete review of systems is negative.  Physical Exam General: NAD Cardiovascular: regular rate and rhythm Pulmonary: clear ant fields Abdomen: soft, nontender, + bowel sounds GU: no suprapubic tenderness Extremities: no edema, no joint deformities Skin: no rashes Neurological: Grossly nonfocal  Assessment and Plan- Patient is a 50 y.o. male with IDA and B12 deficiency on oral and IV iron who is currently undergoing GI work-up who presents to [facility] for labs.  IDA -patient had weekly Venofer starting 02/28/2021 and ending 03/21/2021.  Recheck iron panel.  Hemoglobin is stable. Patient is pending capsule endoscopy.  B12 deficiency -patient administers IM monthly.  Recheck B12.  MMA pending.  Gastric pain -patient is being followed by Dr. [place] Throckmorton Norris and is pending capsule endoscopy.  If capsule endoscopy is unrevealing, consider abdominal imaging with CT or MRI.  Patient is on PPI daily.  Could consider increasing pantoprazole to twice daily.  However, I would recommend checking H. pylori and patient would likely have to hold PPI temporarily.  Message sent to GI.   Case and plan discussed with Beckey Rutter, NP.  Patient is scheduled to see Dr. Janese Banks in July.  Recommend follow-up with Chaden Doom in 2 to 3 weeks following his capsule endoscopy.   Patient expressed understanding and was in agreement with this plan. He also understands that He can call clinic at any time with any questions, concerns, or complaints.   Thank you for allowing me to participate in the care of this very pleasant patient.   Time Total: 40  minutes  Visit consisted of counseling and education dealing with the complex and emotionally intense issues of symptom management and palliative care in the setting of serious and potentially life-threatening illness.Greater than 50%  of this time was spent counseling and coordinating care related to the above assessment and plan.  Signed by: Altha Harm, PhD, NP-C

## 2021-03-31 LAB — HOMOCYSTEINE: Homocysteine: 11.6 umol/L (ref 0.0–14.5)

## 2021-03-31 LAB — LACTATE DEHYDROGENASE: LDH: 158 IU/L (ref 121–224)

## 2021-03-31 LAB — METHYLMALONIC ACID, SERUM: Methylmalonic Acid: 152 nmol/L (ref 0–378)

## 2021-04-03 ENCOUNTER — Telehealth: Payer: Self-pay

## 2021-04-03 NOTE — Telephone Encounter (Signed)
Pt notified of lab results through [place].

## 2021-04-03 NOTE — Telephone Encounter (Signed)
-----   Message from Lucilla Lame, MD sent at 04/02/2021  8:44 AM EDT ----- Please let the patient know that his blood work was normal and did not show any abnormalities to explain his anemia.  We will see what the capsule endoscopy shows when it is done. ----- Message ----- From: Interface, [facility] Lab Results In Sent: 03/28/2021   7:38 AM EDT To: Lucilla Lame, MD

## 2021-04-10 ENCOUNTER — Ambulatory Visit
Admission: RE | Admit: 2021-04-10 | Discharge: 2021-04-10 | Disposition: A | Discharge status: Home / Self Care | Payer: Medicare Other | Attending: Gastroenterology | Admitting: Gastroenterology

## 2021-04-10 ENCOUNTER — Encounter
Admission: RE | Disposition: A | Discharge status: Home / Self Care | Payer: Self-pay | Source: Home / Self Care | Attending: Gastroenterology

## 2021-04-10 DIAGNOSIS — D509 Iron deficiency anemia, unspecified: Secondary | ICD-10-CM | POA: Insufficient documentation

## 2021-04-10 HISTORY — PX: GIVENS CAPSULE STUDY: SHX5432

## 2021-04-10 SURGERY — IMAGING PROCEDURE, GI TRACT, INTRALUMINAL, VIA CAPSULE

## 2021-04-11 ENCOUNTER — Encounter: Payer: Self-pay | Admitting: Gastroenterology

## 2021-04-11 NOTE — Progress Notes (Signed)
Virtual Visit via Telephone Note   This visit type was conducted due to national recommendations for restrictions regarding the COVID-19 Pandemic (e.g. social distancing) in an effort to limit this patient's exposure and mitigate transmission in our community.  Due to his co-morbid illnesses, this patient is at least at moderate risk for complications without adequate follow up.  This format is felt to be most appropriate for this patient at this time.  The patient did not have access to video technology/had technical difficulties with video requiring transitioning to audio format only (telephone).  All issues noted in this document were discussed and addressed.  No physical exam could be performed with this format.  Please refer to the patient's chart for his  consent to telehealth for [facility].    Date:  04/11/2021   ID:  [name] Vanaman [facility] Jr., DOB [birth year], MRN [id number] The patient was identified using 2 identifiers.  Patient Location: Home Provider Location: Office/Clinic   PCP:  Sofie Hartigan, MD   [facility] Providers Cardiologist:  Sherren Mocha, MD {  Evaluation Performed:  Follow-Up Visit  Chief Complaint:  Anemia/fatigue   History of Present Illness:    [name] Brown Brooke Bonito. is a 50 y.o. male with a history of non-obstructive CAD per coronary CTA performed 06/2019 with a coronary calcium score of 62 which is 97th percentile for age and sex matched control, DM2, HTN, HLD, family history of CAD, history of tachycardia per ZIO monitor 03/2020 with a HR at 105 bpm.    Echocardiogram from 09/09/2018 with LVEF at greater than 55% with mild MR and trace TR. Myoview stress testing from 09/03/2018 with normal perfusion and no evidence of ischemia or infarct.   He previously wore a monitor that showed an average heart rate at 105 bpm therefore he was placed on beta-blocker therapy.  Due to some intolerance, Toprol was split to 75 AM/75 PM dosing.  On previous follow-up  with myself, he reported some mild DOE and fatigue therefore repeat echocardiogram was performed 12/12/20 that showed LVEF at 60-65% with no RWMA with no valvular disease. Unfortunately labs returned with a Hb at 9.2 (prior 17.2), HCT-32.7, MCV-68, MCH-19.5, and platelets at 481. He was referred by his PCP to hematology for further workup.  He was started on B12 injections and continues to follow closely with both GI and hematology.  He recently underwent a capsule study without results at this time.  He states he was told he either has an inability to absorb B12/iron versus a slowed GI bleed.  Today, he continues to have some mild fatigue.  He does report an improvement in his lab work between 02/2021 and 03/2021 mainly due to weekly iron infusions at which time he felt better however his fatigue has again returned.  Low suspicion for cardiac etiology given the above.  He continues to deny chest pain, shortness of breath, LE edema, palpitations or other anginal equivalents.  Tolerating his medications without issues.  The patient does not have symptoms concerning for COVID-19 infection (fever, chills, cough, or new shortness of breath).    Past Medical History:  Diagnosis Date   CAD (coronary artery disease)    Chronic pain    Diabetes mellitus without complication (HCC)    DJD (degenerative joint disease)    GERD (gastroesophageal reflux disease)    Headache    Hyperlipidemia    Hypertension    Hypogonadism male    Kidney stones    Past Surgical History:  Procedure Laterality Date   BICEPT TENODESIS Left 12/30/2019   Procedure: MINI BICEPS TENODESIS;  Surgeon: Corky Mull, MD;  Location: [facility] ORS;  Service: Orthopedics;  Laterality: Left;   COLONOSCOPY WITH PROPOFOL N/A 01/05/2019   Procedure: COLONOSCOPY WITH PROPOFOL;  Surgeon: Lollie Sails, MD;  Location: [facility] ENDOSCOPY;  Service: Endoscopy;  Laterality: N/A;   ESOPHAGOGASTRODUODENOSCOPY (EGD) WITH PROPOFOL N/A 01/05/2019   Procedure:  ESOPHAGOGASTRODUODENOSCOPY (EGD) WITH PROPOFOL;  Surgeon: Lollie Sails, MD;  Location: [facility] ENDOSCOPY;  Service: Endoscopy;  Laterality: N/A;   EXTRACORPOREAL SHOCK WAVE LITHOTRIPSY Right 05/14/2019   Procedure: EXTRACORPOREAL SHOCK WAVE LITHOTRIPSY (ESWL);  Surgeon: [name], MD;  Location: [facility] ORS;  Service: Urology;  Laterality: Right;   GIVENS CAPSULE STUDY N/A 04/10/2021   Procedure: GIVENS CAPSULE STUDY;  Surgeon: Lucilla Lame, MD;  Location[facility] ENDOSCOPY;  Service: Endoscopy;  Laterality: N/A;   KNEE ARTHROSCOPY     KNEE ARTHROSCOPY WITH MENISCAL REPAIR Right 01/19/2016   Procedure: KNEE ARTHROSCOPY WITH MENISCAL REPAIR;  Surgeon: Corky Mull, MD;  Location: [facility] ORS;  Service: Orthopedics;  Laterality: Right;   KNEE ARTHROSCOPY WITH MENISCAL REPAIR Right 06/12/2016   Procedure: KNEE ARTHROSCOPY WITH PARTIAL MEDIAL MENISCAL REPAIR;  Surgeon: Corky Mull, MD;  Location: [facility] ORS;  Service: Orthopedics;  Laterality: Right;   PARATHYROIDECTOMY     SHOULDER ARTHROSCOPY WITH ROTATOR CUFF REPAIR Left 12/30/2019   Procedure: SHOULDER ARTHROSCOPY WITH ROTATOR CUFF REPAIR;  Surgeon: Corky Mull, MD;  Location: [facility] ORS;  Service: Orthopedics;  Laterality: Left;   SHOULDER SURGERY Bilateral    SPINAL CORD STIMULATOR BATTERY EXCHANGE     SPINAL CORD STIMULATOR IMPLANT  2008   SPINAL FUSION     L4-L5 (2004), L5-S1 (2006)   THYROIDECTOMY, PARTIAL     TONSILLECTOMY       No outpatient medications have been marked as taking for the 04/17/21 encounter (Appointment) with Tommie Raymond, NP.     Allergies:   Pravastatin and Celexa [citalopram]   Social History   Tobacco Use   Smoking status: Never   Smokeless tobacco: Never  Vaping Use   Vaping Use: Never used  Substance Use Topics   Alcohol use: No   Drug use: No     Family Hx: The patient's family history is negative for Bladder Cancer, Kidney cancer, and Prostate cancer.  ROS:   Please see the history of present  illness.     All other systems reviewed and are negative.  Prior CV studies:   The following studies were reviewed today:  Echo 12/12/20:   1. Left ventricular ejection fraction, by estimation, is 60 to 65%. Left  ventricular ejection fraction by 3D volume is 62 %. The left ventricle has  normal function. The left ventricle has no regional wall motion  abnormalities. Left ventricular diastolic   parameters were normal.   2. Right ventricular systolic function is normal. The right ventricular  size is normal.   3. The mitral valve is grossly normal. Mild mitral valve regurgitation.  No evidence of mitral stenosis.   4. The aortic valve is tricuspid. Aortic valve regurgitation is not  visualized. No aortic stenosis is present.   5. The inferior vena cava is normal in size with greater than 50%  respiratory variability, suggesting right atrial pressure of 3 mmHg.   Labs/Other Tests and Data Reviewed:    EKG:  No ECG reviewed.  Recent Labs: 03/28/2021: ALT 40; BUN 8; Creatinine, Ser 0.96; Hemoglobin  16.5; Platelets 229; Potassium 4.2; Sodium 137   Recent Lipid Panel Lab Results  Component Value Date/Time   CHOL 132 02/12/2020 08:30 AM   TRIG 59 02/12/2020 08:30 AM   HDL 47 02/12/2020 08:30 AM   CHOLHDL 2.8 02/12/2020 08:30 AM   LDLCALC 73 02/12/2020 08:30 AM    Wt Readings from Last 3 Encounters:  03/28/21 165 lb (74.8 kg)  03/27/21 168 lb (76.2 kg)  02/28/21 170 lb 6.7 oz (77.3 kg)     Risk Assessment/Calculations:          Objective:    Vital Signs:  There were no vitals taken for this visit.   VITAL SIGNS:  reviewed GEN:  no acute distress NEURO:  alert and oriented x 3, no obvious focal deficit PSYCH:  normal affect  ASSESSMENT & PLAN:    1. Sinus tachycardia: -HR improved today however still above appropriate baseline.  Patient feels tachycardia secondary to profound anemia/iron deficiency /B12 deficiency.  Recent echocardiogram with stable LVEF -Continue  current dose of Toprol at 75am/75pm then follow hem course    2.  DOE: -Improved -Echo performed after last OV that showed EF at 60-65% with no RWMA and no valvular disease   2. Nonobstructive CAD: -Per CTA 03/2019 with 25-49% LAD, 25-49% LCx, 25-49% OM1 -Denies anginal symptoms -Continue atorvastatin -ASA currently on hold per GI while undergoing workup    3. Essential hypertension -Stable, 136/84 -Continue Toprol XL split 75 am/75 pm   4. Hyperlipidemia: -Last LDL, 73 on 02/12/2020 -Continue current regimen    5. Anemia: -Hb found to be low at 9.2 when previously WNL at 17.2 in 2020 -Recent labs with Hb stabilized, iron 121, B12 437 -Follows with hematology, GI and PCP -Currently receiving B12 injections.     COVID-19 Education: The signs and symptoms of COVID-19 were discussed with the patient and how to seek care for testing (follow up with PCP or arrange E-visit). The importance of social distancing was discussed today.  Time:   Today, I have spent 10 minutes with the patient with telehealth technology discussing the above problems.     Medication Adjustments/Labs and Tests Ordered: Current medicines are reviewed at length with the patient today.  Concerns regarding medicines are outlined above.   Tests Ordered: No orders of the defined types were placed in this encounter.   Medication Changes: No orders of the defined types were placed in this encounter.   Follow Up:  In Person in 6 month(s)  Signed, Kathyrn Drown, NP  04/11/2021 4:53 PM    [facility]

## 2021-04-17 ENCOUNTER — Telehealth (INDEPENDENT_AMBULATORY_CARE_PROVIDER_SITE_OTHER): Payer: Medicare Other | Admitting: Cardiology

## 2021-04-17 ENCOUNTER — Other Ambulatory Visit: Payer: Self-pay

## 2021-04-17 ENCOUNTER — Encounter: Payer: Self-pay | Admitting: Cardiology

## 2021-04-17 VITALS — BP 136/84 | HR 98 | Ht 66.0 in

## 2021-04-17 DIAGNOSIS — E785 Hyperlipidemia, unspecified: Secondary | ICD-10-CM

## 2021-04-17 DIAGNOSIS — R5383 Other fatigue: Secondary | ICD-10-CM

## 2021-04-17 DIAGNOSIS — I2510 Atherosclerotic heart disease of native coronary artery without angina pectoris: Secondary | ICD-10-CM

## 2021-04-17 DIAGNOSIS — E1169 Type 2 diabetes mellitus with other specified complication: Secondary | ICD-10-CM | POA: Diagnosis not present

## 2021-04-17 DIAGNOSIS — R0602 Shortness of breath: Secondary | ICD-10-CM

## 2021-04-17 DIAGNOSIS — R000 Tachycardia, unspecified: Secondary | ICD-10-CM | POA: Diagnosis not present

## 2021-04-17 NOTE — Patient Instructions (Signed)
Medication Instructions:  Your physician recommends that you continue on your current medications as directed. Please refer to the Current Medication list given to you today.  *If you need a refill on your cardiac medications before your next appointment, please call your pharmacy*   Lab Work: NONE If you have labs (blood work) drawn today and your tests are completely normal, you will receive your results only by: [place] (if you have MyChart) OR A paper copy in the mail If you have any lab test that is abnormal or we need to change your treatment, we will call you to review the results.   Testing/Procedures: NONE   Follow-Up: At [facility], you and your health needs are our priority.  As part of our continuing mission to provide you with exceptional heart care, we have created designated Provider Care Teams.  These Care Teams include your primary Cardiologist (physician) and Advanced Practice Providers (APPs -  Physician Assistants and Nurse Practitioners) who all work together to provide you with the care you need, when you need it.  We recommend signing up for the patient portal called "MyChart".  Sign up information is provided on this After Visit Summary.  MyChart is used to connect with patients for Virtual Visits (Telemedicine).  Patients are able to view lab/test results, encounter notes, upcoming appointments, etc.  Non-urgent messages can be sent to your provider as well.   To learn more about what you can do with MyChart, go to [email or web address].    Your next appointment:   6 month(s)  The format for your next appointment:   In Person  Provider:   You may see Sherren Mocha, MD or one of the following Advanced Practice Providers on your designated Care Team:   Richardson Dopp, PA-C [place], [place]

## 2021-04-19 DIAGNOSIS — R21 Rash and other nonspecific skin eruption: Secondary | ICD-10-CM | POA: Diagnosis not present

## 2021-04-25 ENCOUNTER — Ambulatory Visit: Payer: 59 | Admitting: Nurse Practitioner

## 2021-04-25 ENCOUNTER — Other Ambulatory Visit: Payer: 59

## 2021-04-26 ENCOUNTER — Other Ambulatory Visit: Payer: 59

## 2021-04-26 ENCOUNTER — Ambulatory Visit: Payer: 59 | Admitting: Oncology

## 2021-05-02 ENCOUNTER — Ambulatory Visit: Payer: Self-pay | Admitting: Urology

## 2021-05-08 DIAGNOSIS — G43719 Chronic migraine without aura, intractable, without status migrainosus: Secondary | ICD-10-CM | POA: Diagnosis not present

## 2021-05-09 ENCOUNTER — Ambulatory Visit (INDEPENDENT_AMBULATORY_CARE_PROVIDER_SITE_OTHER): Payer: Medicare Other | Admitting: Urology

## 2021-05-09 ENCOUNTER — Inpatient Hospital Stay (HOSPITAL_BASED_OUTPATIENT_CLINIC_OR_DEPARTMENT_OTHER): Payer: Medicare Other | Admitting: Internal Medicine

## 2021-05-09 ENCOUNTER — Ambulatory Visit
Admission: RE | Admit: 2021-05-09 | Discharge: 2021-05-09 | Disposition: A | Discharge status: Home / Self Care | Payer: Medicare Other | Source: Home / Self Care | Attending: Urology | Admitting: Urology

## 2021-05-09 ENCOUNTER — Encounter: Payer: Self-pay | Admitting: Urology

## 2021-05-09 ENCOUNTER — Encounter: Payer: Self-pay | Admitting: Internal Medicine

## 2021-05-09 ENCOUNTER — Ambulatory Visit
Admission: RE | Admit: 2021-05-09 | Discharge: 2021-05-09 | Disposition: A | Discharge status: Home / Self Care | Payer: Medicare Other | Source: Ambulatory Visit | Attending: Urology | Admitting: Urology

## 2021-05-09 ENCOUNTER — Other Ambulatory Visit: Payer: Self-pay

## 2021-05-09 ENCOUNTER — Other Ambulatory Visit: Admission: RE | Admit: 2021-05-09 | Payer: Medicare Other | Source: Home / Self Care

## 2021-05-09 ENCOUNTER — Inpatient Hospital Stay: Payer: Medicare Other | Attending: Internal Medicine

## 2021-05-09 VITALS — BP 137/88 | HR 97 | Ht 66.0 in | Wt 165.0 lb

## 2021-05-09 DIAGNOSIS — E291 Testicular hypofunction: Secondary | ICD-10-CM | POA: Insufficient documentation

## 2021-05-09 DIAGNOSIS — M549 Dorsalgia, unspecified: Secondary | ICD-10-CM | POA: Insufficient documentation

## 2021-05-09 DIAGNOSIS — R002 Palpitations: Secondary | ICD-10-CM | POA: Diagnosis not present

## 2021-05-09 DIAGNOSIS — L918 Other hypertrophic disorders of the skin: Secondary | ICD-10-CM

## 2021-05-09 DIAGNOSIS — E538 Deficiency of other specified B group vitamins: Secondary | ICD-10-CM

## 2021-05-09 DIAGNOSIS — D510 Vitamin B12 deficiency anemia due to intrinsic factor deficiency: Secondary | ICD-10-CM | POA: Insufficient documentation

## 2021-05-09 DIAGNOSIS — H04129 Dry eye syndrome of unspecified lacrimal gland: Secondary | ICD-10-CM | POA: Insufficient documentation

## 2021-05-09 DIAGNOSIS — E611 Iron deficiency: Secondary | ICD-10-CM

## 2021-05-09 DIAGNOSIS — M1990 Unspecified osteoarthritis, unspecified site: Secondary | ICD-10-CM | POA: Insufficient documentation

## 2021-05-09 DIAGNOSIS — G8929 Other chronic pain: Secondary | ICD-10-CM | POA: Insufficient documentation

## 2021-05-09 DIAGNOSIS — E785 Hyperlipidemia, unspecified: Secondary | ICD-10-CM | POA: Diagnosis not present

## 2021-05-09 DIAGNOSIS — D509 Iron deficiency anemia, unspecified: Secondary | ICD-10-CM

## 2021-05-09 DIAGNOSIS — N200 Calculus of kidney: Secondary | ICD-10-CM

## 2021-05-09 DIAGNOSIS — R682 Dry mouth, unspecified: Secondary | ICD-10-CM | POA: Insufficient documentation

## 2021-05-09 DIAGNOSIS — R519 Headache, unspecified: Secondary | ICD-10-CM | POA: Insufficient documentation

## 2021-05-09 DIAGNOSIS — Z79899 Other long term (current) drug therapy: Secondary | ICD-10-CM | POA: Insufficient documentation

## 2021-05-09 DIAGNOSIS — E119 Type 2 diabetes mellitus without complications: Secondary | ICD-10-CM | POA: Diagnosis not present

## 2021-05-09 LAB — CBC WITH DIFFERENTIAL/PLATELET
Abs Immature Granulocytes: 0.04 10*3/uL (ref 0.00–0.07)
Basophils Absolute: 0 10*3/uL (ref 0.0–0.1)
Basophils Relative: 1 %
Eosinophils Absolute: 0 10*3/uL (ref 0.0–0.5)
Eosinophils Relative: 1 %
HCT: 46.2 % (ref 39.0–52.0)
Hemoglobin: 16.2 g/dL (ref 13.0–17.0)
Immature Granulocytes: 1 %
Lymphocytes Relative: 26 %
Lymphs Abs: 1.7 10*3/uL (ref 0.7–4.0)
MCH: 30.2 pg (ref 26.0–34.0)
MCHC: 35.1 g/dL (ref 30.0–36.0)
MCV: 86 fL (ref 80.0–100.0)
Monocytes Absolute: 0.6 10*3/uL (ref 0.1–1.0)
Monocytes Relative: 9 %
Neutro Abs: 4.3 10*3/uL (ref 1.7–7.7)
Neutrophils Relative %: 62 %
Platelets: 269 10*3/uL (ref 150–400)
RBC: 5.37 MIL/uL (ref 4.22–5.81)
RDW: 17.9 % — ABNORMAL HIGH (ref 11.5–15.5)
WBC: 6.6 10*3/uL (ref 4.0–10.5)
nRBC: 0 % (ref 0.0–0.2)

## 2021-05-09 LAB — IRON AND TIBC
Iron: 129 ug/dL (ref 45–182)
Saturation Ratios: 41 % — ABNORMAL HIGH (ref 17.9–39.5)
TIBC: 316 ug/dL (ref 250–450)
UIBC: 187 ug/dL

## 2021-05-09 LAB — FERRITIN: Ferritin: 94 ng/mL (ref 24–336)

## 2021-05-09 LAB — VITAMIN B12: Vitamin B-12: 2290 pg/mL — ABNORMAL HIGH (ref 180–914)

## 2021-05-09 LAB — FOLATE: Folate: 14.8 ng/mL (ref >5.9)

## 2021-05-09 NOTE — Progress Notes (Signed)
   05/09/2021 1:35 PM   [place]. Mar 08, 1971 [id number]  Reason for visit: Follow up nephrolithiasis, new genital lesions  HPI: 50 year old male who I followed for recurrent calcium oxalate nephrolithiasis.  Prior 24-hour urine showed low volume and mildly elevated urine sodium.  He has been taking potassiums citrate formulation that he makes at home.  He has not had any stones in the last 6-8 months.  We will get KUB today for surveillance, and call with results.  He also reports some new genital lesions over the last year.  These have been itchy and irritating, and he is interested in having them removed.  On exam he has a total of 4 small skin tags measuring about 4 mm each in the inguinal folds bilaterally.  Informed consent was obtained and cryo freeze was used on all 4 lesions.  Return precautions discussed.  Call with KUB results from today Continue yearly follow-up for stone surveillance  [name], MD  [place] [address], [place], [place] [address] [phone]

## 2021-05-09 NOTE — Progress Notes (Signed)
[place] NOTE  Patient Care Team: Sofie Hartigan, MD as PCP - General (Family Medicine) Sherren Mocha, MD as PCP - Cardiology (Cardiology) Lequita Asal, MD as Referring Physician (Hematology and Oncology)  CHIEF COMPLAINTS/PURPOSE OF CONSULTATION:    #Pernicious anemia [name] Brown 2022-palpitations cardiology Dr. Burt Knack work-up]-iron deficiency  B12deficiency; intrinsic factor antibodies- s/p B12- IV venofer; GI  #Hypogonadism-testosterone supplementation/partial thyroidectomy [benign "tumor"]-Dr. Honor Junes  #Chronic back pain/pain clinic-OxyContin; stimulator   Oncology History   No history exists.     HISTORY OF PRESENTING ILLNESS:  [name] Brown [place]. 50 y.o.  male with above history of B12 deficiency/iron deficiency secondary to pernicious anemia due for follow-up.  Patient states that since starting B12 IV iron infusion he felt better in terms of symptoms of fatigue and palpitations.  However in the last few months he started to notice it getting worse again.   Complains of fatigue.  Complains of palpitations.  He feels that his "symptoms" coming back.   Review of Systems  Constitutional:  Positive for malaise/fatigue. Negative for chills, diaphoresis, fever and weight loss.  HENT:  Negative for nosebleeds and sore throat.   Eyes:  Negative for double vision.  Respiratory:  Negative for cough, hemoptysis, sputum production, shortness of breath and wheezing.   Cardiovascular:  Positive for palpitations. Negative for chest pain, orthopnea and leg swelling.  Gastrointestinal:  Negative for abdominal pain, blood in stool, constipation, diarrhea, heartburn, melena, nausea and vomiting.  Genitourinary:  Negative for dysuria, frequency and urgency.  Musculoskeletal:  Positive for back pain.  Skin: Negative.  Negative for itching and rash.  Neurological:  Positive for headaches. Negative for tingling, focal weakness and weakness.   Endo/Heme/Allergies:  Does not bruise/bleed easily.  Psychiatric/Behavioral:  Negative for depression. The patient is not nervous/anxious and does not have insomnia.     MEDICAL HISTORY:  Past Medical History:  Diagnosis Date   CAD (coronary artery disease)    Chronic pain    Diabetes mellitus without complication (HCC)    DJD (degenerative joint disease)    GERD (gastroesophageal reflux disease)    Headache    Hyperlipidemia    Hypertension    Hypogonadism male    Kidney stones     SURGICAL HISTORY: Past Surgical History:  Procedure Laterality Date   BICEPT TENODESIS Left 12/30/2019   Procedure: MINI BICEPS TENODESIS;  Surgeon: Corky Mull, MD;  Location: [facility] ORS;  Service: Orthopedics;  Laterality: Left;   COLONOSCOPY WITH PROPOFOL N/A 01/05/2019   Procedure: COLONOSCOPY WITH PROPOFOL;  Surgeon: Lollie Sails, MD;  Location: [facility] ENDOSCOPY;  Service: Endoscopy;  Laterality: N/A;   ESOPHAGOGASTRODUODENOSCOPY (EGD) WITH PROPOFOL N/A 01/05/2019   Procedure: ESOPHAGOGASTRODUODENOSCOPY (EGD) WITH PROPOFOL;  Surgeon: Lollie Sails, MD;  Location: [facility] ENDOSCOPY;  Service: Endoscopy;  Laterality: N/A;   EXTRACORPOREAL SHOCK WAVE LITHOTRIPSY Right 05/14/2019   Procedure: EXTRACORPOREAL SHOCK WAVE LITHOTRIPSY (ESWL);  Surgeon: [name], MD;  Location: [facility] ORS;  Service: Urology;  Laterality: Right;   GIVENS CAPSULE STUDY N/A 04/10/2021   Procedure: GIVENS CAPSULE STUDY;  Surgeon: Lucilla Lame, MD;  Location: [facility] ENDOSCOPY;  Service: Endoscopy;  Laterality: N/A;   KNEE ARTHROSCOPY     KNEE ARTHROSCOPY WITH MENISCAL REPAIR Right 01/19/2016   Procedure: KNEE ARTHROSCOPY WITH MENISCAL REPAIR;  Surgeon: Corky Mull, MD;  Location: [facility] ORS;  Service: Orthopedics;  Laterality: Right;   KNEE ARTHROSCOPY WITH MENISCAL REPAIR Right 06/12/2016   Procedure: KNEE ARTHROSCOPY WITH PARTIAL MEDIAL  MENISCAL REPAIR;  Surgeon: Corky Mull, MD;  Location: [facility] ORS;  Service: Orthopedics;   Laterality: Right;   PARATHYROIDECTOMY     SHOULDER ARTHROSCOPY WITH ROTATOR CUFF REPAIR Left 12/30/2019   Procedure: SHOULDER ARTHROSCOPY WITH ROTATOR CUFF REPAIR;  Surgeon: Corky Mull, MD;  Location: [facility] ORS;  Service: Orthopedics;  Laterality: Left;   SHOULDER SURGERY Bilateral    SPINAL CORD STIMULATOR BATTERY EXCHANGE     SPINAL CORD STIMULATOR IMPLANT  2008   SPINAL FUSION     L4-L5 (2004), L5-S1 (2006)   THYROIDECTOMY, PARTIAL     TONSILLECTOMY      SOCIAL HISTORY: Social History   Socioeconomic History   Marital status: Married    Spouse name: Not on file   Number of children: Not on file   Years of education: Not on file   Highest education level: Not on file  Occupational History   Not on file  Tobacco Use   Smoking status: Never   Smokeless tobacco: Never  Vaping Use   Vaping Use: Never used  Substance and Sexual Activity   Alcohol use: No   Drug use: No   Sexual activity: Yes    Birth control/protection: None  Other Topics Concern   Not on file  Social History Narrative   Not on file   Social Determinants of Health   Financial Resource Strain: Not on file  Food Insecurity: Not on file  Transportation Needs: Not on file  Physical Activity: Not on file  Stress: Not on file  Social Connections: Not on file  Intimate Partner Violence: Not on file    FAMILY HISTORY: Family History  Problem Relation Age of Onset   Bladder Cancer Neg Hx    Kidney cancer Neg Hx    Prostate cancer Neg Hx     ALLERGIES:  is allergic to pravastatin and celexa [citalopram].  MEDICATIONS:  Current Outpatient Medications  Medication Sig Dispense Refill   ACCU-CHEK GUIDE test strip      Accu-Chek Softclix Lancets lancets SMARTSIG:Topical     acyclovir (ZOVIRAX) 200 MG capsule Take 200 mg by mouth 2 (two) times daily as needed (fever blisters).      atorvastatin (LIPITOR) 40 MG tablet Take 1 tablet (40 mg total) by mouth daily. 90 tablet 3   Blood Glucose Monitoring  Suppl (ACCU-CHEK GUIDE) w/Device KIT      cholecalciferol (VITAMIN D) 1000 units tablet Take 1,000 Units by mouth daily.     cyanocobalamin (,VITAMIN B-12,) 1000 MCG/ML injection Inject into the muscle.     metFORMIN (GLUCOPHAGE-XR) 500 MG 24 hr tablet Take 1,000 mg by mouth 2 (two) times daily.     METOPROLOL SUCCINATE ER PO Take 75 mg by mouth in the morning and at bedtime.     nortriptyline (PAMELOR) 50 MG capsule Take 50 mg by mouth at bedtime.      OXYCONTIN 10 MG 12 hr tablet Take 10 mg by mouth in the morning and at bedtime.     pantoprazole (PROTONIX) 40 MG tablet Take 40 mg by mouth daily.     SYRINGE-NEEDLE, DISP, 3 ML (MONOJECT 3CC SYR 23GX1") 23G X 1" 3 ML MISC Use 1 Syringe as directed     testosterone cypionate (DEPOTESTOSTERONE CYPIONATE) 200 MG/ML injection Inject 200 mg into the muscle every 7 (seven) days.     traZODone (DESYREL) 50 MG tablet Take 25 mg by mouth at bedtime as needed for sleep.      EPINEPHrine 0.3 mg/0.3  mL IJ SOAJ injection Inject 0.3 mg into the muscle as needed for anaphylaxis. (Patient not taking: Reported on 05/09/2021)     hydrOXYzine (ATARAX/VISTARIL) 10 MG tablet Take 10 mg by mouth 3 (three) times daily as needed for itching. (Patient not taking: Reported on 05/09/2021)     SUMAtriptan (IMITREX) 100 MG tablet Take 100 mg by mouth every 2 (two) hours as needed for migraine.  (Patient not taking: Reported on 05/09/2021)     tamsulosin (FLOMAX) 0.4 MG CAPS capsule Take 1 capsule (0.4 mg total) by mouth daily. (Patient not taking: Reported on 05/09/2021) 30 capsule 3   No current facility-administered medications for this visit.      Marland Kitchen  PHYSICAL EXAMINATION: ECOG PERFORMANCE STATUS: 1 - Symptomatic but completely ambulatory  Vitals:   05/09/21 1135  BP: (!) 137/93  Pulse: (!) 105  Resp: 20  Temp: 98.3 F (36.8 C)   Filed Weights   05/09/21 1135  Weight: 165 lb (74.8 kg)    Physical Exam Vitals and nursing note reviewed.  Constitutional:       Comments: Alone. Ambulating independently       HENT:     Head: Normocephalic and atraumatic.     Mouth/Throat:     Pharynx: Oropharynx is clear.  Eyes:     Extraocular Movements: Extraocular movements intact.     Pupils: Pupils are equal, round, and reactive to light.  Cardiovascular:     Rate and Rhythm: Normal rate and regular rhythm.  Pulmonary:     Comments: Decreased breath sounds bilaterally.  Abdominal:     Palpations: Abdomen is soft.  Musculoskeletal:        General: Normal range of motion.     Cervical back: Normal range of motion.  Skin:    General: Skin is warm.  Neurological:     General: No focal deficit present.     Mental Status: He is alert and oriented to person, place, and time.  Psychiatric:        Behavior: Behavior normal.        Judgment: Judgment normal.     LABORATORY DATA:  I have reviewed the data as listed Lab Results  Component Value Date   WBC 6.6 05/09/2021   HGB 16.2 05/09/2021   HCT 46.2 05/09/2021   MCV 86.0 05/09/2021   PLT 269 05/09/2021   Recent Labs    12/07/20 0940 03/28/21 1133  NA 136 137  K 4.9 4.2  CL 97 98  CO2 22 27  GLUCOSE 216* 170*  BUN 12 8  CREATININE 1.06 0.96  CALCIUM 9.5 9.4  GFRNONAA 81 >60  GFRAA 94  --   PROT  --  7.3  ALBUMIN  --  4.4  AST  --  31  ALT  --  40  ALKPHOS  --  52  BILITOT  --  0.3    RADIOGRAPHIC STUDIES: I have personally reviewed the radiological images as listed and agreed with the findings in the report. No results found.  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  If low would recommend B12 injections/iron infusions; will decide on frequency.   #Discussed at length the autoimmune etiology of patient's  anemia at length; also a possible thyroid disease related to his autoimmune disease.  TSH April 2022  was normal.  # Hypognoadism: Testosterone IMq 7 day [Dr.O'Connell]  # Dry eyes/dry mouth /palpitations- ?  Nortriptyline [[facility]]  #Chronic back pain-pain clinic [OxyContin/spine stimulator]-STABLE,  # DISPOSITION: # follow up TBD- Dr.B    All questions were answered. The patient knows to call the clinic with any problems, questions or concerns.       Cammie Sickle, MD 05/09/2021 1:05 PM

## 2021-05-09 NOTE — Assessment & Plan Note (Addendum)
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  If low would recommend B12 injections/iron infusions; will decide on frequency.  #Discussed at length the autoimmune etiology of patient's anemia at length; also a possible thyroid disease related to his autoimmune disease.  TSH April 2022 was normal.  # Hypognoadism: Testosterone IMq 7 day [Dr.O'Connell]  # Dry eyes/dry mouth /palpitations- ?  Nortriptyline [[facility]]  #Chronic back pain-pain clinic [OxyContin/spine stimulator]-STABLE,  # DISPOSITION: # follow up TBD- Dr.B

## 2021-05-15 ENCOUNTER — Telehealth: Payer: Self-pay | Admitting: Internal Medicine

## 2021-05-15 ENCOUNTER — Encounter: Payer: Self-pay | Admitting: Internal Medicine

## 2021-05-15 DIAGNOSIS — E538 Deficiency of other specified B group vitamins: Secondary | ICD-10-CM

## 2021-05-15 DIAGNOSIS — E611 Iron deficiency: Secondary | ICD-10-CM

## 2021-05-15 DIAGNOSIS — D509 Iron deficiency anemia, unspecified: Secondary | ICD-10-CM

## 2021-05-15 DIAGNOSIS — D510 Vitamin B12 deficiency anemia due to intrinsic factor deficiency: Secondary | ICD-10-CM

## 2021-05-15 NOTE — Telephone Encounter (Signed)
On 7/25- spoke to pt- re: labs- recommend B12 injection q every other month [at home].  Iron studies adequate- no iron infusions.   Recommend follow-up in 6 months-MD; possible Venofer/B12 . 1 week prior labs CBC CMP; iron studies ferritin; B12.   Non-specific multiple symptoms-fever blisters  [On acyclovir]; fatigue; palpitations/dry eyes-mouth "feels terrible".  H/T-please reach out to Dr. Nathanial Millman; leave a message/call me to discuss regarding this patient. Thanks  GB

## 2021-05-16 ENCOUNTER — Telehealth: Payer: Self-pay

## 2021-05-16 NOTE — Addendum Note (Signed)
Addended by: Gloris Ham on: 05/16/2021 08:32 AM   Modules accepted: Orders

## 2021-05-16 NOTE — Telephone Encounter (Signed)
Reached out to Dr. Dorathy Kinsman office due to Dr. B wanting to speak to provider about mutual pt. Spoke to [place] and replayed message, left Dr. B cell phone number in order for call to be returned.

## 2021-05-16 NOTE — Telephone Encounter (Signed)
[name] Brown - please call Dr. Jorene Minors office and left msg for md to call Dr. B at [phone]

## 2021-05-22 IMAGING — RF DG FLUORO GUIDE NDL PLC/BX
1 series · 1 of 1 positions shown · non-contrast
Comparison: none

CLINICAL DATA: Pt reports pinching sensation to superior left
shoulder x 1 yr. Pt states pain radiates to back of left shoulder.

[Series 2: cp_standard · 0.27mm/px · 1 of 1 slices shown]
[im 1/1]
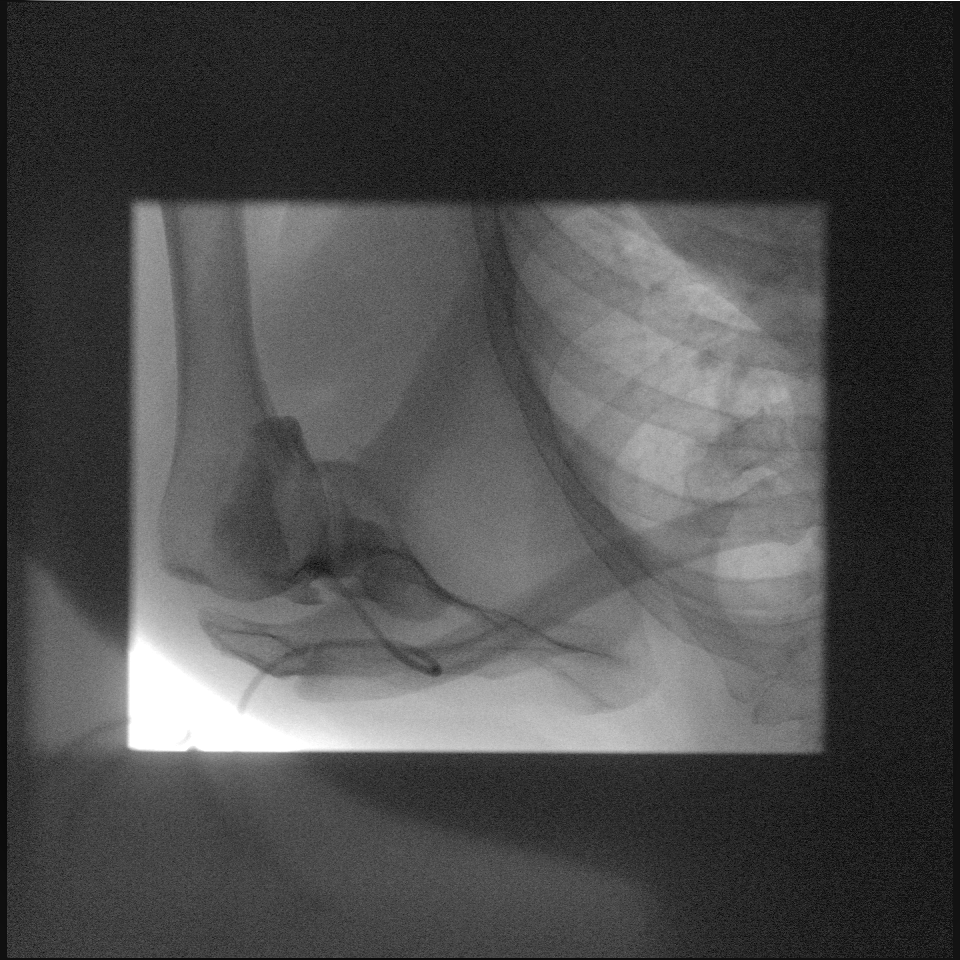

[1 of 1 positions shown; findings below may reference images not displayed]

EXAM:
LEFT SHOULDER ARTHROGRAM UNDER FLUOROSCOPY

FLUOROSCOPY TIME:  Fluoroscopy Time: 0.2 min

Radiation Exposure Index (if provided by the fluoroscopic device):
0.4 mGy

Number of Acquired Spot Images: 0

PROCEDURE:
The risks and benefits of the procedure were discussed with the
patient, and written informed consent was obtained. The patient
stated no history of allergy to contrast media. A formal timeout
procedure was performed with the patient according to departmental
protocol.

The patient was placed supine on the fluoroscopy table and the left
glenohumeral joint was identified under fluoroscopy. The skin
overlying the left glenohumeral joint was subsequently cleaned with
Chloraprep and a sterile drape was placed over the area of interest.
5 ml 1% Lidocaine was used to anesthetize the skin around the needle
insertion site.

A 22 gauge spinal needle was inserted into the left glenohumeral
joint under fluoroscopy. Position was confirmed with injection of
less than 1ml of Omnipaque 180 under fluoroscopy.

12 ml of iodinated mixture (13 mL Omnipaque 180 with 7 mL sterile
saline was injected into the left glenohumeral joint.

The needle was removed and hemostasis was achieved. The patient was
subsequently transferred to CT for imaging.
IMPRESSION: Technically successful left shoulder arthrogram prior to CT under
fluoroscopy.

## 2021-05-22 IMAGING — CT CT SHOULDER*L* W/CM
1 series · 12 of 14 positions shown, 15 images · non-contrast
Comparison: None.

CLINICAL DATA: Pinching sensation in the superior aspect of the
left shoulder with pain radiating into the back of the shoulder for
1 year. No known injury.

EXAM:
CT ARTHROGRAPHY OF THE LEFT SHOULDER
TECHNIQUE: Multidetector CT imaging was performed following the standard
protocol after injection of dilute contrast into the joint.

[Series 7: ax st · axial · 0.37mm/px · z∈[-559,-378]mm · 12 of 111 slices shown, 15 images]
[im 9/111  soft-tissue]
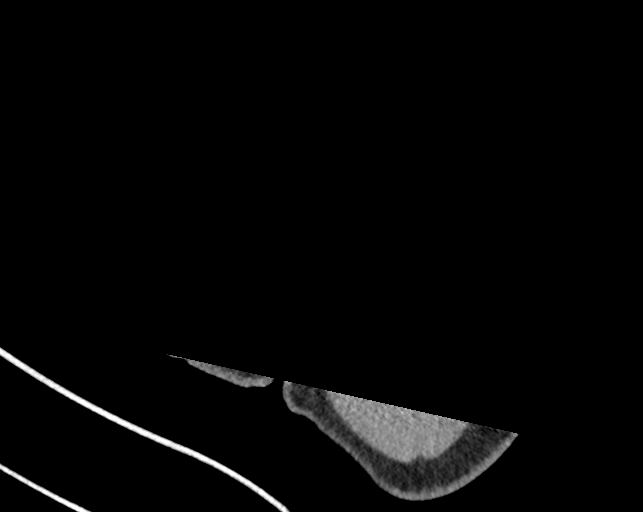
[im 9/111  bone]
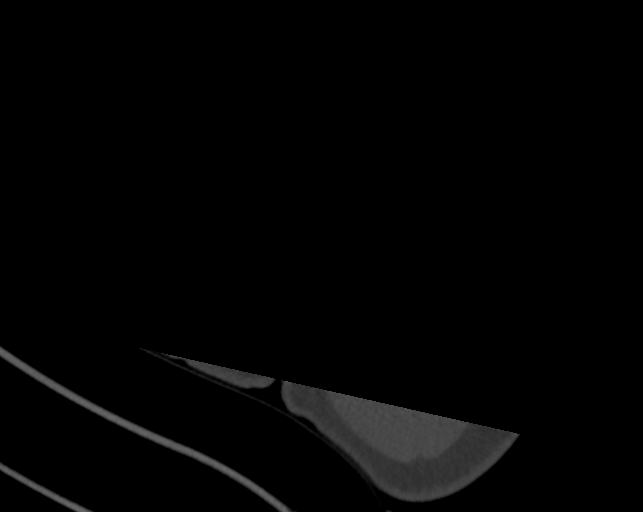
[im 17/111  bone]
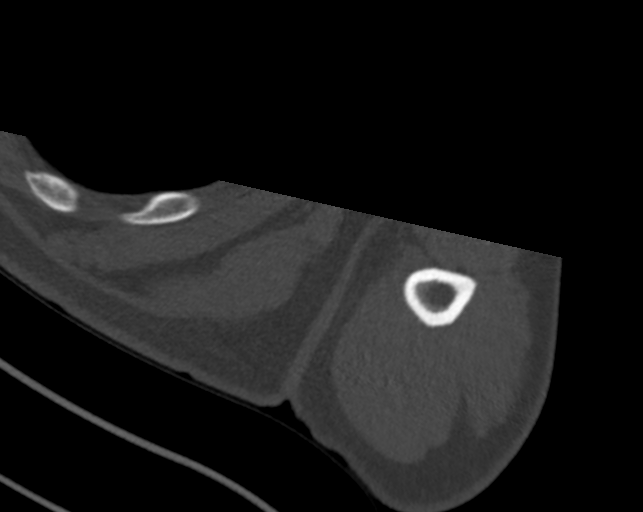
[im 26/111  bone]
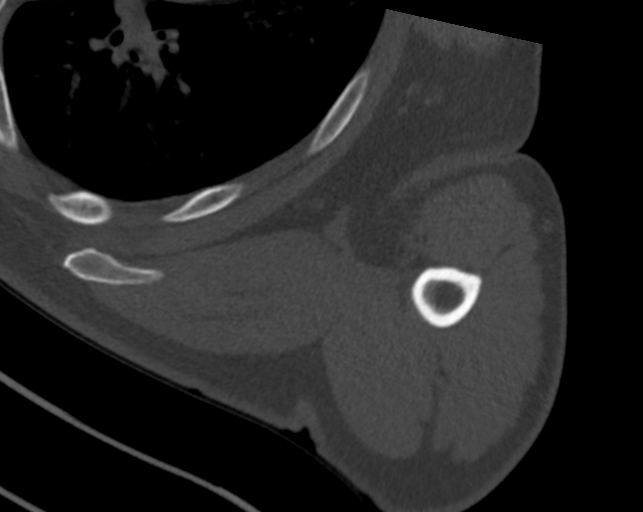
[im 34/111  bone]
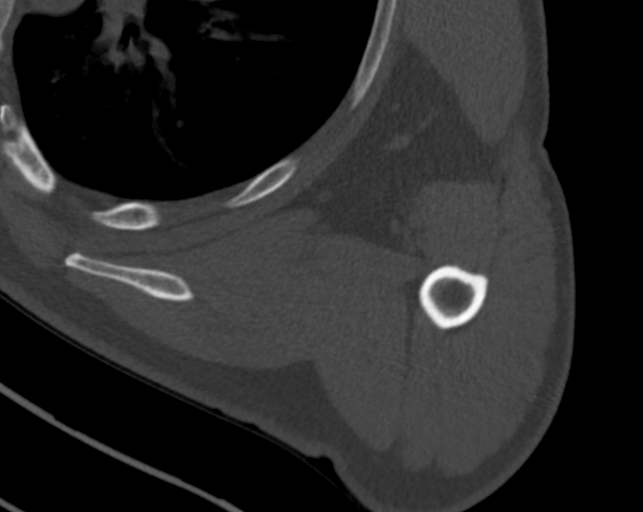
[im 43/111  soft-tissue]
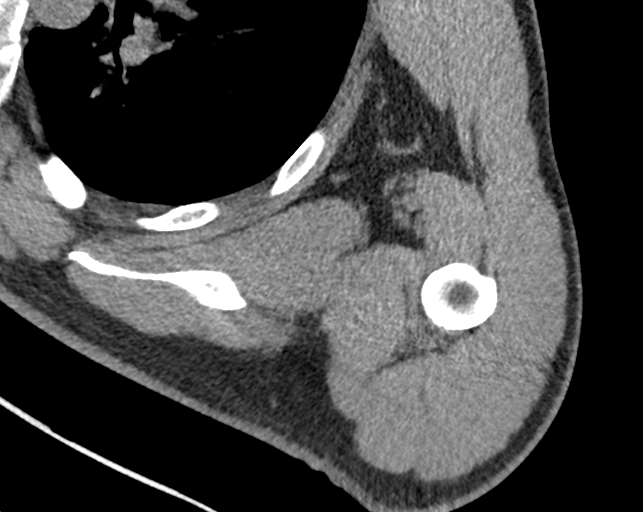
[im 43/111  bone]
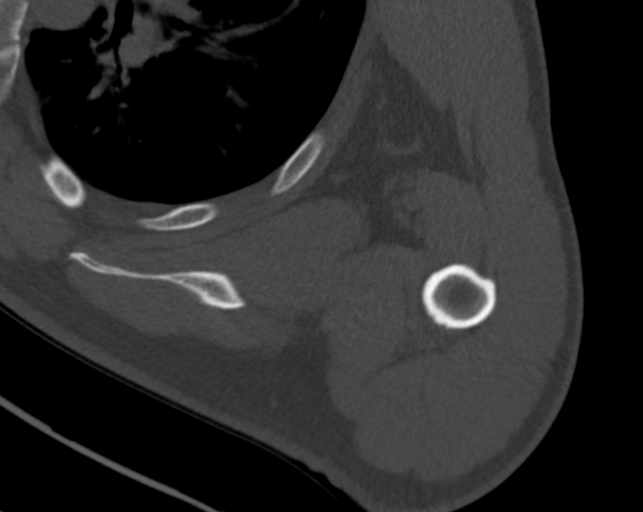
[im 51/111  bone]
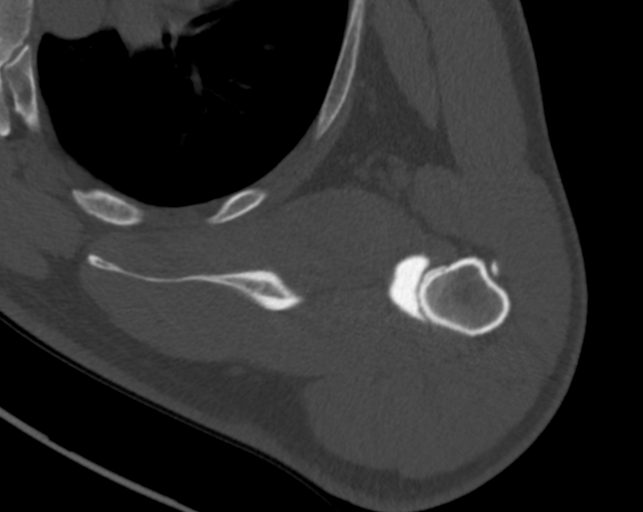
[im 60/111  bone]
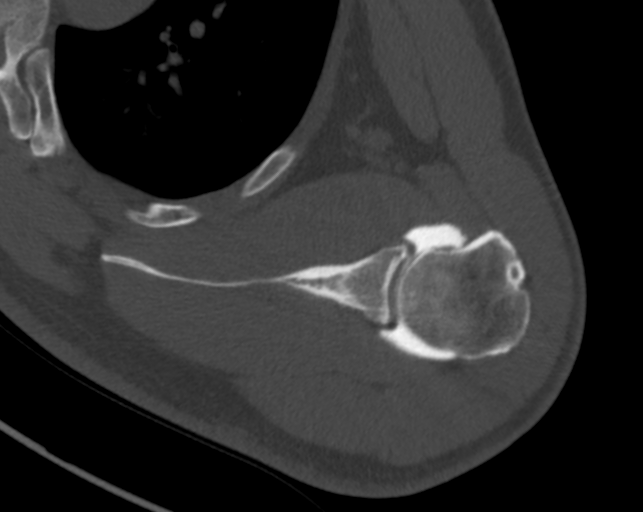
[im 68/111  bone]
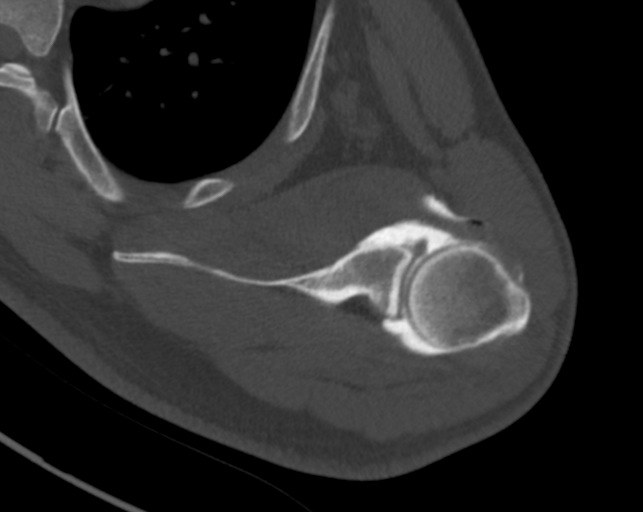
[im 77/111  soft-tissue]
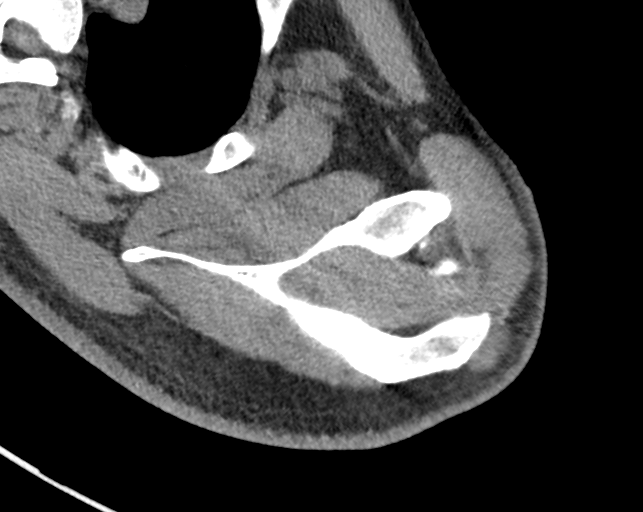
[im 77/111  bone]
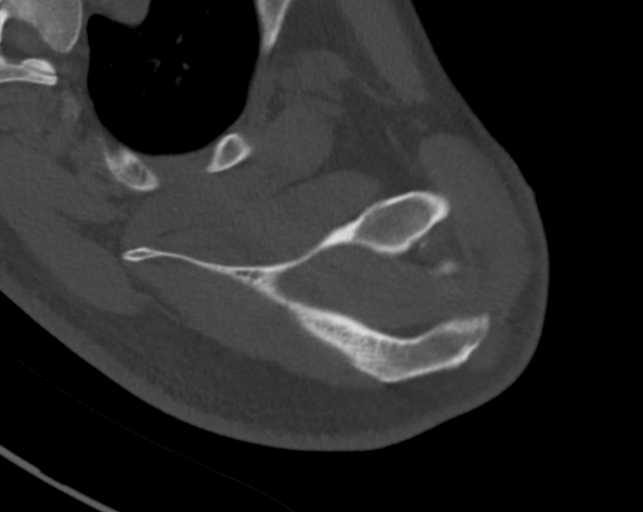
[im 85/111  bone]
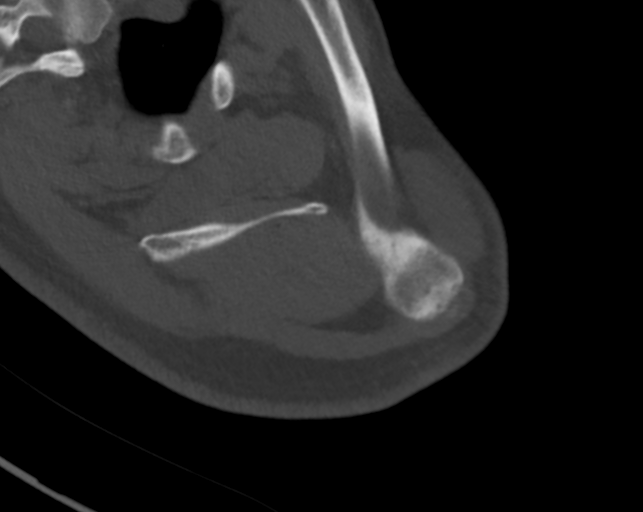
[im 94/111  bone]
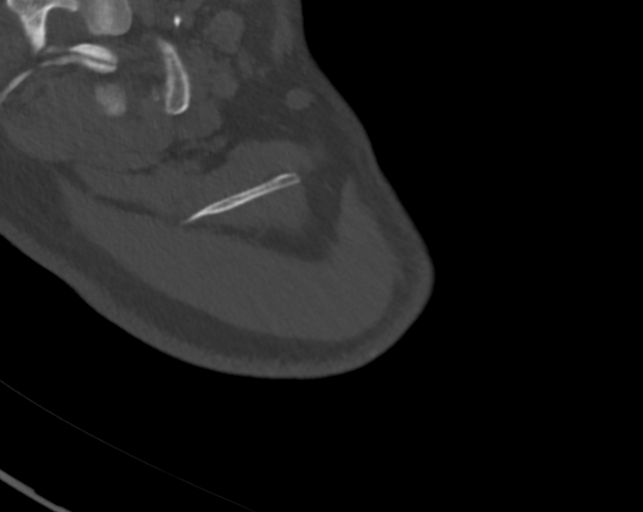
[im 102/111  bone]
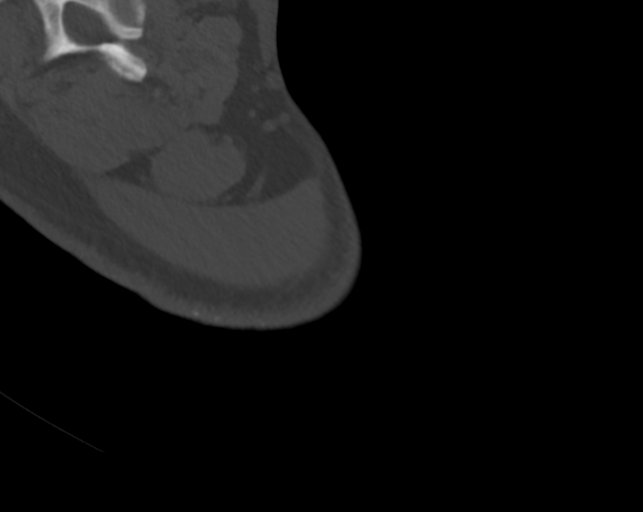

[12 of 14 positions shown; findings below may reference images not displayed]

FINDINGS: Rotator cuff: Intact. Trace amount of contrast in the posterior
aspect of the infraspinatus is consistent with a small fissure.
There is no contrast in the subacromial/subdeltoid bursa.

Muscles:  Normal without atrophy or focal lesion.

Biceps long head:  Intact.

Acromioclavicular Joint: Moderate osteoarthritis. Type 1 acromion.
No subacromial/subdeltoid bursal fluid.

Glenohumeral Joint: Appears normal.

Labrum:  Intact.  Tiny sublabral foramen incidentally noted.

Bones:  No fracture or worrisome lesion.

Other: Imaged lung parenchyma is clear.
IMPRESSION: Findings compatible with rotator cuff tendinopathy with a very small
fissure in the posterior infraspinatus. Negative for tear.

Moderate acromioclavicular osteoarthritis.

## 2021-05-25 DIAGNOSIS — G894 Chronic pain syndrome: Secondary | ICD-10-CM | POA: Diagnosis not present

## 2021-05-25 DIAGNOSIS — M25559 Pain in unspecified hip: Secondary | ICD-10-CM | POA: Diagnosis not present

## 2021-05-25 DIAGNOSIS — M47814 Spondylosis without myelopathy or radiculopathy, thoracic region: Secondary | ICD-10-CM | POA: Diagnosis not present

## 2021-05-25 DIAGNOSIS — M533 Sacrococcygeal disorders, not elsewhere classified: Secondary | ICD-10-CM | POA: Diagnosis not present

## 2021-05-25 DIAGNOSIS — M5412 Radiculopathy, cervical region: Secondary | ICD-10-CM | POA: Diagnosis not present

## 2021-05-25 DIAGNOSIS — M791 Myalgia, unspecified site: Secondary | ICD-10-CM | POA: Diagnosis not present

## 2021-05-25 DIAGNOSIS — Z79899 Other long term (current) drug therapy: Secondary | ICD-10-CM | POA: Diagnosis not present

## 2021-06-01 ENCOUNTER — Telehealth: Payer: Self-pay | Admitting: Urology

## 2021-06-01 NOTE — Telephone Encounter (Signed)
Pt called office to cancel appt for 8/15.  He never received his 24 hr urine kit.  [place] will request for another kit to be sent to him and pt will call when it arrives to r/s lab appt.

## 2021-06-05 ENCOUNTER — Other Ambulatory Visit: Payer: Medicare Other

## 2021-06-14 NOTE — Telephone Encounter (Signed)
Minimal tiny stones in the bottom of the left kidney have been stable over the last year so I would say the litholyte is working well.  Regarding the skin tags, the neck step would be to physically remove them, this could be done in clinic with some local numbing medication and would be pretty minor, but would get rid of them for sure.  Okay to book for 30-minute clinic procedure for skin tag removal if he desires.  He could also get a second opinion from dermatology, but these look like benign skin tags to me.  Nickolas Madrid, MD 06/14/2021

## 2021-06-20 ENCOUNTER — Ambulatory Visit: Payer: Medicare Other | Admitting: Urology

## 2021-06-29 ENCOUNTER — Other Ambulatory Visit: Payer: Self-pay

## 2021-06-29 ENCOUNTER — Ambulatory Visit (INDEPENDENT_AMBULATORY_CARE_PROVIDER_SITE_OTHER): Payer: Medicare Other | Admitting: Gastroenterology

## 2021-06-29 ENCOUNTER — Encounter: Payer: Self-pay | Admitting: Gastroenterology

## 2021-06-29 VITALS — BP 129/95 | HR 120 | Temp 98.3°F | Ht 66.0 in | Wt 158.5 lb

## 2021-06-29 DIAGNOSIS — R197 Diarrhea, unspecified: Secondary | ICD-10-CM | POA: Diagnosis not present

## 2021-06-29 DIAGNOSIS — I2510 Atherosclerotic heart disease of native coronary artery without angina pectoris: Secondary | ICD-10-CM | POA: Diagnosis not present

## 2021-06-29 NOTE — Progress Notes (Signed)
Primary Care Physician: Marina Goodell, MD  Primary Gastroenterologist:  Dr. Midge Minium  Chief Complaint  Patient presents with  . Anemia    HPI: [name] Brown [name] Brown. is a 50 y.o. male here who is here for follow-up.  The patient states that he is confused and understandably so. He reports that he was sent to me by hematology after he was found to have low iron and the diagnosis of pernicious anemia.  He reports that he took B12 and then his B12 levels exceeded 2000.  He was then found to have his anemia corrected with a colonoscopy and EGD done by Dr. Marva Panda in the past suggesting celiac sprue without biopsy evidence of that.  The patient also had 1 tubular adenoma and random biopsies throughout the colon were negative.  His previous diagnosis by Dr. 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  The patient continues to report that he has loose bowel movements during the day with some days being worse than others.  He also states that he has down to 158 when he was 171 in March 2021.  He denies any black stools or bloody stools.  He also reports that he has not been trying to lose weight but does not feel hungry and does not have an appetite.  Past Medical History:  Diagnosis Date  . CAD (coronary artery disease)   . Chronic pain   . Diabetes mellitus without complication (HCC)   . DJD (degenerative joint disease)   . GERD (gastroesophageal reflux disease)   . Headache   . Hyperlipidemia   . Hypertension   . Hypogonadism male   . Kidney stones     Current Outpatient Medications  Medication Sig Dispense Refill  . ACCU-CHEK GUIDE test strip     . Accu-Chek Softclix Lancets lancets SMARTSIG:Topical    . acyclovir (ZOVIRAX) 200 MG capsule Take 200 mg by mouth 2 (two) times daily as needed (fever blisters).     . Atogepant (QULIPTA) 60 MG TABS Take by mouth.    Marland Kitchen atorvastatin  (LIPITOR) 40 MG tablet Take 1 tablet (40 mg total) by mouth daily. 90 tablet 3  . Blood Glucose Monitoring Suppl (ACCU-CHEK GUIDE) w/Device KIT     . cholecalciferol (VITAMIN D) 1000 units tablet Take 1,000 Units by mouth daily.    . hydrOXYzine (ATARAX/VISTARIL) 10 MG tablet Take 10 mg by mouth 3 (three) times daily as needed for itching.    . metFORMIN (GLUCOPHAGE-XR) 500 MG 24 hr tablet Take 1,000 mg by mouth 2 (two) times daily.    . metoprolol succinate (TOPROL-XL) 100 MG 24 hr tablet Take by mouth.    . nortriptyline (PAMELOR) 50 MG capsule Take 50 mg by mouth at bedtime.     . OXYCONTIN 10 MG 12 hr tablet Take 10 mg by mouth in the morning and at bedtime.    . pantoprazole (PROTONIX) 40 MG tablet Take 40 mg by mouth daily.    . SUMAtriptan (IMITREX) 100 MG tablet Take 100 mg by mouth every 2 (two) hours as needed for migraine.    Marland Kitchen SYRINGE-NEEDLE, DISP, 3 ML (MONOJECT 3CC SYR 23GX1") 23G X 1" 3 ML MISC Use 1 Syringe as directed    . tamsulosin (FLOMAX) 0.4 MG CAPS capsule Take 1 capsule (0.4 mg total) by mouth daily. 30 capsule 3  . testosterone cypionate (DEPOTESTOSTERONE CYPIONATE) 200 MG/ML injection Inject  200 mg into the muscle every 7 (seven) days.    . traZODone (DESYREL) 50 MG tablet Take 25 mg by mouth at bedtime as needed for sleep.     . cyanocobalamin (,VITAMIN B-12,) 1000 MCG/ML injection Inject into the muscle. (Patient not taking: Reported on 06/29/2021)    . EPINEPHrine 0.3 mg/0.3 mL IJ SOAJ injection Inject 0.3 mg into the muscle as needed for anaphylaxis. (Patient not taking: No sig reported)     No current facility-administered medications for this visit.    Allergies as of 06/29/2021 - Review Complete 06/29/2021  Allergen Reaction Noted  . Pravastatin Other (See Comments) 01/17/2016  . Celexa [citalopram] Rash 01/17/2016    ROS:  General: Negative for anorexia, weight loss, fever, chills, fatigue, weakness. ENT: Negative for hoarseness, difficulty swallowing ,  nasal congestion. CV: Negative for chest pain, angina, palpitations, dyspnea on exertion, peripheral edema.  Respiratory: Negative for dyspnea at rest, dyspnea on exertion, cough, sputum, wheezing.  GI: See history of present illness. GU:  Negative for dysuria, hematuria, urinary incontinence, urinary frequency, nocturnal urination.  Endo: Negative for unusual weight change.    Physical Examination:   BP (!) 129/95 (BP Location: Left Arm, Patient Position: Sitting, Cuff Size: Normal)   Pulse (!) 120   Temp 98.3 F (36.8 C) (Oral)   Ht $R'5\' 6"'kN$  (1.676 m)   Wt 158 lb 8 oz (71.9 kg)   BMI 25.58 kg/m   General: Well-nourished, well-developed in no acute distress.  Eyes: No icterus. Conjunctivae pink. Neuro: Alert and oriented x 3.  Grossly intact. Skin: Warm and dry, no jaundice.   Psych: Alert and cooperative, normal mood and affect.  Labs:    Imaging Studies: No results found.  Assessment and Plan:   [name] Skillman [name] is a 50 y.o. y/o male Who comes in with a history of pernicious anemia but he is unsure if that diagnosis still is correct.  He also has a history of irritable bowel syndrome but continues to have weight loss.  The patient is on desipramine for other reasons and reports that even when he stopped that he had a dry mouth and decreased appetite.  The patient has been told that he should be set up for a blood test for celiac sprue.  He has also been given samples of Viberzi to see if that helps with his diarrhea and appetite.  The patient will be contacted with the results.  The patient has been explained the plan and agrees with it.     [name] Lame, MD. Marval Regal    Note: This dictation was prepared with Dragon dictation along with smaller phrase technology. Any transcriptional errors that result from this process are unintentional.

## 2021-07-02 LAB — CELIAC DISEASE PANEL
Endomysial IgA: NEGATIVE
IgA/Immunoglobulin A, Serum: 295 mg/dL (ref 90–386)
Transglutaminase IgA: <2 U/mL (ref 0–3)

## 2021-07-03 ENCOUNTER — Telehealth: Payer: Self-pay

## 2021-07-03 NOTE — Telephone Encounter (Signed)
Pt notified of lab results through [place].

## 2021-07-03 NOTE — Telephone Encounter (Signed)
-----   Message from Lucilla Lame, MD sent at 07/03/2021  7:41 AM EDT ----- Let the patient know the sprue test was negative for a wheat allergy.

## 2021-07-05 DIAGNOSIS — R5383 Other fatigue: Secondary | ICD-10-CM | POA: Diagnosis not present

## 2021-07-05 DIAGNOSIS — G8921 Chronic pain due to trauma: Secondary | ICD-10-CM | POA: Diagnosis not present

## 2021-07-05 DIAGNOSIS — E538 Deficiency of other specified B group vitamins: Secondary | ICD-10-CM | POA: Diagnosis not present

## 2021-07-05 DIAGNOSIS — E291 Testicular hypofunction: Secondary | ICD-10-CM | POA: Diagnosis not present

## 2021-07-05 DIAGNOSIS — Z0000 Encounter for general adult medical examination without abnormal findings: Secondary | ICD-10-CM | POA: Diagnosis not present

## 2021-07-05 DIAGNOSIS — K219 Gastro-esophageal reflux disease without esophagitis: Secondary | ICD-10-CM | POA: Diagnosis not present

## 2021-07-05 DIAGNOSIS — E221 Hyperprolactinemia: Secondary | ICD-10-CM | POA: Diagnosis not present

## 2021-07-05 DIAGNOSIS — M2550 Pain in unspecified joint: Secondary | ICD-10-CM | POA: Diagnosis not present

## 2021-07-05 DIAGNOSIS — E1169 Type 2 diabetes mellitus with other specified complication: Secondary | ICD-10-CM | POA: Diagnosis not present

## 2021-07-05 DIAGNOSIS — G4701 Insomnia due to medical condition: Secondary | ICD-10-CM | POA: Diagnosis not present

## 2021-07-06 DIAGNOSIS — M791 Myalgia, unspecified site: Secondary | ICD-10-CM | POA: Diagnosis not present

## 2021-07-12 ENCOUNTER — Other Ambulatory Visit: Payer: Self-pay

## 2021-07-12 DIAGNOSIS — J301 Allergic rhinitis due to pollen: Secondary | ICD-10-CM | POA: Diagnosis not present

## 2021-07-12 MED ORDER — VIBERZI 75 MG PO TABS: 1.0000 | ORAL_TABLET | Freq: Two times a day (BID) | ORAL | 5 refills | Status: DC

## 2021-07-14 DIAGNOSIS — J301 Allergic rhinitis due to pollen: Secondary | ICD-10-CM | POA: Diagnosis not present

## 2021-07-24 DIAGNOSIS — R051 Acute cough: Secondary | ICD-10-CM | POA: Diagnosis not present

## 2021-07-24 DIAGNOSIS — J0110 Acute frontal sinusitis, unspecified: Secondary | ICD-10-CM | POA: Diagnosis not present

## 2021-07-24 DIAGNOSIS — R000 Tachycardia, unspecified: Secondary | ICD-10-CM | POA: Diagnosis not present

## 2021-07-24 DIAGNOSIS — R69 Illness, unspecified: Secondary | ICD-10-CM | POA: Diagnosis not present

## 2021-08-02 MED ORDER — METOPROLOL SUCCINATE ER 50 MG PO TB24: 75.0000 mg | ORAL_TABLET | Freq: Every day | ORAL | 3 refills | Status: DC

## 2021-08-07 NOTE — Telephone Encounter (Signed)
-----   Message from Sherren Mocha, MD sent at 08/06/2021  5:56 PM EDT ----- He reached out about an appointment. Please put him on one of my upcoming clinic schedules. I can't do this Friday because I'm leaving town after my quarter day. After that, it's fine to double book him somewhere or put him into a lunch hour or end of day 4:20 or 4:40 slot.   Thx - [place]

## 2021-08-07 NOTE — Telephone Encounter (Signed)
Called the pt and scheduled him to see Dr. Burt Knack on 11/4 at 1120.  Pt is aware to arrive 15 mins prior to this appt.   Pt verbalized understanding and agrees with this plan. Pt was more than gracious for all the assistance provided.

## 2021-08-17 ENCOUNTER — Other Ambulatory Visit: Payer: Self-pay | Admitting: Family Medicine

## 2021-08-17 DIAGNOSIS — R0789 Other chest pain: Secondary | ICD-10-CM

## 2021-08-17 DIAGNOSIS — R634 Abnormal weight loss: Secondary | ICD-10-CM

## 2021-08-25 ENCOUNTER — Encounter: Payer: Self-pay | Admitting: Cardiovascular Disease

## 2021-08-25 ENCOUNTER — Other Ambulatory Visit: Payer: Self-pay

## 2021-08-25 ENCOUNTER — Ambulatory Visit (INDEPENDENT_AMBULATORY_CARE_PROVIDER_SITE_OTHER): Payer: Medicare Other | Admitting: Cardiovascular Disease

## 2021-08-25 VITALS — BP 118/70 | HR 92 | Ht 66.0 in | Wt 155.0 lb

## 2021-08-25 DIAGNOSIS — I10 Essential (primary) hypertension: Secondary | ICD-10-CM | POA: Diagnosis not present

## 2021-08-25 DIAGNOSIS — I2510 Atherosclerotic heart disease of native coronary artery without angina pectoris: Secondary | ICD-10-CM

## 2021-08-25 DIAGNOSIS — E782 Mixed hyperlipidemia: Secondary | ICD-10-CM | POA: Diagnosis not present

## 2021-08-25 DIAGNOSIS — R000 Tachycardia, unspecified: Secondary | ICD-10-CM | POA: Diagnosis not present

## 2021-08-25 NOTE — Patient Instructions (Signed)
Medication Instructions:  Your physician recommends that you continue on your current medications as directed. Please refer to the Current Medication list given to you today.  *If you need a refill on your cardiac medications before your next appointment, please call your pharmacy*   Lab Work: None If you have labs (blood work) drawn today and your tests are completely normal, you will receive your results only by: [place] (if you have MyChart) OR A paper copy in the mail If you have any lab test that is abnormal or we need to change your treatment, we will call you to review the results.   Testing/Procedures: None   Follow-Up: At [facility], you and your health needs are our priority.  As part of our continuing mission to provide you with exceptional heart care, we have created designated Provider Care Teams.  These Care Teams include your primary Cardiologist (physician) and Advanced Practice Providers (APPs -  Physician Assistants and Nurse Practitioners) who all work together to provide you with the care you need, when you need it.  We recommend signing up for the patient portal called "MyChart".  Sign up information is provided on this After Visit Summary.  MyChart is used to connect with patients for Virtual Visits (Telemedicine).  Patients are able to view lab/test results, encounter notes, upcoming appointments, etc.  Non-urgent messages can be sent to your provider as well.   To learn more about what you can do with MyChart, go to [email or web address].    Your next appointment:   1 year(s)  The format for your next appointment:   In Person  Provider:   Sherren Mocha, MD     Other Instructions

## 2021-08-25 NOTE — Progress Notes (Signed)
Cardiology Office Note:    Date:  08/27/2021   ID:  [name] Brown [facility] Jr., DOB 28-Nov-1970, MRN [id number]  PCP:  Sofie Hartigan, MD   [facility] Prov[facility] Cardiologist:  Sherren Mocha, MD     Referring MD: Sofie Hartigan, MD   Chief Complaint  Patient presents with   Palpitations    History of Present Illness:    [name] Brown [name] Brown. is a 50 y.o. male with a hx of nonobstructive coronary artery disease, sinus tachycardia, type 2 diabetes, hypertension, and mixed hyperlipidemia, presenting for follow-up evaluation.  He was last seen in a telehealth visit by [name] Brown in June of this year.  At that time he was seen for shortness of breath and fatigue.  He was noted to have anemia and was evaluated by hematology.  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  The patient is here with his wife today.  He continues to have a multitude of symptoms.  He can feel his heart beating hard at times.  He states that sometimes with activity his heart rate is in 80s and 90s, but at rest he will have a heart rate greater than 100 bpm.  He has been struggling with weight loss and poor appetite.  He also has times where he feels like he cannot take a deep breath.  He has not had exertional chest pain or pressure.  No lightheadedness or syncope.  He has developed some anxiety around all of his symptoms and medical problems that have come up over the past few years.  Past Medical History:  Diagnosis Date   CAD (coronary artery disease)    Chronic pain    Diabetes mellitus without complication (HCC)    DJD (degenerative joint disease)    GERD (gastroesophageal reflux disease)    Headache    Hyperlipidemia    Hypertension    Hypogonadism male    Kidney stones     Past Surgical History:  Procedure Laterality Date   BICEPT TENODESIS Left 12/30/2019   Procedure: MINI BICEPS TENODESIS;  Surgeon: Corky Mull, MD;  Location: [facility]  ORS;  Service: Orthopedics;  Laterality: Left;   COLONOSCOPY WITH PROPOFOL N/A 01/05/2019   Procedure: COLONOSCOPY WITH PROPOFOL;  Surgeon: Lollie Sails, MD;  Location: [facility] ENDOSCOPY;  Service: Endoscopy;  Laterality: N/A;   ESOPHAGOGASTRODUODENOSCOPY (EGD) WITH PROPOFOL N/A 01/05/2019   Procedure: ESOPHAGOGASTRODUODENOSCOPY (EGD) WITH PROPOFOL;  Surgeon: Lollie Sails, MD;  Location: [facility] ENDOSCOPY;  Service: Endoscopy;  Laterality: N/A;   EXTRACORPOREAL SHOCK WAVE LITHOTRIPSY Right 05/14/2019   Procedure: EXTRACORPOREAL SHOCK WAVE LITHOTRIPSY (ESWL);  Surgeon: [name], MD;  Location: [facility] ORS;  Service: Urology;  Laterality: Right;   GIVENS CAPSULE STUDY N/A 04/10/2021   Procedure: GIVENS CAPSULE STUDY;  Surgeon: Lucilla Lame, MD;  Location: [facility] ENDOSCOPY;  Service: Endoscopy;  Laterality: N/A;   KNEE ARTHROSCOPY     KNEE ARTHROSCOPY WITH MENISCAL REPAIR Right 01/19/2016   Procedure: KNEE ARTHROSCOPY WITH MENISCAL REPAIR;  Surgeon: Corky Mull, MD;  Location: [facility] ORS;  Service: Orthopedics;  Laterality: Right;   KNEE ARTHROSCOPY WITH MENISCAL REPAIR Right 06/12/2016   Procedure: KNEE ARTHROSCOPY WITH PARTIAL MEDIAL MENISCAL REPAIR;  Surgeon: Corky Mull, MD;  Location: [facility] ORS;  Service: Orthopedics;  Laterality: Right;   PARATHYROIDECTOMY     SHOULDER ARTHROSCOPY WITH ROTATOR CUFF REPAIR Left 12/30/2019   Procedure: SHOULDER ARTHROSCOPY WITH ROTATOR CUFF REPAIR;  Surgeon: Corky Mull, MD;  Location: [facility] ORS;  Service: Orthopedics;  Laterality: Left;   SHOULDER SURGERY Bilateral    SPINAL CORD STIMULATOR BATTERY EXCHANGE     SPINAL CORD STIMULATOR IMPLANT  2008   SPINAL FUSION     L4-L5 (2004), L5-S1 (2006)   THYROIDECTOMY, PARTIAL     TONSILLECTOMY      Current Medications: Current Meds  Medication Sig   ACCU-CHEK GUIDE test strip    Accu-Chek Softclix Lancets lancets SMARTSIG:Topical   acyclovir (ZOVIRAX) 200 MG capsule Take 200 mg by mouth 2 (two) times daily  as needed (fever blisters).    Atogepant (QULIPTA) 60 MG TABS Take by mouth.   atorvastatin (LIPITOR) 40 MG tablet Take 1 tablet (40 mg total) by mouth daily.   Blood Glucose Monitoring Suppl (ACCU-CHEK GUIDE) w/Device KIT    cholecalciferol (VITAMIN D) 1000 units tablet Take 1,000 Units by mouth daily.   cyanocobalamin (,VITAMIN B-12,) 1000 MCG/ML injection Inject into the muscle.   Eluxadoline (VIBERZI) 75 MG TABS Take 1 tablet by mouth 2 (two) times daily.   EPINEPHrine 0.3 mg/0.3 mL IJ SOAJ injection Inject 0.3 mg into the muscle as needed for anaphylaxis.   hydrOXYzine (ATARAX/VISTARIL) 10 MG tablet Take 10 mg by mouth 3 (three) times daily as needed for itching.   metFORMIN (GLUCOPHAGE-XR) 500 MG 24 hr tablet Take 1,000 mg by mouth 2 (two) times daily.   metoprolol succinate (TOPROL-XL) 50 MG 24 hr tablet Take 1.5 tablets (75 mg total) by mouth daily. Take with or immediately following a meal.   nortriptyline (PAMELOR) 50 MG capsule Take 50 mg by mouth at bedtime.    OXYCONTIN 10 MG 12 hr tablet Take 10 mg by mouth in the morning and at bedtime.   pantoprazole (PROTONIX) 40 MG tablet Take 40 mg by mouth daily.   SUMAtriptan (IMITREX) 100 MG tablet Take 100 mg by mouth every 2 (two) hours as needed for migraine.   SYRINGE-NEEDLE, DISP, 3 ML (MONOJECT 3CC SYR 23GX1") 23G X 1" 3 ML MISC Use 1 Syringe as directed   tamsulosin (FLOMAX) 0.4 MG CAPS capsule Take 1 capsule (0.4 mg total) by mouth daily.   testosterone cypionate (DEPOTESTOSTERONE CYPIONATE) 200 MG/ML injection Inject 200 mg into the muscle every 7 (seven) days.   traZODone (DESYREL) 50 MG tablet Take 25 mg by mouth at bedtime as needed for sleep.      Allergies:   Pravastatin and Celexa [citalopram]   Social History   Socioeconomic History   Marital status: Married    Spouse name: Not on file   Number of children: Not on file   Years of education: Not on file   Highest education level: Not on file  Occupational History    Not on file  Tobacco Use   Smoking status: Never   Smokeless tobacco: Never  Vaping Use   Vaping Use: Never used  Substance and Sexual Activity   Alcohol use: No   Drug use: No   Sexual activity: Yes    Birth control/protection: None  Other Topics Concern   Not on file  Social History Narrative   Not on file   Social Determinants of Health   Financial Resource Strain: Not on file  Food Insecurity: Not on file  Transportation Needs: Not on file  Physical Activity: Not on file  Stress: Not on file  Social Connections: Not on file     Family History: The patient's family history is negative for Bladder  Cancer, Kidney cancer, and Prostate cancer.  ROS:   Please see the history of present illness.    All other systems reviewed and are negative.  EKGs/Labs/Other Studies Reviewed:    The following studies were reviewed today: Event monitor 04/11/2020: The basic rhythm is normal sinus with an average HR of 105 bpm No atrial fibrillation or flutter No high-grade heart block or pathologic pauses There are rare PVC's and rare supraventricular beats without sustained arrhythmias  Echo 12/12/2020: IMPRESSIONS     1. Left ventricular ejection fraction, by estimation, is 60 to 65%. Left  ventricular ejection fraction by 3D volume is 62 %. The left ventricle has  normal function. The left ventricle has no regional wall motion  abnormalities. Left ventricular diastolic   parameters were normal.   2. Right ventricular systolic function is normal. The right ventricular  size is normal.   3. The mitral valve is grossly normal. Mild mitral valve regurgitation.  No evidence of mitral stenosis.   4. The aortic valve is tricuspid. Aortic valve regurgitation is not  visualized. No aortic stenosis is present.   5. The inferior vena cava is normal in size with greater than 50%  respiratory variability, suggesting right atrial pressure of 3 mmHg.   Conclusion(s)/Recommendation(s):  Normal biventricular function without  evidence of hemodynamically significant valvular heart disease.   Coronary CTA 04/21/2019: FINDINGS: Non-cardiac: See separate report from [facility] Radiology. No significant findings on limited lung and soft tissue windows.   Calcium score: Calcium noted in proximal LAD and proximal and mid circumflex   Coronary Arteries: Right dominant with no anomalies   LM: Normal   LAD: 25-49% calcific plaque in proximal LAD   D1: Small vessel normal   D2: Large branching vessel 1-24% soft plaque proximally   Circumflex: 25-49% calcific plaque in proximal and mid vessel   OM1: 25-49% calcific plaque in proximal vessel   OM2: Normal   RCA: 1-24% soft plaque in proximal and mid vessel   PDA: Normal   PLA: Normal   IMPRESSION: 1. Calcium score 257 which is 88 th percentile for age and sex   2. CAD RADS 2 age advanced but non obstructive CAD involving proximal LAD, circumflex and OM1   3.  Normal aortic root 3.1 cm   EKG:  EKG is not ordered today.    Recent Labs: 03/28/2021: ALT 40; BUN 8; Creatinine, Ser 0.96; Potassium 4.2; Sodium 137 05/09/2021: Hemoglobin 16.2; Platelets 269  Recent Lipid Panel    Component Value Date/Time   CHOL 132 02/12/2020 0830   TRIG 59 02/12/2020 0830   HDL 47 02/12/2020 0830   CHOLHDL 2.8 02/12/2020 0830   VLDL 12 02/12/2020 0830   LDLCALC 73 02/12/2020 0830     Risk Assessment/Calculations:           Physical Exam:    VS:  BP 118/70   Pulse 92   Ht 5' 6" (1.676 m)   Wt 155 lb (70.3 kg)   SpO2 97%   BMI 25.02 kg/m     Wt Readings from Last 3 Encounters:  08/25/21 155 lb (70.3 kg)  06/29/21 158 lb 8 oz (71.9 kg)  05/09/21 165 lb (74.8 kg)     GEN:  Well nourished, well developed in no acute distress HEENT: Normal NECK: No JVD; No carotid bruits LYMPHATICS: No lymphadenopathy CARDIAC: RRR, no murmurs, rubs, gallops RESPIRATORY:  Clear to auscultation without rales, wheezing or rhonchi   ABDOMEN: Soft, non-tender, non-distended MUSCULOSKELETAL:  No  edema; No deformity  SKIN: Warm and dry NEUROLOGIC:  Alert and oriented x 3 PSYCHIATRIC:  Normal affect   ASSESSMENT:    1. Coronary artery disease involving native coronary artery of native heart without angina pectoris   2. Mixed hyperlipidemia   3. Sinus tachycardia   4. Essential hypertension    PLAN:    In order of problems listed above:  We discussed the need to continue atorvastatin for lipid lowering.  The patient is also treated with a beta-blocker.  I reviewed his coronary CTA study as outlined above.  He has no high-grade obstructive CAD. Treated with atorvastatin.  Last lipids showed a cholesterol 132, HDL 47, LDL 73, triglycerides 59. Event monitor reviewed.  No significant arrhythmias but he was noted to have an elevated resting heart rate with sinus rhythm throughout his recordings.  He will continue on his current dose of metoprolol succinate. Blood pressure well controlled on current medical therapy.  Lengthy discussion about the patient's general care today.  I cautioned him about the use of testosterone as his hemoglobin is now in the high end of normal range.  He will follow-up with his endocrinologist on this.  From a cardiac standpoint, I think it is important he continue on atorvastatin and metoprolol succinate at current doses.  He will continue to work to minimize other medications.  I think his general sense of wellbeing may improve if he can reduce the number of medicines that he is taking.           Medication Adjustments/Labs and Tests Ordered: Current medicines are reviewed at length with the patient today.  Concerns regarding medicines are outlined above.  No orders of the defined types were placed in this encounter.  No orders of the defined types were placed in this encounter.   Patient Instructions  Medication Instructions:  Your physician recommends that you continue on your  current medications as directed. Please refer to the Current Medication list given to you today.  *If you need a refill on your cardiac medications before your next appointment, please call your pharmacy*   Lab Work: None If you have labs (blood work) drawn today and your tests are completely normal, you will receive your results only by: [place] (if you have MyChart) OR A paper copy in the mail If you have any lab test that is abnormal or we need to change your treatment, we will call you to review the results.   Testing/Procedures: None   Follow-Up: At [facility], you and your health needs are our priority.  As part of our continuing mission to provide you with exceptional heart care, we have created designated Provider Care Teams.  These Care Teams include your primary Cardiologist (physician) and Advanced Practice Providers (APPs -  Physician Assistants and Nurse Practitioners) who all work together to provide you with the care you need, when you need it.  We recommend signing up for the patient portal called "MyChart".  Sign up information is provided on this After Visit Summary.  MyChart is used to connect with patients for Virtual Visits (Telemedicine).  Patients are able to view lab/test results, encounter notes, upcoming appointments, etc.  Non-urgent messages can be sent to your provider as well.   To learn more about what you can do with MyChart, go to [email or web address].    Your next appointment:   1 year(s)  The format for your next appointment:   In Person  Provider:   Sherren Mocha, MD  Other Instructions      Signed, Sherren Mocha, MD  08/27/2021 3:05 PM    [facility]

## 2021-08-27 ENCOUNTER — Encounter: Payer: Self-pay | Admitting: Cardiovascular Disease

## 2021-09-01 ENCOUNTER — Other Ambulatory Visit: Payer: Self-pay | Admitting: Physician Assistant

## 2021-09-01 MED ORDER — METOPROLOL SUCCINATE ER 50 MG PO TB24: 75.0000 mg | ORAL_TABLET | Freq: Two times a day (BID) | ORAL | 3 refills | Status: DC

## 2021-09-13 ENCOUNTER — Ambulatory Visit
Admission: RE | Admit: 2021-09-13 | Discharge: 2021-09-13 | Disposition: A | Discharge status: Home / Self Care | Payer: Medicare Other | Source: Ambulatory Visit | Attending: Family Medicine | Admitting: Family Medicine

## 2021-09-13 ENCOUNTER — Other Ambulatory Visit: Payer: Self-pay

## 2021-09-13 DIAGNOSIS — I700 Atherosclerosis of aorta: Secondary | ICD-10-CM | POA: Diagnosis not present

## 2021-09-13 DIAGNOSIS — I2510 Atherosclerotic heart disease of native coronary artery without angina pectoris: Secondary | ICD-10-CM | POA: Diagnosis not present

## 2021-09-13 DIAGNOSIS — R634 Abnormal weight loss: Secondary | ICD-10-CM | POA: Diagnosis not present

## 2021-09-13 DIAGNOSIS — R0789 Other chest pain: Secondary | ICD-10-CM | POA: Diagnosis not present

## 2021-09-13 LAB — POCT I-STAT CREATININE: Creatinine, Ser: 1 mg/dL (ref 0.61–1.24)

## 2021-09-13 MED ORDER — IOHEXOL 300 MG/ML  SOLN
100.0000 mL | Freq: Once | INTRAMUSCULAR | Status: AC | PRN
  Administered 2021-09-13: 100 mL via INTRAVENOUS

## 2021-09-22 DIAGNOSIS — M47814 Spondylosis without myelopathy or radiculopathy, thoracic region: Secondary | ICD-10-CM | POA: Diagnosis not present

## 2021-09-22 DIAGNOSIS — M25559 Pain in unspecified hip: Secondary | ICD-10-CM | POA: Diagnosis not present

## 2021-09-22 DIAGNOSIS — M533 Sacrococcygeal disorders, not elsewhere classified: Secondary | ICD-10-CM | POA: Diagnosis not present

## 2021-09-22 DIAGNOSIS — M5412 Radiculopathy, cervical region: Secondary | ICD-10-CM | POA: Diagnosis not present

## 2021-09-22 DIAGNOSIS — G894 Chronic pain syndrome: Secondary | ICD-10-CM | POA: Diagnosis not present

## 2021-09-22 DIAGNOSIS — Z79899 Other long term (current) drug therapy: Secondary | ICD-10-CM | POA: Diagnosis not present

## 2021-09-22 DIAGNOSIS — M791 Myalgia, unspecified site: Secondary | ICD-10-CM | POA: Diagnosis not present

## 2021-10-02 ENCOUNTER — Encounter: Payer: Self-pay | Admitting: Cardiovascular Disease

## 2021-10-06 ENCOUNTER — Telehealth: Payer: Self-pay

## 2021-10-06 DIAGNOSIS — I2510 Atherosclerotic heart disease of native coronary artery without angina pectoris: Secondary | ICD-10-CM

## 2021-10-06 DIAGNOSIS — R079 Chest pain, unspecified: Secondary | ICD-10-CM

## 2021-10-06 NOTE — Telephone Encounter (Signed)
Order placed for exercise myoview.  Will send attestation.

## 2021-10-11 ENCOUNTER — Telehealth (HOSPITAL_COMMUNITY): Payer: Self-pay | Admitting: *Deleted

## 2021-10-11 ENCOUNTER — Other Ambulatory Visit: Payer: Self-pay | Admitting: Cardiovascular Disease

## 2021-10-11 ENCOUNTER — Encounter: Payer: Self-pay | Admitting: Internal Medicine

## 2021-10-11 NOTE — Telephone Encounter (Signed)
Patient given detailed instructions per Myocardial Perfusion Study Information Sheet for the test on 10/19/21  Patient notified to arrive 15 minutes early and that it is imperative to arrive on time for appointment to keep from having the test rescheduled.  If you need to cancel or reschedule your appointment, please call the office within 24 hours of your appointment. . Patient verbalized understanding. Kirstie Peri

## 2021-10-12 ENCOUNTER — Encounter: Payer: Self-pay | Admitting: Hematology and Oncology

## 2021-10-19 ENCOUNTER — Other Ambulatory Visit: Payer: Self-pay

## 2021-10-19 ENCOUNTER — Ambulatory Visit (HOSPITAL_COMMUNITY): Payer: Medicare Other | Attending: Cardiovascular Disease

## 2021-10-19 DIAGNOSIS — R079 Chest pain, unspecified: Secondary | ICD-10-CM | POA: Insufficient documentation

## 2021-10-19 DIAGNOSIS — I2510 Atherosclerotic heart disease of native coronary artery without angina pectoris: Secondary | ICD-10-CM | POA: Diagnosis not present

## 2021-10-19 LAB — MYOCARDIAL PERFUSION IMAGING
Angina Index: 0
Duke Treadmill Score: 9
Estimated workload: 10.1
Exercise duration (min): 8 min
Exercise duration (sec): 41 s
LV dias vol: 79 mL (ref 62–150)
LV sys vol: 36 mL
MPHR: 169 {beats}/min
Nuc Stress EF: 54 %
Peak HR: 155 {beats}/min
Percent HR: 91 %
Rest HR: 82 {beats}/min
Rest Nuclear Isotope Dose: 10.7 mCi
SDS: 0
SRS: 0
SSS: 0
ST Depression (mm): 0 mm
Stress Nuclear Isotope Dose: 30.6 mCi
TID: 0.94

## 2021-10-19 MED ORDER — TECHNETIUM TC 99M TETROFOSMIN IV KIT
10.7000 | PACK | Freq: Once | INTRAVENOUS | Status: AC | PRN
  Administered 2021-10-19: 10.7 via INTRAVENOUS
  Filled 2021-10-19: qty 11

## 2021-10-19 MED ORDER — TECHNETIUM TC 99M TETROFOSMIN IV KIT
30.6000 | PACK | Freq: Once | INTRAVENOUS | Status: AC | PRN
  Administered 2021-10-19: 30.6 via INTRAVENOUS
  Filled 2021-10-19: qty 31

## 2021-10-25 ENCOUNTER — Other Ambulatory Visit: Payer: Self-pay | Admitting: *Deleted

## 2021-10-25 DIAGNOSIS — D509 Iron deficiency anemia, unspecified: Secondary | ICD-10-CM

## 2021-10-31 ENCOUNTER — Other Ambulatory Visit: Payer: Self-pay

## 2021-10-31 ENCOUNTER — Other Ambulatory Visit: Payer: 59

## 2021-10-31 ENCOUNTER — Inpatient Hospital Stay: Payer: Medicare Other | Attending: Internal Medicine

## 2021-10-31 DIAGNOSIS — E538 Deficiency of other specified B group vitamins: Secondary | ICD-10-CM

## 2021-10-31 DIAGNOSIS — E611 Iron deficiency: Secondary | ICD-10-CM

## 2021-10-31 DIAGNOSIS — R682 Dry mouth, unspecified: Secondary | ICD-10-CM | POA: Insufficient documentation

## 2021-10-31 DIAGNOSIS — H04123 Dry eye syndrome of bilateral lacrimal glands: Secondary | ICD-10-CM | POA: Diagnosis not present

## 2021-10-31 DIAGNOSIS — G8929 Other chronic pain: Secondary | ICD-10-CM | POA: Diagnosis not present

## 2021-10-31 DIAGNOSIS — R002 Palpitations: Secondary | ICD-10-CM | POA: Diagnosis not present

## 2021-10-31 DIAGNOSIS — D509 Iron deficiency anemia, unspecified: Secondary | ICD-10-CM | POA: Diagnosis not present

## 2021-10-31 DIAGNOSIS — F32A Depression, unspecified: Secondary | ICD-10-CM | POA: Diagnosis not present

## 2021-10-31 DIAGNOSIS — D510 Vitamin B12 deficiency anemia due to intrinsic factor deficiency: Secondary | ICD-10-CM | POA: Insufficient documentation

## 2021-10-31 DIAGNOSIS — K589 Irritable bowel syndrome without diarrhea: Secondary | ICD-10-CM | POA: Insufficient documentation

## 2021-10-31 DIAGNOSIS — M549 Dorsalgia, unspecified: Secondary | ICD-10-CM | POA: Diagnosis not present

## 2021-10-31 LAB — CBC WITH DIFFERENTIAL/PLATELET
Abs Immature Granulocytes: 0.07 10*3/uL (ref 0.00–0.07)
Basophils Absolute: 0.1 10*3/uL (ref 0.0–0.1)
Basophils Relative: 1 %
Eosinophils Absolute: 0.1 10*3/uL (ref 0.0–0.5)
Eosinophils Relative: 1 %
HCT: 50.9 % (ref 39.0–52.0)
Hemoglobin: 16.8 g/dL (ref 13.0–17.0)
Immature Granulocytes: 1 %
Lymphocytes Relative: 29 %
Lymphs Abs: 2.4 10*3/uL (ref 0.7–4.0)
MCH: 30.1 pg (ref 26.0–34.0)
MCHC: 33 g/dL (ref 30.0–36.0)
MCV: 91.2 fL (ref 80.0–100.0)
Monocytes Absolute: 0.7 10*3/uL (ref 0.1–1.0)
Monocytes Relative: 8 %
Neutro Abs: 4.9 10*3/uL (ref 1.7–7.7)
Neutrophils Relative %: 60 %
Platelets: 293 10*3/uL (ref 150–400)
RBC: 5.58 MIL/uL (ref 4.22–5.81)
RDW: 12.9 % (ref 11.5–15.5)
WBC: 8.2 10*3/uL (ref 4.0–10.5)
nRBC: 0 % (ref 0.0–0.2)

## 2021-10-31 LAB — COMPREHENSIVE METABOLIC PANEL
ALT: 20 U/L (ref 0–44)
AST: 17 U/L (ref 15–41)
Albumin: 3.7 g/dL (ref 3.5–5.0)
Alkaline Phosphatase: 39 U/L (ref 38–126)
Anion gap: 7 (ref 5–15)
BUN: 10 mg/dL (ref 6–20)
CO2: 29 mmol/L (ref 22–32)
Calcium: 8.5 mg/dL — ABNORMAL LOW (ref 8.9–10.3)
Chloride: 100 mmol/L (ref 98–111)
Creatinine, Ser: 1 mg/dL (ref 0.61–1.24)
GFR, Estimated: >60 mL/min (ref >60)
Glucose, Bld: 136 mg/dL — ABNORMAL HIGH (ref 70–99)
Potassium: 3.7 mmol/L (ref 3.5–5.1)
Sodium: 136 mmol/L (ref 135–145)
Total Bilirubin: 0.6 mg/dL (ref 0.3–1.2)
Total Protein: 6.1 g/dL — ABNORMAL LOW (ref 6.5–8.1)

## 2021-10-31 LAB — IRON AND TIBC
Iron: 47 ug/dL (ref 45–182)
Saturation Ratios: 12 % — ABNORMAL LOW (ref 17.9–39.5)
TIBC: 379 ug/dL (ref 250–450)
UIBC: 332 ug/dL

## 2021-10-31 LAB — FERRITIN: Ferritin: 7 ng/mL — ABNORMAL LOW (ref 24–336)

## 2021-10-31 LAB — VITAMIN B12: Vitamin B-12: 311 pg/mL (ref 180–914)

## 2021-11-07 ENCOUNTER — Inpatient Hospital Stay: Payer: Medicare Other

## 2021-11-07 ENCOUNTER — Other Ambulatory Visit: Payer: Self-pay

## 2021-11-07 ENCOUNTER — Encounter: Payer: Self-pay | Admitting: Nurse Practitioner

## 2021-11-07 ENCOUNTER — Inpatient Hospital Stay (HOSPITAL_BASED_OUTPATIENT_CLINIC_OR_DEPARTMENT_OTHER): Payer: Medicare Other | Admitting: Nurse Practitioner

## 2021-11-07 VITALS — BP 117/81 | HR 82 | Temp 96.2°F | Resp 18 | Wt 159.0 lb

## 2021-11-07 DIAGNOSIS — R682 Dry mouth, unspecified: Secondary | ICD-10-CM | POA: Diagnosis not present

## 2021-11-07 DIAGNOSIS — D510 Vitamin B12 deficiency anemia due to intrinsic factor deficiency: Secondary | ICD-10-CM | POA: Diagnosis not present

## 2021-11-07 DIAGNOSIS — D509 Iron deficiency anemia, unspecified: Secondary | ICD-10-CM | POA: Diagnosis not present

## 2021-11-07 DIAGNOSIS — G8929 Other chronic pain: Secondary | ICD-10-CM | POA: Diagnosis not present

## 2021-11-07 DIAGNOSIS — R002 Palpitations: Secondary | ICD-10-CM | POA: Diagnosis not present

## 2021-11-07 DIAGNOSIS — M549 Dorsalgia, unspecified: Secondary | ICD-10-CM | POA: Diagnosis not present

## 2021-11-07 DIAGNOSIS — D519 Vitamin B12 deficiency anemia, unspecified: Secondary | ICD-10-CM | POA: Diagnosis not present

## 2021-11-07 DIAGNOSIS — K589 Irritable bowel syndrome without diarrhea: Secondary | ICD-10-CM | POA: Diagnosis not present

## 2021-11-07 DIAGNOSIS — G43719 Chronic migraine without aura, intractable, without status migrainosus: Secondary | ICD-10-CM | POA: Diagnosis not present

## 2021-11-07 DIAGNOSIS — H04123 Dry eye syndrome of bilateral lacrimal glands: Secondary | ICD-10-CM | POA: Diagnosis not present

## 2021-11-07 DIAGNOSIS — F32A Depression, unspecified: Secondary | ICD-10-CM | POA: Diagnosis not present

## 2021-11-07 DIAGNOSIS — E611 Iron deficiency: Secondary | ICD-10-CM

## 2021-11-07 MED ORDER — SODIUM CHLORIDE 0.9 % IV SOLN: 200.0000 mg | Freq: Once | INTRAVENOUS | Status: DC

## 2021-11-07 MED ORDER — IRON SUCROSE 20 MG/ML IV SOLN
200.0000 mg | Freq: Once | INTRAVENOUS | Status: AC
  Administered 2021-11-07: 200 mg via INTRAVENOUS
  Filled 2021-11-07: qty 10

## 2021-11-07 MED ORDER — SODIUM CHLORIDE 0.9 % IV SOLN
INTRAVENOUS | Status: DC
  Filled 2021-11-07: qty 250

## 2021-11-07 NOTE — Progress Notes (Signed)
Patient here for oncology follow-up appointment, concerns  of headaches, fatigue, chest discomfort and SOB

## 2021-11-07 NOTE — Progress Notes (Signed)
[place] NOTE  Patient Care Team: Sofie Hartigan, MD as PCP - General (Family Medicine) Sherren Mocha, MD as PCP - Cardiology (Cardiology) Lequita Asal, MD (Inactive) as Referring Physician (Hematology and Oncology) Cammie Sickle, MD as Consulting Physician (Hematology and Oncology)  CHIEF COMPLAINTS/PURPOSE OF CONSULTATION:   #Pernicious anemia [name] 2022-palpitations cardiology Dr. Burt Knack work-up]-iron deficiency  B12deficiency; intrinsic factor antibodies- s/p B12- IV venofer; GI  #Hypogonadism-testosterone supplementation/partial thyroidectomy [benign "tumor"]-Dr. Honor Junes  #Chronic back pain/pain clinic-OxyContin; stimulator  Oncology History   No history exists.     HISTORY OF PRESENTING ILLNESS:  [name] Brown [facility]. 51 y.o.  male with above history of B12 deficiency/iron deficiency secondary to pernicious anemia due for follow-up.  He's been taking b12 injections at home monthly. Says he feels terrible and has for past 2 years. Briefly improved after receiving iron but then returned. Says he feels exhausted, lack of interest in activities, no longer goes to the beach with his family to fish. Feels jittery and shakes at times. Has had approximately 10 lb weight loss over past year; has been seeing Dr. Rolena Infante. Has feelings of palpitations and sees Dr. Burt Knack, cardiology. Has had thoughts of 'leaving this world'. Denies plan or action.   Review of Systems  Constitutional:  Positive for malaise/fatigue and weight loss. Negative for chills, diaphoresis and fever.  HENT:  Negative for nosebleeds and sore throat.   Eyes:  Negative for double vision.  Respiratory:  Negative for cough, hemoptysis, sputum production, shortness of breath and wheezing.   Cardiovascular:  Positive for palpitations. Negative for chest pain, orthopnea and leg swelling.  Gastrointestinal:  Negative for abdominal pain, blood in stool, constipation, diarrhea,  heartburn, melena, nausea and vomiting.  Genitourinary:  Negative for dysuria, frequency and urgency.  Musculoskeletal:  Positive for back pain. Negative for joint pain and myalgias.  Skin: Negative.  Negative for itching and rash.  Neurological:  Positive for headaches. Negative for tingling, focal weakness and weakness.  Endo/Heme/Allergies:  Does not bruise/bleed easily.  Psychiatric/Behavioral:  Positive for depression and suicidal ideas. Negative for hallucinations. The patient is nervous/anxious. The patient does not have insomnia.     MEDICAL HISTORY:  Past Medical History:  Diagnosis Date   CAD (coronary artery disease)    Chronic pain    Diabetes mellitus without complication (HCC)    DJD (degenerative joint disease)    GERD (gastroesophageal reflux disease)    Headache    Hyperlipidemia    Hypertension    Hypogonadism male    Kidney stones     SURGICAL HISTORY: Past Surgical History:  Procedure Laterality Date   BICEPT TENODESIS Left 12/30/2019   Procedure: MINI BICEPS TENODESIS;  Surgeon: Corky Mull, MD;  Location: [facility] ORS;  Service: Orthopedics;  Laterality: Left;   COLONOSCOPY WITH PROPOFOL N/A 01/05/2019   Procedure: COLONOSCOPY WITH PROPOFOL;  Surgeon: Lollie Sails, MD;  Location: [facility] ENDOSCOPY;  Service: Endoscopy;  Laterality: N/A;   ESOPHAGOGASTRODUODENOSCOPY (EGD) WITH PROPOFOL N/A 01/05/2019   Procedure: ESOPHAGOGASTRODUODENOSCOPY (EGD) WITH PROPOFOL;  Surgeon: Lollie Sails, MD;  Location: [facility] ENDOSCOPY;  Service: Endoscopy;  Laterality: N/A;   EXTRACORPOREAL SHOCK WAVE LITHOTRIPSY Right 05/14/2019   Procedure: EXTRACORPOREAL SHOCK WAVE LITHOTRIPSY (ESWL);  Surgeon: [name], MD;  Location: [facility] ORS;  Service: Urology;  Laterality: Right;   GIVENS CAPSULE STUDY N/A 04/10/2021   Procedure: GIVENS CAPSULE STUDY;  Surgeon: Lucilla Lame, MD;  Location: [facility] ENDOSCOPY;  Service: Endoscopy;  Laterality:  N/A;   KNEE ARTHROSCOPY     KNEE  ARTHROSCOPY WITH MENISCAL REPAIR Right 01/19/2016   Procedure: KNEE ARTHROSCOPY WITH MENISCAL REPAIR;  Surgeon: Corky Mull, MD;  Location: [facility] ORS;  Service: Orthopedics;  Laterality: Right;   KNEE ARTHROSCOPY WITH MENISCAL REPAIR Right 06/12/2016   Procedure: KNEE ARTHROSCOPY WITH PARTIAL MEDIAL MENISCAL REPAIR;  Surgeon: Corky Mull, MD;  Location: [facility] ORS;  Service: Orthopedics;  Laterality: Right;   PARATHYROIDECTOMY     SHOULDER ARTHROSCOPY WITH ROTATOR CUFF REPAIR Left 12/30/2019   Procedure: SHOULDER ARTHROSCOPY WITH ROTATOR CUFF REPAIR;  Surgeon: Corky Mull, MD;  Location: [facility] ORS;  Service: Orthopedics;  Laterality: Left;   SHOULDER SURGERY Bilateral    SPINAL CORD STIMULATOR BATTERY EXCHANGE     SPINAL CORD STIMULATOR IMPLANT  2008   SPINAL FUSION     L4-L5 (2004), L5-S1 (2006)   THYROIDECTOMY, PARTIAL     TONSILLECTOMY      SOCIAL HISTORY: Social History   Socioeconomic History   Marital status: Married    Spouse name: Not on file   Number of children: Not on file   Years of education: Not on file   Highest education level: Not on file  Occupational History   Not on file  Tobacco Use   Smoking status: Never   Smokeless tobacco: Never  Vaping Use   Vaping Use: Never used  Substance and Sexual Activity   Alcohol use: No   Drug use: No   Sexual activity: Yes    Birth control/protection: None  Other Topics Concern   Not on file  Social History Narrative   Not on file   Social Determinants of Health   Financial Resource Strain: Not on file  Food Insecurity: Not on file  Transportation Needs: Not on file  Physical Activity: Not on file  Stress: Not on file  Social Connections: Not on file  Intimate Partner Violence: Not on file    FAMILY HISTORY: Family History  Problem Relation Age of Onset   Bladder Cancer Neg Hx    Kidney cancer Neg Hx    Prostate cancer Neg Hx     ALLERGIES:  is allergic to pravastatin and celexa  [citalopram].  MEDICATIONS:  Current Outpatient Medications  Medication Sig Dispense Refill   ACCU-CHEK GUIDE test strip      Accu-Chek Softclix Lancets lancets SMARTSIG:Topical     acyclovir (ZOVIRAX) 200 MG capsule Take 200 mg by mouth 2 (two) times daily as needed (fever blisters).      Atogepant (QULIPTA) 60 MG TABS Take by mouth.     atorvastatin (LIPITOR) 40 MG tablet TAKE 1 TABLET BY MOUTH EVERY DAY 90 tablet 3   Blood Glucose Monitoring Suppl (ACCU-CHEK GUIDE) w/Device KIT      cefdinir (OMNICEF) 300 MG capsule Take 300 mg by mouth 2 (two) times daily. (Patient not taking: Reported on 08/25/2021)     cholecalciferol (VITAMIN D) 1000 units tablet Take 1,000 Units by mouth daily.     cyanocobalamin (,VITAMIN B-12,) 1000 MCG/ML injection Inject into the muscle.     Eluxadoline (VIBERZI) 75 MG TABS Take 1 tablet by mouth 2 (two) times daily. 60 tablet 5   EPINEPHrine 0.3 mg/0.3 mL IJ SOAJ injection Inject 0.3 mg into the muscle as needed for anaphylaxis.     hydrOXYzine (ATARAX/VISTARIL) 10 MG tablet Take 10 mg by mouth 3 (three) times daily as needed for itching.     metFORMIN (GLUCOPHAGE-XR) 500 MG 24 hr tablet  Take 1,000 mg by mouth 2 (two) times daily.     metoprolol succinate (TOPROL-XL) 50 MG 24 hr tablet Take 1.5 tablets (75 mg total) by mouth 2 (two) times daily. Take with or immediately following a meal. 270 tablet 3   nortriptyline (PAMELOR) 50 MG capsule Take 50 mg by mouth at bedtime.      OXYCONTIN 10 MG 12 hr tablet Take 10 mg by mouth in the morning and at bedtime.     pantoprazole (PROTONIX) 40 MG tablet Take 40 mg by mouth daily.     SUMAtriptan (IMITREX) 100 MG tablet Take 100 mg by mouth every 2 (two) hours as needed for migraine.     SYRINGE-NEEDLE, DISP, 3 ML (MONOJECT 3CC SYR 23GX1") 23G X 1" 3 ML MISC Use 1 Syringe as directed     tamsulosin (FLOMAX) 0.4 MG CAPS capsule Take 1 capsule (0.4 mg total) by mouth daily. 30 capsule 3   testosterone cypionate  (DEPOTESTOSTERONE CYPIONATE) 200 MG/ML injection Inject 200 mg into the muscle every 7 (seven) days.     traZODone (DESYREL) 50 MG tablet Take 25 mg by mouth at bedtime as needed for sleep.      No current facility-administered medications for this visit.    PHYSICAL EXAMINATION: ECOG PERFORMANCE STATUS: 1 - Symptomatic but completely ambulatory  Vitals:   11/07/21 1319  BP: 117/81  Pulse: 82  Resp: 18  Temp: (!) 96.2 F (35.7 C)  SpO2: 99%   Filed Weights   11/07/21 1319  Weight: 159 lb (72.1 kg)    Physical Exam Constitutional:      Appearance: He is not ill-appearing.     Comments: Appears thinner than previous exam  Eyes:     General: No scleral icterus.    Conjunctiva/sclera: Conjunctivae normal.  Cardiovascular:     Rate and Rhythm: Normal rate and regular rhythm.  Abdominal:     Palpations: Abdomen is soft.     Tenderness: There is no abdominal tenderness. There is no guarding.  Musculoskeletal:        General: No deformity.     Comments: Ambulates w/o aids  Lymphadenopathy:     Cervical: No cervical adenopathy.  Skin:    General: Skin is warm and dry.     Coloration: Skin is not pale.  Neurological:     Mental Status: He is alert and oriented to person, place, and time.     Motor: No weakness.  Psychiatric:        Mood and Affect: Mood normal.        Behavior: Behavior normal.     LABORATORY DATA:  I have reviewed the data as listed Lab Results  Component Value Date   WBC 8.2 10/31/2021   HGB 16.8 10/31/2021   HCT 50.9 10/31/2021   MCV 91.2 10/31/2021   PLT 293 10/31/2021   Recent Labs    12/07/20 0940 03/28/21 1133 09/13/21 1041 10/31/21 0807  NA 136 137  --  136  K 4.9 4.2  --  3.7  CL 97 98  --  100  CO2 22 27  --  29  GLUCOSE 216* 170*  --  136*  BUN 12 8  --  10  CREATININE 1.06 0.96 1.00 1.00  CALCIUM 9.5 9.4  --  8.5*  GFRNONAA 81 >60  --  >60  GFRAA 94  --   --   --   PROT  --  7.3  --  6.1*  ALBUMIN  --  4.4  --  3.7   AST  --  31  --  17  ALT  --  40  --  20  ALKPHOS  --  52  --  39  BILITOT  --  0.3  --  0.6     RADIOGRAPHIC STUDIES: I have personally reviewed the radiological images as listed and agreed with the findings in the report. Myocardial Perfusion Imaging  Result Date: 10/19/2021   Findings are consistent with no prior ischemia. The study is low risk.   No ST deviation was noted.   LV perfusion is normal.   Left ventricular function is normal. Nuclear stress EF: 54 %. The left ventricular ejection fraction is mildly decreased (45-54%). End diastolic cavity size is normal. End systolic cavity size is normal.   Prior study not available for comparison. Normal perfusion. LVEF 54% with normal wall motion. This is a low risk study. Good exercise effort. No prior for comparison.    ASSESSMENT & PLAN:   No problem-specific Assessment & Plan notes found for this encounter.  # Iron Deficiency anemia- s/p GI workup with Dr. Artie Takayama Norris. Etiology thought to be secondary to malabsorption in setting of underlying pernicious anemia. Hemoglobin 16.8, normocytic. Ferritin 7, iron saturation 12% with normal TIBC. Proceed with venofer x3.   Pernicious anemia/B12 Deficiency- secondary to malabsorption and autoimmune etiology. Possible thyroid disease contributing to his autoimmune disease. S/p b12 injections monthly at home. B12 today is 311. Goal > 500. Increase frequency of injections from every 4 weeks to every 3 weeks. We again reviewed at length the autoimmune etiology of his anemia.   # Hypognoadism: Testosterone IMq 7 day [Dr.O'Connell]. Caution for elevated hemoglobin with testosterone use particularly as he is receiving iv iron and b12.   # Discussed at length the autoimmune etiology of patient's anemia at length; also a possible thyroid disease related to his autoimmune disease.  TSH April 2022 was normal.   # Dry eyes/dry mouth/palpitations- etiology unclear- possibly medication side effect. Negative  cardiac workup. Nortriptyline [[facility]] for chronic migraine.    # Chronic back pain-pain clinic [OxyContin/spine stimulator]-STABLE.  # Partial thyroidectomy- last TSH in April 2022 was normal. Follow up with PCP.   #Depression/Feeling 'bad'- transient suicidal ideation & depression. Reviewed need for psychiatry evaluation. Patient requests self referral. Recommend he discuss with PCP as well. He had negative CT in November 2022 which is reassuring. Reviewed ER/911/emergency evaluation if he develops plan. Patient agreeable.   IBS & weight loss- managed by Dr. Vinson Tietze Norris. He received Viberzi trial.    # DISPOSITION: Venofer x3 (does B12 at home) RTC in 8 weeks for labs and see MD (CBC CMP ferr iron studies b12 folate)  All questions were answered. The patient knows to call the clinic with any problems, questions or concerns.   [name] Au, NP 11/07/2021   CC: Dr. Ellison Hughs (fax [phone]), Dr. Rogue Bussing

## 2021-11-07 NOTE — Patient Instructions (Signed)
[facility]  Discharge Instructions: ?Thank you for choosing [facility] to provide your oncology and hematology care.  ?If you have a lab appointment with the Cancer Center, please go directly to the Cancer Center and check in at the registration area. ? ?Wear comfortable clothing and clothing appropriate for easy access to any Portacath or PICC line.  ? ?We strive to give you quality time with your provider. You may need to reschedule your appointment if you arrive late (15 or more minutes).  Arriving late affects you and other patients whose appointments are after yours.  Also, if you miss three or more appointments without notifying the office, you may be dismissed from the clinic at the provider?s discretion.    ?  ?For prescription refill requests, have your pharmacy contact our office and allow 72 hours for refills to be completed.   ? ?Today you received the following chemotherapy and/or immunotherapy agents VENOFER    ?  ?To help prevent nausea and vomiting after your treatment, we encourage you to take your nausea medication as directed. ? ?BELOW ARE SYMPTOMS THAT SHOULD BE REPORTED IMMEDIATELY: ?*FEVER GREATER THAN 100.4 F (38 ?C) OR HIGHER ?*CHILLS OR SWEATING ?*NAUSEA AND VOMITING THAT IS NOT CONTROLLED WITH YOUR NAUSEA MEDICATION ?*UNUSUAL SHORTNESS OF BREATH ?*UNUSUAL BRUISING OR BLEEDING ?*URINARY PROBLEMS (pain or burning when urinating, or frequent urination) ?*BOWEL PROBLEMS (unusual diarrhea, constipation, pain near the anus) ?TENDERNESS IN MOUTH AND THROAT WITH OR WITHOUT PRESENCE OF ULCERS (sore throat, sores in mouth, or a toothache) ?UNUSUAL RASH, SWELLING OR PAIN  ?UNUSUAL VAGINAL DISCHARGE OR ITCHING  ? ?Items with * indicate a potential emergency and should be followed up as soon as possible or go to the Emergency Department if any problems should occur. ? ?Please show the CHEMOTHERAPY ALERT CARD or IMMUNOTHERAPY ALERT CARD at check-in to the  Emergency Department and triage nurse. ? ?Should you have questions after your visit or need to cancel or reschedule your appointment, please contact [facility]  [phone] and follow the prompts.  Office hours are 8:00 a.m. to 4:30 p.m. Monday - Friday. Please note that voicemails left after 4:00 p.m. may not be returned until the following business day.  We are closed weekends and major holidays. You have access to a nurse at all times for urgent questions. Please call the main number to the clinic [phone] and follow the prompts. ? ?For any non-urgent questions, you may also contact your provider using MyChart. We now offer e-Visits for anyone 18 and older to request care online for non-urgent symptoms. For details visit [email or web address]. ?  ?Also download the MyChart app! Go to the app store, search "MyChart", open the app, select [facility], and log in with your MyChart username and password. ? ?Due to Covid, a mask is required upon entering the hospital/clinic. If you do not have a mask, one will be given to you upon arrival. For doctor visits, patients may have 1 support person aged 18 or older with them. For treatment visits, patients cannot have anyone with them due to current Covid guidelines and our immunocompromised population.  ? ?Iron Sucrose Injection ?What is this medication? ?IRON SUCROSE (EYE ern SOO krose) treats low levels of iron (iron deficiency anemia) in people with kidney disease. Iron is a mineral that plays an important role in making red blood cells, which carry oxygen from your lungs to the rest of your body. ?This medicine may   be used for other purposes; ask your health care provider or pharmacist if you have questions. ?COMMON BRAND NAME(S): Venofer ?What should I tell my care team before I take this medication? ?They need to know if you have any of these conditions: ?Anemia not caused by low iron levels ?Heart disease ?High levels of  iron in the blood ?Kidney disease ?Liver disease ?An unusual or allergic reaction to iron, other medications, foods, dyes, or preservatives ?Pregnant or trying to get pregnant ?Breast-feeding ?How should I use this medication? ?This medication is for infusion into a vein. It is given in a hospital or clinic setting. ?Talk to your care team about the use of this medication in children. While this medication may be prescribed for children as young as 2 years for selected conditions, precautions do apply. ?Overdosage: If you think you have taken too much of this medicine contact a poison control center or emergency room at once. ?NOTE: This medicine is only for you. Do not share this medicine with others. ?What if I miss a dose? ?It is important not to miss your dose. Call your care team if you are unable to keep an appointment. ?What may interact with this medication? ?Do not take this medication with any of the following: ?Deferoxamine ?Dimercaprol ?Other iron products ?This medication may also interact with the following: ?Chloramphenicol ?Deferasirox ?This list may not describe all possible interactions. Give your health care provider a list of all the medicines, herbs, non-prescription drugs, or dietary supplements you use. Also tell them if you smoke, drink alcohol, or use illegal drugs. Some items may interact with your medicine. ?What should I watch for while using this medication? ?Visit your care team regularly. Tell your care team if your symptoms do not start to get better or if they get worse. You may need blood work done while you are taking this medication. ?You may need to follow a special diet. Talk to your care team. Foods that contain iron include: whole grains/cereals, dried fruits, beans, or peas, leafy green vegetables, and organ meats (liver, kidney). ?What side effects may I notice from receiving this medication? ?Side effects that you should report to your care team as soon as  possible: ?Allergic reactions--skin rash, itching, hives, swelling of the face, lips, tongue, or throat ?Low blood pressure--dizziness, feeling faint or lightheaded, blurry vision ?Shortness of breath ?Side effects that usually do not require medical attention (report to your care team if they continue or are bothersome): ?Flushing ?Headache ?Joint pain ?Muscle pain ?Nausea ?Pain, redness, or irritation at injection site ?This list may not describe all possible side effects. Call your doctor for medical advice about side effects. You may report side effects to FDA at 1-800-FDA-1088. ?Where should I keep my medication? ?This medication is given in a hospital or clinic and will not be stored at home. ?NOTE: This sheet is a summary. It may not cover all possible information. If you have questions about this medicine, talk to your doctor, pharmacist, or health care provider. ?? 2022 Elsevier/Gold Standard (2021-03-03 00:00:00) ? ?

## 2021-11-08 DIAGNOSIS — J301 Allergic rhinitis due to pollen: Secondary | ICD-10-CM | POA: Diagnosis not present

## 2021-11-10 ENCOUNTER — Encounter: Payer: Self-pay | Admitting: Hematology and Oncology

## 2021-11-10 DIAGNOSIS — N200 Calculus of kidney: Secondary | ICD-10-CM | POA: Insufficient documentation

## 2021-11-13 ENCOUNTER — Other Ambulatory Visit: Payer: Self-pay

## 2021-11-13 ENCOUNTER — Inpatient Hospital Stay: Payer: Medicare Other

## 2021-11-13 DIAGNOSIS — D510 Vitamin B12 deficiency anemia due to intrinsic factor deficiency: Secondary | ICD-10-CM | POA: Diagnosis not present

## 2021-11-13 DIAGNOSIS — K589 Irritable bowel syndrome without diarrhea: Secondary | ICD-10-CM | POA: Diagnosis not present

## 2021-11-13 DIAGNOSIS — H04123 Dry eye syndrome of bilateral lacrimal glands: Secondary | ICD-10-CM | POA: Diagnosis not present

## 2021-11-13 DIAGNOSIS — D509 Iron deficiency anemia, unspecified: Secondary | ICD-10-CM

## 2021-11-13 DIAGNOSIS — R682 Dry mouth, unspecified: Secondary | ICD-10-CM | POA: Diagnosis not present

## 2021-11-13 DIAGNOSIS — R002 Palpitations: Secondary | ICD-10-CM | POA: Diagnosis not present

## 2021-11-13 DIAGNOSIS — M549 Dorsalgia, unspecified: Secondary | ICD-10-CM | POA: Diagnosis not present

## 2021-11-13 DIAGNOSIS — G8929 Other chronic pain: Secondary | ICD-10-CM | POA: Diagnosis not present

## 2021-11-13 DIAGNOSIS — F32A Depression, unspecified: Secondary | ICD-10-CM | POA: Diagnosis not present

## 2021-11-13 MED ORDER — IRON SUCROSE 20 MG/ML IV SOLN
200.0000 mg | Freq: Once | INTRAVENOUS | Status: AC
  Administered 2021-11-13: 200 mg via INTRAVENOUS
  Filled 2021-11-13: qty 10

## 2021-11-13 MED ORDER — SODIUM CHLORIDE 0.9 % IV SOLN: 200.0000 mg | Freq: Once | INTRAVENOUS | Status: DC

## 2021-11-13 MED ORDER — SODIUM CHLORIDE 0.9 % IV SOLN
INTRAVENOUS | Status: DC
  Filled 2021-11-13: qty 250

## 2021-11-14 DIAGNOSIS — J301 Allergic rhinitis due to pollen: Secondary | ICD-10-CM | POA: Diagnosis not present

## 2021-11-15 ENCOUNTER — Inpatient Hospital Stay: Payer: Medicare Other

## 2021-11-15 ENCOUNTER — Other Ambulatory Visit: Payer: Self-pay

## 2021-11-15 ENCOUNTER — Ambulatory Visit: Payer: 59 | Admitting: Physician Assistant

## 2021-11-15 VITALS — BP 126/84 | HR 105 | Temp 96.9°F | Resp 16

## 2021-11-15 DIAGNOSIS — K589 Irritable bowel syndrome without diarrhea: Secondary | ICD-10-CM | POA: Diagnosis not present

## 2021-11-15 DIAGNOSIS — R682 Dry mouth, unspecified: Secondary | ICD-10-CM | POA: Diagnosis not present

## 2021-11-15 DIAGNOSIS — F32A Depression, unspecified: Secondary | ICD-10-CM | POA: Diagnosis not present

## 2021-11-15 DIAGNOSIS — H04123 Dry eye syndrome of bilateral lacrimal glands: Secondary | ICD-10-CM | POA: Diagnosis not present

## 2021-11-15 DIAGNOSIS — G8929 Other chronic pain: Secondary | ICD-10-CM | POA: Diagnosis not present

## 2021-11-15 DIAGNOSIS — M549 Dorsalgia, unspecified: Secondary | ICD-10-CM | POA: Diagnosis not present

## 2021-11-15 DIAGNOSIS — D510 Vitamin B12 deficiency anemia due to intrinsic factor deficiency: Secondary | ICD-10-CM | POA: Diagnosis not present

## 2021-11-15 DIAGNOSIS — D509 Iron deficiency anemia, unspecified: Secondary | ICD-10-CM | POA: Diagnosis not present

## 2021-11-15 DIAGNOSIS — R002 Palpitations: Secondary | ICD-10-CM | POA: Diagnosis not present

## 2021-11-15 MED ORDER — SODIUM CHLORIDE 0.9 % IV SOLN
INTRAVENOUS | Status: DC
  Filled 2021-11-15: qty 250

## 2021-11-15 MED ORDER — SODIUM CHLORIDE 0.9 % IV SOLN: 200.0000 mg | Freq: Once | INTRAVENOUS | Status: DC

## 2021-11-15 MED ORDER — IRON SUCROSE 20 MG/ML IV SOLN
200.0000 mg | Freq: Once | INTRAVENOUS | Status: AC
  Administered 2021-11-15: 15:00:00 200 mg via INTRAVENOUS
  Filled 2021-11-15: qty 10

## 2021-11-20 ENCOUNTER — Other Ambulatory Visit: Payer: Self-pay

## 2021-11-20 ENCOUNTER — Inpatient Hospital Stay: Payer: Medicare Other

## 2021-11-20 VITALS — BP 116/77 | HR 90 | Temp 97.0°F | Resp 18

## 2021-11-20 DIAGNOSIS — D510 Vitamin B12 deficiency anemia due to intrinsic factor deficiency: Secondary | ICD-10-CM | POA: Diagnosis not present

## 2021-11-20 DIAGNOSIS — G8929 Other chronic pain: Secondary | ICD-10-CM | POA: Diagnosis not present

## 2021-11-20 DIAGNOSIS — R682 Dry mouth, unspecified: Secondary | ICD-10-CM | POA: Diagnosis not present

## 2021-11-20 DIAGNOSIS — H04123 Dry eye syndrome of bilateral lacrimal glands: Secondary | ICD-10-CM | POA: Diagnosis not present

## 2021-11-20 DIAGNOSIS — M549 Dorsalgia, unspecified: Secondary | ICD-10-CM | POA: Diagnosis not present

## 2021-11-20 DIAGNOSIS — F32A Depression, unspecified: Secondary | ICD-10-CM | POA: Diagnosis not present

## 2021-11-20 DIAGNOSIS — D509 Iron deficiency anemia, unspecified: Secondary | ICD-10-CM | POA: Diagnosis not present

## 2021-11-20 DIAGNOSIS — R002 Palpitations: Secondary | ICD-10-CM | POA: Diagnosis not present

## 2021-11-20 DIAGNOSIS — K589 Irritable bowel syndrome without diarrhea: Secondary | ICD-10-CM | POA: Diagnosis not present

## 2021-11-20 MED ORDER — SODIUM CHLORIDE 0.9 % IV SOLN: 200.0000 mg | Freq: Once | INTRAVENOUS | Status: DC

## 2021-11-20 MED ORDER — SODIUM CHLORIDE 0.9 % IV SOLN
Freq: Once | INTRAVENOUS | Status: AC
  Filled 2021-11-20: qty 250

## 2021-11-20 MED ORDER — IRON SUCROSE 20 MG/ML IV SOLN
200.0000 mg | Freq: Once | INTRAVENOUS | Status: AC
  Administered 2021-11-20: 200 mg via INTRAVENOUS
  Filled 2021-11-20: qty 10

## 2021-11-20 NOTE — Patient Instructions (Signed)

## 2021-12-19 ENCOUNTER — Encounter: Payer: Self-pay | Admitting: Nurse Practitioner

## 2021-12-20 DIAGNOSIS — M533 Sacrococcygeal disorders, not elsewhere classified: Secondary | ICD-10-CM | POA: Diagnosis not present

## 2021-12-20 DIAGNOSIS — M791 Myalgia, unspecified site: Secondary | ICD-10-CM | POA: Diagnosis not present

## 2021-12-20 DIAGNOSIS — Z79899 Other long term (current) drug therapy: Secondary | ICD-10-CM | POA: Diagnosis not present

## 2021-12-20 DIAGNOSIS — M5412 Radiculopathy, cervical region: Secondary | ICD-10-CM | POA: Diagnosis not present

## 2021-12-20 DIAGNOSIS — G894 Chronic pain syndrome: Secondary | ICD-10-CM | POA: Diagnosis not present

## 2021-12-20 DIAGNOSIS — M47814 Spondylosis without myelopathy or radiculopathy, thoracic region: Secondary | ICD-10-CM | POA: Diagnosis not present

## 2021-12-22 ENCOUNTER — Telehealth: Payer: Self-pay | Admitting: *Deleted

## 2021-12-22 NOTE — Telephone Encounter (Signed)
Patient called stating he never heard back form his message he sent 12/19/21 and needs someone to address his problems ? ?[name] Slipper Egner Jr. ?to [facility] (supporting Verlon Au, NP)   ?  10:14 AM ?[name], I was trying to wait until my next appointment but I am really struggling. I did not see any improvement of symptoms after the iron infusions. I am still experiencing extreme fatigue, headache, tinnitus, heart palpitations, chest pain. I feel like I?m dehydrated despite a steady intake of water (probably over 200 ounces yesterday). I have managed to gain a little weight. Mainly because I?m hungry all the time. Although, when I eat it is very uncomfortable. I wasn?t sure who to reach out to so I?m starting with you. Thanks for your time.  ?

## 2021-12-22 NOTE — Telephone Encounter (Signed)
Noted  

## 2021-12-25 ENCOUNTER — Encounter: Payer: Self-pay | Admitting: Nurse Practitioner

## 2021-12-25 ENCOUNTER — Encounter: Payer: Self-pay | Admitting: Hematology and Oncology

## 2021-12-26 ENCOUNTER — Other Ambulatory Visit: Payer: Self-pay | Admitting: *Deleted

## 2021-12-26 DIAGNOSIS — D509 Iron deficiency anemia, unspecified: Secondary | ICD-10-CM

## 2021-12-27 DIAGNOSIS — R0602 Shortness of breath: Secondary | ICD-10-CM | POA: Diagnosis not present

## 2021-12-27 DIAGNOSIS — R079 Chest pain, unspecified: Secondary | ICD-10-CM | POA: Diagnosis not present

## 2021-12-27 DIAGNOSIS — I119 Hypertensive heart disease without heart failure: Secondary | ICD-10-CM | POA: Diagnosis not present

## 2021-12-27 DIAGNOSIS — E119 Type 2 diabetes mellitus without complications: Secondary | ICD-10-CM | POA: Diagnosis not present

## 2021-12-27 DIAGNOSIS — R5383 Other fatigue: Secondary | ICD-10-CM | POA: Diagnosis not present

## 2021-12-27 DIAGNOSIS — D751 Secondary polycythemia: Secondary | ICD-10-CM | POA: Diagnosis not present

## 2021-12-27 DIAGNOSIS — R0789 Other chest pain: Secondary | ICD-10-CM | POA: Diagnosis not present

## 2021-12-27 DIAGNOSIS — Z7984 Long term (current) use of oral hypoglycemic drugs: Secondary | ICD-10-CM | POA: Diagnosis not present

## 2021-12-29 DIAGNOSIS — Z87442 Personal history of urinary calculi: Secondary | ICD-10-CM | POA: Diagnosis not present

## 2021-12-29 DIAGNOSIS — G8929 Other chronic pain: Secondary | ICD-10-CM | POA: Diagnosis not present

## 2021-12-29 DIAGNOSIS — R0609 Other forms of dyspnea: Secondary | ICD-10-CM | POA: Diagnosis not present

## 2021-12-29 DIAGNOSIS — Z978 Presence of other specified devices: Secondary | ICD-10-CM | POA: Diagnosis not present

## 2021-12-29 DIAGNOSIS — Z8249 Family history of ischemic heart disease and other diseases of the circulatory system: Secondary | ICD-10-CM | POA: Diagnosis not present

## 2021-12-29 DIAGNOSIS — R5383 Other fatigue: Secondary | ICD-10-CM | POA: Diagnosis not present

## 2021-12-29 DIAGNOSIS — E119 Type 2 diabetes mellitus without complications: Secondary | ICD-10-CM | POA: Diagnosis not present

## 2021-12-29 DIAGNOSIS — R0789 Other chest pain: Secondary | ICD-10-CM | POA: Diagnosis not present

## 2021-12-29 DIAGNOSIS — I152 Hypertension secondary to endocrine disorders: Secondary | ICD-10-CM | POA: Diagnosis not present

## 2021-12-29 DIAGNOSIS — Z79899 Other long term (current) drug therapy: Secondary | ICD-10-CM | POA: Diagnosis not present

## 2021-12-29 DIAGNOSIS — E1159 Type 2 diabetes mellitus with other circulatory complications: Secondary | ICD-10-CM | POA: Diagnosis not present

## 2021-12-29 DIAGNOSIS — I10 Essential (primary) hypertension: Secondary | ICD-10-CM | POA: Diagnosis not present

## 2021-12-29 DIAGNOSIS — D649 Anemia, unspecified: Secondary | ICD-10-CM | POA: Diagnosis not present

## 2022-01-02 ENCOUNTER — Inpatient Hospital Stay (HOSPITAL_BASED_OUTPATIENT_CLINIC_OR_DEPARTMENT_OTHER): Payer: Medicare Other | Admitting: Nurse Practitioner

## 2022-01-02 ENCOUNTER — Inpatient Hospital Stay: Payer: Medicare Other | Attending: Internal Medicine

## 2022-01-02 ENCOUNTER — Other Ambulatory Visit: Payer: Self-pay

## 2022-01-02 ENCOUNTER — Encounter: Payer: Self-pay | Admitting: Nurse Practitioner

## 2022-01-02 VITALS — BP 130/84 | HR 80 | Temp 96.7°F | Wt 171.0 lb

## 2022-01-02 DIAGNOSIS — K589 Irritable bowel syndrome without diarrhea: Secondary | ICD-10-CM | POA: Diagnosis not present

## 2022-01-02 DIAGNOSIS — D519 Vitamin B12 deficiency anemia, unspecified: Secondary | ICD-10-CM

## 2022-01-02 DIAGNOSIS — E119 Type 2 diabetes mellitus without complications: Secondary | ICD-10-CM | POA: Diagnosis not present

## 2022-01-02 DIAGNOSIS — G8929 Other chronic pain: Secondary | ICD-10-CM | POA: Insufficient documentation

## 2022-01-02 DIAGNOSIS — D510 Vitamin B12 deficiency anemia due to intrinsic factor deficiency: Secondary | ICD-10-CM

## 2022-01-02 DIAGNOSIS — I10 Essential (primary) hypertension: Secondary | ICD-10-CM | POA: Diagnosis not present

## 2022-01-02 DIAGNOSIS — R682 Dry mouth, unspecified: Secondary | ICD-10-CM | POA: Diagnosis not present

## 2022-01-02 DIAGNOSIS — R002 Palpitations: Secondary | ICD-10-CM | POA: Insufficient documentation

## 2022-01-02 DIAGNOSIS — F32A Depression, unspecified: Secondary | ICD-10-CM | POA: Insufficient documentation

## 2022-01-02 DIAGNOSIS — M549 Dorsalgia, unspecified: Secondary | ICD-10-CM | POA: Diagnosis not present

## 2022-01-02 DIAGNOSIS — D509 Iron deficiency anemia, unspecified: Secondary | ICD-10-CM | POA: Diagnosis not present

## 2022-01-02 DIAGNOSIS — R6889 Other general symptoms and signs: Secondary | ICD-10-CM | POA: Diagnosis not present

## 2022-01-02 DIAGNOSIS — R634 Abnormal weight loss: Secondary | ICD-10-CM | POA: Insufficient documentation

## 2022-01-02 DIAGNOSIS — R45851 Suicidal ideations: Secondary | ICD-10-CM | POA: Insufficient documentation

## 2022-01-02 LAB — CBC WITH DIFFERENTIAL/PLATELET
Abs Immature Granulocytes: 0.09 10*3/uL — ABNORMAL HIGH (ref 0.00–0.07)
Basophils Absolute: 0.1 10*3/uL (ref 0.0–0.1)
Basophils Relative: 1 %
Eosinophils Absolute: 0.2 10*3/uL (ref 0.0–0.5)
Eosinophils Relative: 2 %
HCT: 55.6 % — ABNORMAL HIGH (ref 39.0–52.0)
Hemoglobin: 19 g/dL — ABNORMAL HIGH (ref 13.0–17.0)
Immature Granulocytes: 1 %
Lymphocytes Relative: 27 %
Lymphs Abs: 2.1 10*3/uL (ref 0.7–4.0)
MCH: 31.4 pg (ref 26.0–34.0)
MCHC: 34.2 g/dL (ref 30.0–36.0)
MCV: 91.9 fL (ref 80.0–100.0)
Monocytes Absolute: 0.6 10*3/uL (ref 0.1–1.0)
Monocytes Relative: 8 %
Neutro Abs: 4.9 10*3/uL (ref 1.7–7.7)
Neutrophils Relative %: 61 %
Platelets: 244 10*3/uL (ref 150–400)
RBC: 6.05 MIL/uL — ABNORMAL HIGH (ref 4.22–5.81)
RDW: 18.2 % — ABNORMAL HIGH (ref 11.5–15.5)
WBC: 8 10*3/uL (ref 4.0–10.5)
nRBC: 0 % (ref 0.0–0.2)

## 2022-01-02 LAB — COMPREHENSIVE METABOLIC PANEL
ALT: 28 U/L (ref 0–44)
AST: 22 U/L (ref 15–41)
Albumin: 4.1 g/dL (ref 3.5–5.0)
Alkaline Phosphatase: 51 U/L (ref 38–126)
Anion gap: 9 (ref 5–15)
BUN: 14 mg/dL (ref 6–20)
CO2: 29 mmol/L (ref 22–32)
Calcium: 9.2 mg/dL (ref 8.9–10.3)
Chloride: 99 mmol/L (ref 98–111)
Creatinine, Ser: 1.11 mg/dL (ref 0.61–1.24)
GFR, Estimated: >60 mL/min (ref >60)
Glucose, Bld: 191 mg/dL — ABNORMAL HIGH (ref 70–99)
Potassium: 4.7 mmol/L (ref 3.5–5.1)
Sodium: 137 mmol/L (ref 135–145)
Total Bilirubin: 0.5 mg/dL (ref 0.3–1.2)
Total Protein: 6.8 g/dL (ref 6.5–8.1)

## 2022-01-02 LAB — VITAMIN B12: Vitamin B-12: 564 pg/mL (ref 180–914)

## 2022-01-02 LAB — FERRITIN: Ferritin: 38 ng/mL (ref 24–336)

## 2022-01-02 LAB — IRON AND TIBC
Iron: 130 ug/dL (ref 45–182)
Saturation Ratios: 31 % (ref 17.9–39.5)
TIBC: 419 ug/dL (ref 250–450)
UIBC: 289 ug/dL

## 2022-01-02 LAB — FOLATE: Folate: 13.2 ng/mL (ref >5.9)

## 2022-01-02 NOTE — Progress Notes (Signed)
Patient here for follow up. Patient complains of headaches and cramping sensation in chest that he has been seen for.  ?

## 2022-01-02 NOTE — Progress Notes (Signed)
[facility] ?CONSULT NOTE ? ?Patient Care Team: ?Sofie Hartigan, MD as PCP - General (Family Medicine) ?Sherren Mocha, MD as PCP - Cardiology (Cardiology) ?Lequita Asal, MD (Inactive) as Referring Physician (Hematology and Oncology) ?Cammie Sickle, MD as Consulting Physician (Hematology and Oncology) ? ?CHIEF COMPLAINTS/PURPOSE OF CONSULTATION:  ? ?#Pernicious anemia [March 2022-palpitations cardiology Dr. Burt Knack work-up]-iron deficiency  B12deficiency; intrinsic factor antibodies- s/p B12- IV venofer; GI ? ?#Hypogonadism-testosterone supplementation/partial thyroidectomy [benign "tumor"]-Dr. Honor Junes ? ?#Chronic back pain/pain clinic-OxyContin; stimulator ? ?Oncology History  ? No history exists.  ? ? ?HISTORY OF PRESENTING ILLNESS:  ?[name] Brown. 51 y.o.  male with above history of B12 deficiency/iron deficiency secondary to pernicious anemia due for follow-up. Continues to report intermittent palpitations and ongoing fatigue. Loss of interest in activities. Weight is up. Says he briefly felt better after taking iv iron but then symptoms returns. Feels that there is something underlying that is being missed. Accompanied by his son who contributes ot history.  ? ?Review of Systems  ?Constitutional:  Positive for malaise/fatigue. Negative for chills, diaphoresis, fever and weight loss.  ?HENT:  Negative for nosebleeds and sore throat.   ?Eyes:  Negative for double vision.  ?Respiratory:  Negative for cough, hemoptysis, sputum production, shortness of breath and wheezing.   ?Cardiovascular:  Positive for palpitations. Negative for chest pain, orthopnea and leg swelling.  ?Gastrointestinal:  Negative for abdominal pain, blood in stool, constipation, diarrhea, heartburn, melena, nausea and vomiting.  ?Genitourinary:  Negative for dysuria, frequency and urgency.  ?Musculoskeletal:  Positive for back pain. Negative for joint pain and myalgias.  ?Skin: Negative.  Negative for  itching and rash.  ?Neurological:  Positive for headaches. Negative for tingling, focal weakness and weakness.  ?Endo/Heme/Allergies:  Does not bruise/bleed easily.  ?Psychiatric/Behavioral:  Positive for depression. Negative for hallucinations. The patient is nervous/anxious. The patient does not have insomnia.    ? ?MEDICAL HISTORY:  ?Past Medical History:  ?Diagnosis Date  ? CAD (coronary artery disease)   ? Chronic pain   ? Diabetes mellitus without complication ([place])   ? DJD (degenerative joint disease)   ? GERD (gastroesophageal reflux disease)   ? Headache   ? Hyperlipidemia   ? Hypertension   ? Hypogonadism male   ? Kidney stones   ? ? ?SURGICAL HISTORY: ?Past Surgical History:  ?Procedure Laterality Date  ? BICEPT TENODESIS Left 12/30/2019  ? Procedure: MINI BICEPS TENODESIS;  Surgeon: Corky Mull, MD;  Location: [facility] ORS;  Service: Orthopedics;  Laterality: Left;  ? COLONOSCOPY WITH PROPOFOL N/A 01/05/2019  ? Procedure: COLONOSCOPY WITH PROPOFOL;  Surgeon: Lollie Sails, MD;  Location: [facility] ENDOSCOPY;  Service: Endoscopy;  Laterality: N/A;  ? ESOPHAGOGASTRODUODENOSCOPY (EGD) WITH PROPOFOL N/A 01/05/2019  ? Procedure: ESOPHAGOGASTRODUODENOSCOPY (EGD) WITH PROPOFOL;  Surgeon: Lollie Sails, MD;  Location: [facility] ENDOSCOPY;  Service: Endoscopy;  Laterality: N/A;  ? EXTRACORPOREAL SHOCK WAVE LITHOTRIPSY Right 05/14/2019  ? Procedure: EXTRACORPOREAL SHOCK WAVE LITHOTRIPSY (ESWL);  Surgeon: [name], MD;  Location: [facility] ORS;  Service: Urology;  Laterality: Right;  ? GIVENS CAPSULE STUDY N/A 04/10/2021  ? Procedure: GIVENS CAPSULE STUDY;  Surgeon: Lucilla Lame, MD;  Location: [facility] ENDOSCOPY;  Service: Endoscopy;  Laterality: N/A;  ? KNEE ARTHROSCOPY    ? KNEE ARTHROSCOPY WITH MENISCAL REPAIR Right 01/19/2016  ? Procedure: KNEE ARTHROSCOPY WITH MENISCAL REPAIR;  Surgeon: Corky Mull, MD;  Location: [facility] ORS;  Service: Orthopedics;  Laterality: Right;  ? KNEE ARTHROSCOPY WITH MENISCAL  REPAIR Right  06/12/2016  ? Procedure: KNEE ARTHROSCOPY WITH PARTIAL MEDIAL MENISCAL REPAIR;  Surgeon: Corky Mull, MD;  Location: [facility] ORS;  Service: Orthopedics;  Laterality: Right;  ? PARATHYROIDECTOMY    ? SHOULDER ARTHROSCOPY WITH ROTATOR CUFF REPAIR Left 12/30/2019  ? Procedure: SHOULDER ARTHROSCOPY WITH ROTATOR CUFF REPAIR;  Surgeon: Corky Mull, MD;  Location: [facility] ORS;  Service: Orthopedics;  Laterality: Left;  ? SHOULDER SURGERY Bilateral   ? SPINAL CORD STIMULATOR BATTERY EXCHANGE    ? SPINAL CORD STIMULATOR IMPLANT  2008  ? SPINAL FUSION    ? L4-L5 (2004), L5-S1 (2006)  ? THYROIDECTOMY, PARTIAL    ? TONSILLECTOMY    ? ? ?SOCIAL HISTORY: ?Social History  ? ?Socioeconomic History  ? Marital status: Married  ?  Spouse name: Not on file  ? Number of children: Not on file  ? Years of education: Not on file  ? Highest education level: Not on file  ?Occupational History  ? Not on file  ?Tobacco Use  ? Smoking status: Never  ? Smokeless tobacco: Never  ?Vaping Use  ? Vaping Use: Never used  ?Substance and Sexual Activity  ? Alcohol use: No  ? Drug use: No  ? Sexual activity: Yes  ?  Birth control/protection: None  ?Other Topics Concern  ? Not on file  ?Social History Narrative  ? Not on file  ? ?Social Determinants of Health  ? ?Financial Resource Strain: Not on file  ?Food Insecurity: Not on file  ?Transportation Needs: Not on file  ?Physical Activity: Not on file  ?Stress: Not on file  ?Social Connections: Not on file  ?Intimate Partner Violence: Not on file  ? ? ?FAMILY HISTORY: ?Family History  ?Problem Relation Age of Onset  ? Bladder Cancer Neg Hx   ? Kidney cancer Neg Hx   ? Prostate cancer Neg Hx   ? ? ?ALLERGIES:  is allergic to pravastatin and celexa [citalopram]. ? ?MEDICATIONS:  ?Current Outpatient Medications  ?Medication Sig Dispense Refill  ? ACCU-CHEK GUIDE test strip     ? Accu-Chek Softclix Lancets lancets SMARTSIG:Topical    ? acyclovir (ZOVIRAX) 200 MG capsule Take 200 mg by mouth 2 (two) times daily as  needed (fever blisters).     ? Atogepant (QULIPTA) 60 MG TABS Take by mouth.    ? atorvastatin (LIPITOR) 10 MG tablet Take 1 tablet by mouth daily.    ? Blood Glucose Monitoring Suppl (ACCU-CHEK GUIDE) w/Device KIT     ? cholecalciferol (VITAMIN D) 1000 units tablet Take 1,000 Units by mouth daily.    ? cyanocobalamin (,VITAMIN B-12,) 1000 MCG/ML injection Inject into the muscle.    ? EPINEPHrine 0.3 mg/0.3 mL IJ SOAJ injection Inject 0.3 mg into the muscle as needed for anaphylaxis.    ? glipiZIDE (GLUCOTROL XL) 2.5 MG 24 hr tablet Take 2.5 mg by mouth daily.    ? lisinopril (ZESTRIL) 2.5 MG tablet Take by mouth.    ? metFORMIN (GLUCOPHAGE-XR) 500 MG 24 hr tablet Take 1,000 mg by mouth 2 (two) times daily.    ? metoprolol succinate (TOPROL-XL) 50 MG 24 hr tablet Take 1.5 tablets (75 mg total) by mouth 2 (two) times daily. Take with or immediately following a meal. 270 tablet 3  ? OXYCONTIN 10 MG 12 hr tablet Take 10 mg by mouth in the morning and at bedtime.    ? pantoprazole (PROTONIX) 40 MG tablet Take 40 mg by mouth daily.    ? SYRINGE-NEEDLE, DISP,  3 ML (MONOJECT 3CC SYR 23GX1") 23G X 1" 3 ML MISC Use 1 Syringe as directed    ? tamsulosin (FLOMAX) 0.4 MG CAPS capsule Take 1 capsule (0.4 mg total) by mouth daily. 30 capsule 3  ? testosterone cypionate (DEPOTESTOSTERONE CYPIONATE) 200 MG/ML injection Inject 200 mg into the muscle every 7 (seven) days.    ? traZODone (DESYREL) 50 MG tablet Take 25 mg by mouth at bedtime as needed for sleep.     ? atorvastatin (LIPITOR) 40 MG tablet TAKE 1 TABLET BY MOUTH EVERY DAY (Patient not taking: Reported on 01/02/2022) 90 tablet 3  ? cefdinir (OMNICEF) 300 MG capsule Take 300 mg by mouth 2 (two) times daily. (Patient not taking: Reported on 08/25/2021)    ? Eluxadoline (VIBERZI) 75 MG TABS Take 1 tablet by mouth 2 (two) times daily. (Patient not taking: Reported on 11/07/2021) 60 tablet 5  ? HYDROmorphone (DILAUDID) 4 MG tablet Take 4 mg by mouth daily as needed. (Patient not  taking: Reported on 11/07/2021)    ? hydrOXYzine (ATARAX/VISTARIL) 10 MG tablet Take 10 mg by mouth 3 (three) times daily as needed for itching. (Patient not taking: Reported on 11/07/2021)    ? nortriptyline (

## 2022-01-03 ENCOUNTER — Encounter: Payer: Self-pay | Admitting: Nurse Practitioner

## 2022-01-04 DIAGNOSIS — I152 Hypertension secondary to endocrine disorders: Secondary | ICD-10-CM | POA: Diagnosis not present

## 2022-01-04 DIAGNOSIS — I119 Hypertensive heart disease without heart failure: Secondary | ICD-10-CM | POA: Diagnosis not present

## 2022-01-04 DIAGNOSIS — R0609 Other forms of dyspnea: Secondary | ICD-10-CM | POA: Diagnosis not present

## 2022-01-04 DIAGNOSIS — E1159 Type 2 diabetes mellitus with other circulatory complications: Secondary | ICD-10-CM | POA: Diagnosis not present

## 2022-01-08 ENCOUNTER — Encounter: Payer: Self-pay | Admitting: Hematology and Oncology

## 2022-01-10 ENCOUNTER — Telehealth: Payer: Self-pay | Admitting: Nurse Practitioner

## 2022-01-10 NOTE — Telephone Encounter (Signed)
Spoke to patient by phone to follow up on mychart message. Do not anticipate being able to optimize ferritin level to 50-100 given concurrent use of testosterone. Ferritin currently 38, hemoglobin > 19/HCT 55. Patient verbalizes understanding. Follow up as planned.  ?

## 2022-01-12 DIAGNOSIS — E538 Deficiency of other specified B group vitamins: Secondary | ICD-10-CM | POA: Diagnosis not present

## 2022-01-12 DIAGNOSIS — D510 Vitamin B12 deficiency anemia due to intrinsic factor deficiency: Secondary | ICD-10-CM | POA: Diagnosis not present

## 2022-01-12 DIAGNOSIS — D500 Iron deficiency anemia secondary to blood loss (chronic): Secondary | ICD-10-CM | POA: Diagnosis not present

## 2022-01-12 DIAGNOSIS — Z79890 Hormone replacement therapy (postmenopausal): Secondary | ICD-10-CM | POA: Diagnosis not present

## 2022-01-12 DIAGNOSIS — D751 Secondary polycythemia: Secondary | ICD-10-CM | POA: Diagnosis not present

## 2022-01-17 DIAGNOSIS — E1169 Type 2 diabetes mellitus with other specified complication: Secondary | ICD-10-CM | POA: Diagnosis not present

## 2022-01-17 DIAGNOSIS — K219 Gastro-esophageal reflux disease without esophagitis: Secondary | ICD-10-CM | POA: Diagnosis not present

## 2022-01-17 DIAGNOSIS — G8921 Chronic pain due to trauma: Secondary | ICD-10-CM | POA: Diagnosis not present

## 2022-01-17 DIAGNOSIS — R4586 Emotional lability: Secondary | ICD-10-CM | POA: Diagnosis not present

## 2022-01-17 DIAGNOSIS — G4701 Insomnia due to medical condition: Secondary | ICD-10-CM | POA: Diagnosis not present

## 2022-01-17 DIAGNOSIS — E538 Deficiency of other specified B group vitamins: Secondary | ICD-10-CM | POA: Diagnosis not present

## 2022-01-17 DIAGNOSIS — E221 Hyperprolactinemia: Secondary | ICD-10-CM | POA: Diagnosis not present

## 2022-01-17 DIAGNOSIS — E291 Testicular hypofunction: Secondary | ICD-10-CM | POA: Diagnosis not present

## 2022-01-17 DIAGNOSIS — M2550 Pain in unspecified joint: Secondary | ICD-10-CM | POA: Diagnosis not present

## 2022-01-17 DIAGNOSIS — E785 Hyperlipidemia, unspecified: Secondary | ICD-10-CM | POA: Diagnosis not present

## 2022-02-05 DIAGNOSIS — D751 Secondary polycythemia: Secondary | ICD-10-CM | POA: Diagnosis not present

## 2022-02-05 DIAGNOSIS — I152 Hypertension secondary to endocrine disorders: Secondary | ICD-10-CM | POA: Diagnosis not present

## 2022-02-05 DIAGNOSIS — E291 Testicular hypofunction: Secondary | ICD-10-CM | POA: Diagnosis not present

## 2022-02-05 DIAGNOSIS — E611 Iron deficiency: Secondary | ICD-10-CM | POA: Diagnosis not present

## 2022-02-05 DIAGNOSIS — E1159 Type 2 diabetes mellitus with other circulatory complications: Secondary | ICD-10-CM | POA: Diagnosis not present

## 2022-02-05 DIAGNOSIS — E1169 Type 2 diabetes mellitus with other specified complication: Secondary | ICD-10-CM | POA: Diagnosis not present

## 2022-02-05 DIAGNOSIS — E785 Hyperlipidemia, unspecified: Secondary | ICD-10-CM | POA: Diagnosis not present

## 2022-02-07 DIAGNOSIS — G43719 Chronic migraine without aura, intractable, without status migrainosus: Secondary | ICD-10-CM | POA: Diagnosis not present

## 2022-02-13 DIAGNOSIS — E113293 Type 2 diabetes mellitus with mild nonproliferative diabetic retinopathy without macular edema, bilateral: Secondary | ICD-10-CM | POA: Diagnosis not present

## 2022-02-26 DIAGNOSIS — F5105 Insomnia due to other mental disorder: Secondary | ICD-10-CM | POA: Diagnosis not present

## 2022-02-26 DIAGNOSIS — J301 Allergic rhinitis due to pollen: Secondary | ICD-10-CM | POA: Diagnosis not present

## 2022-02-26 DIAGNOSIS — F322 Major depressive disorder, single episode, severe without psychotic features: Secondary | ICD-10-CM | POA: Diagnosis not present

## 2022-02-26 DIAGNOSIS — F419 Anxiety disorder, unspecified: Secondary | ICD-10-CM | POA: Diagnosis not present

## 2022-03-05 DIAGNOSIS — M533 Sacrococcygeal disorders, not elsewhere classified: Secondary | ICD-10-CM | POA: Diagnosis not present

## 2022-03-05 DIAGNOSIS — Z79899 Other long term (current) drug therapy: Secondary | ICD-10-CM | POA: Diagnosis not present

## 2022-03-05 DIAGNOSIS — F419 Anxiety disorder, unspecified: Secondary | ICD-10-CM | POA: Diagnosis not present

## 2022-03-05 DIAGNOSIS — M791 Myalgia, unspecified site: Secondary | ICD-10-CM | POA: Diagnosis not present

## 2022-03-05 DIAGNOSIS — M5412 Radiculopathy, cervical region: Secondary | ICD-10-CM | POA: Diagnosis not present

## 2022-03-05 DIAGNOSIS — G894 Chronic pain syndrome: Secondary | ICD-10-CM | POA: Diagnosis not present

## 2022-03-05 DIAGNOSIS — M47814 Spondylosis without myelopathy or radiculopathy, thoracic region: Secondary | ICD-10-CM | POA: Diagnosis not present

## 2022-03-06 DIAGNOSIS — J301 Allergic rhinitis due to pollen: Secondary | ICD-10-CM | POA: Diagnosis not present

## 2022-03-07 DIAGNOSIS — J301 Allergic rhinitis due to pollen: Secondary | ICD-10-CM | POA: Diagnosis not present

## 2022-03-08 DIAGNOSIS — E291 Testicular hypofunction: Secondary | ICD-10-CM | POA: Diagnosis not present

## 2022-03-08 DIAGNOSIS — E785 Hyperlipidemia, unspecified: Secondary | ICD-10-CM | POA: Diagnosis not present

## 2022-03-08 DIAGNOSIS — Z9682 Presence of neurostimulator: Secondary | ICD-10-CM | POA: Diagnosis not present

## 2022-03-08 DIAGNOSIS — E1169 Type 2 diabetes mellitus with other specified complication: Secondary | ICD-10-CM | POA: Diagnosis not present

## 2022-03-08 DIAGNOSIS — F32A Depression, unspecified: Secondary | ICD-10-CM | POA: Diagnosis not present

## 2022-03-08 DIAGNOSIS — G43909 Migraine, unspecified, not intractable, without status migrainosus: Secondary | ICD-10-CM | POA: Diagnosis not present

## 2022-03-08 DIAGNOSIS — I152 Hypertension secondary to endocrine disorders: Secondary | ICD-10-CM | POA: Diagnosis not present

## 2022-03-08 DIAGNOSIS — Z7984 Long term (current) use of oral hypoglycemic drugs: Secondary | ICD-10-CM | POA: Diagnosis not present

## 2022-03-08 DIAGNOSIS — I609 Nontraumatic subarachnoid hemorrhage, unspecified: Secondary | ICD-10-CM | POA: Diagnosis not present

## 2022-03-08 DIAGNOSIS — M5416 Radiculopathy, lumbar region: Secondary | ICD-10-CM | POA: Diagnosis not present

## 2022-03-08 DIAGNOSIS — G8929 Other chronic pain: Secondary | ICD-10-CM | POA: Diagnosis not present

## 2022-03-08 DIAGNOSIS — K219 Gastro-esophageal reflux disease without esophagitis: Secondary | ICD-10-CM | POA: Diagnosis not present

## 2022-03-08 DIAGNOSIS — E538 Deficiency of other specified B group vitamins: Secondary | ICD-10-CM | POA: Diagnosis not present

## 2022-03-08 DIAGNOSIS — R519 Headache, unspecified: Secondary | ICD-10-CM | POA: Diagnosis not present

## 2022-03-08 DIAGNOSIS — D72829 Elevated white blood cell count, unspecified: Secondary | ICD-10-CM | POA: Diagnosis not present

## 2022-03-08 DIAGNOSIS — I663 Occlusion and stenosis of cerebellar arteries: Secondary | ICD-10-CM | POA: Diagnosis not present

## 2022-03-08 DIAGNOSIS — E119 Type 2 diabetes mellitus without complications: Secondary | ICD-10-CM | POA: Diagnosis not present

## 2022-03-08 DIAGNOSIS — G4453 Primary thunderclap headache: Secondary | ICD-10-CM | POA: Diagnosis not present

## 2022-03-08 DIAGNOSIS — I608 Other nontraumatic subarachnoid hemorrhage: Secondary | ICD-10-CM | POA: Diagnosis not present

## 2022-03-14 DIAGNOSIS — Z79899 Other long term (current) drug therapy: Secondary | ICD-10-CM | POA: Diagnosis not present

## 2022-03-14 DIAGNOSIS — R519 Headache, unspecified: Secondary | ICD-10-CM | POA: Diagnosis not present

## 2022-03-14 DIAGNOSIS — E785 Hyperlipidemia, unspecified: Secondary | ICD-10-CM | POA: Diagnosis not present

## 2022-03-14 DIAGNOSIS — Z7984 Long term (current) use of oral hypoglycemic drugs: Secondary | ICD-10-CM | POA: Diagnosis not present

## 2022-03-14 DIAGNOSIS — G8929 Other chronic pain: Secondary | ICD-10-CM | POA: Diagnosis not present

## 2022-03-14 DIAGNOSIS — E119 Type 2 diabetes mellitus without complications: Secondary | ICD-10-CM | POA: Diagnosis not present

## 2022-03-14 DIAGNOSIS — I609 Nontraumatic subarachnoid hemorrhage, unspecified: Secondary | ICD-10-CM | POA: Diagnosis not present

## 2022-03-14 DIAGNOSIS — I10 Essential (primary) hypertension: Secondary | ICD-10-CM | POA: Diagnosis not present

## 2022-03-27 ENCOUNTER — Inpatient Hospital Stay: Payer: Medicare Other

## 2022-03-28 DIAGNOSIS — M533 Sacrococcygeal disorders, not elsewhere classified: Secondary | ICD-10-CM | POA: Diagnosis not present

## 2022-03-28 DIAGNOSIS — Z79899 Other long term (current) drug therapy: Secondary | ICD-10-CM | POA: Diagnosis not present

## 2022-03-28 DIAGNOSIS — F419 Anxiety disorder, unspecified: Secondary | ICD-10-CM | POA: Diagnosis not present

## 2022-03-28 DIAGNOSIS — M25552 Pain in left hip: Secondary | ICD-10-CM | POA: Diagnosis not present

## 2022-03-28 DIAGNOSIS — M791 Myalgia, unspecified site: Secondary | ICD-10-CM | POA: Diagnosis not present

## 2022-03-28 DIAGNOSIS — M5412 Radiculopathy, cervical region: Secondary | ICD-10-CM | POA: Diagnosis not present

## 2022-03-28 DIAGNOSIS — G894 Chronic pain syndrome: Secondary | ICD-10-CM | POA: Diagnosis not present

## 2022-03-28 DIAGNOSIS — M47814 Spondylosis without myelopathy or radiculopathy, thoracic region: Secondary | ICD-10-CM | POA: Diagnosis not present

## 2022-04-02 DIAGNOSIS — E611 Iron deficiency: Secondary | ICD-10-CM | POA: Diagnosis not present

## 2022-04-02 DIAGNOSIS — E538 Deficiency of other specified B group vitamins: Secondary | ICD-10-CM | POA: Diagnosis not present

## 2022-04-02 DIAGNOSIS — D751 Secondary polycythemia: Secondary | ICD-10-CM | POA: Diagnosis not present

## 2022-04-03 ENCOUNTER — Inpatient Hospital Stay: Payer: Medicare Other | Admitting: Internal Medicine

## 2022-04-10 DIAGNOSIS — G4700 Insomnia, unspecified: Secondary | ICD-10-CM | POA: Diagnosis not present

## 2022-04-10 DIAGNOSIS — F5105 Insomnia due to other mental disorder: Secondary | ICD-10-CM | POA: Diagnosis not present

## 2022-04-10 DIAGNOSIS — F322 Major depressive disorder, single episode, severe without psychotic features: Secondary | ICD-10-CM | POA: Diagnosis not present

## 2022-04-10 DIAGNOSIS — Z09 Encounter for follow-up examination after completed treatment for conditions other than malignant neoplasm: Secondary | ICD-10-CM | POA: Diagnosis not present

## 2022-04-10 DIAGNOSIS — Z8679 Personal history of other diseases of the circulatory system: Secondary | ICD-10-CM | POA: Diagnosis not present

## 2022-04-10 DIAGNOSIS — F419 Anxiety disorder, unspecified: Secondary | ICD-10-CM | POA: Diagnosis not present

## 2022-04-10 DIAGNOSIS — I609 Nontraumatic subarachnoid hemorrhage, unspecified: Secondary | ICD-10-CM | POA: Diagnosis not present

## 2022-04-25 DIAGNOSIS — R4586 Emotional lability: Secondary | ICD-10-CM | POA: Diagnosis not present

## 2022-04-25 DIAGNOSIS — E291 Testicular hypofunction: Secondary | ICD-10-CM | POA: Diagnosis not present

## 2022-04-25 DIAGNOSIS — F5105 Insomnia due to other mental disorder: Secondary | ICD-10-CM | POA: Diagnosis not present

## 2022-04-25 DIAGNOSIS — E221 Hyperprolactinemia: Secondary | ICD-10-CM | POA: Diagnosis not present

## 2022-04-25 DIAGNOSIS — K219 Gastro-esophageal reflux disease without esophagitis: Secondary | ICD-10-CM | POA: Diagnosis not present

## 2022-04-25 DIAGNOSIS — F322 Major depressive disorder, single episode, severe without psychotic features: Secondary | ICD-10-CM | POA: Diagnosis not present

## 2022-04-25 DIAGNOSIS — E892 Postprocedural hypoparathyroidism: Secondary | ICD-10-CM | POA: Diagnosis not present

## 2022-04-25 DIAGNOSIS — G4701 Insomnia due to medical condition: Secondary | ICD-10-CM | POA: Diagnosis not present

## 2022-04-25 DIAGNOSIS — E1169 Type 2 diabetes mellitus with other specified complication: Secondary | ICD-10-CM | POA: Diagnosis not present

## 2022-04-25 DIAGNOSIS — G8921 Chronic pain due to trauma: Secondary | ICD-10-CM | POA: Diagnosis not present

## 2022-04-25 DIAGNOSIS — E538 Deficiency of other specified B group vitamins: Secondary | ICD-10-CM | POA: Diagnosis not present

## 2022-04-25 DIAGNOSIS — M2550 Pain in unspecified joint: Secondary | ICD-10-CM | POA: Diagnosis not present

## 2022-04-25 DIAGNOSIS — E785 Hyperlipidemia, unspecified: Secondary | ICD-10-CM | POA: Diagnosis not present

## 2022-04-25 DIAGNOSIS — F419 Anxiety disorder, unspecified: Secondary | ICD-10-CM | POA: Diagnosis not present

## 2022-05-15 DIAGNOSIS — G43719 Chronic migraine without aura, intractable, without status migrainosus: Secondary | ICD-10-CM | POA: Diagnosis not present

## 2022-05-30 DIAGNOSIS — J301 Allergic rhinitis due to pollen: Secondary | ICD-10-CM | POA: Diagnosis not present

## 2022-06-04 DIAGNOSIS — G894 Chronic pain syndrome: Secondary | ICD-10-CM | POA: Diagnosis not present

## 2022-06-04 DIAGNOSIS — M5412 Radiculopathy, cervical region: Secondary | ICD-10-CM | POA: Diagnosis not present

## 2022-06-04 DIAGNOSIS — M533 Sacrococcygeal disorders, not elsewhere classified: Secondary | ICD-10-CM | POA: Diagnosis not present

## 2022-06-04 DIAGNOSIS — Z79899 Other long term (current) drug therapy: Secondary | ICD-10-CM | POA: Diagnosis not present

## 2022-06-04 DIAGNOSIS — M47814 Spondylosis without myelopathy or radiculopathy, thoracic region: Secondary | ICD-10-CM | POA: Diagnosis not present

## 2022-06-04 DIAGNOSIS — M791 Myalgia, unspecified site: Secondary | ICD-10-CM | POA: Diagnosis not present

## 2022-06-04 DIAGNOSIS — F419 Anxiety disorder, unspecified: Secondary | ICD-10-CM | POA: Diagnosis not present

## 2022-06-04 DIAGNOSIS — K5900 Constipation, unspecified: Secondary | ICD-10-CM | POA: Diagnosis not present

## 2022-06-05 DIAGNOSIS — F322 Major depressive disorder, single episode, severe without psychotic features: Secondary | ICD-10-CM | POA: Diagnosis not present

## 2022-06-05 DIAGNOSIS — F419 Anxiety disorder, unspecified: Secondary | ICD-10-CM | POA: Diagnosis not present

## 2022-06-05 DIAGNOSIS — F5105 Insomnia due to other mental disorder: Secondary | ICD-10-CM | POA: Diagnosis not present

## 2022-06-05 DIAGNOSIS — J301 Allergic rhinitis due to pollen: Secondary | ICD-10-CM | POA: Diagnosis not present

## 2022-06-12 ENCOUNTER — Ambulatory Visit: Payer: Medicare Other | Admitting: Urology

## 2022-06-12 DIAGNOSIS — D751 Secondary polycythemia: Secondary | ICD-10-CM | POA: Diagnosis not present

## 2022-06-12 DIAGNOSIS — E785 Hyperlipidemia, unspecified: Secondary | ICD-10-CM | POA: Diagnosis not present

## 2022-06-12 DIAGNOSIS — E1169 Type 2 diabetes mellitus with other specified complication: Secondary | ICD-10-CM | POA: Diagnosis not present

## 2022-06-12 DIAGNOSIS — R3915 Urgency of urination: Secondary | ICD-10-CM | POA: Diagnosis not present

## 2022-06-12 DIAGNOSIS — E291 Testicular hypofunction: Secondary | ICD-10-CM | POA: Diagnosis not present

## 2022-06-12 DIAGNOSIS — E1159 Type 2 diabetes mellitus with other circulatory complications: Secondary | ICD-10-CM | POA: Diagnosis not present

## 2022-06-12 DIAGNOSIS — I152 Hypertension secondary to endocrine disorders: Secondary | ICD-10-CM | POA: Diagnosis not present

## 2022-06-14 DIAGNOSIS — J301 Allergic rhinitis due to pollen: Secondary | ICD-10-CM | POA: Diagnosis not present

## 2022-06-14 DIAGNOSIS — R053 Chronic cough: Secondary | ICD-10-CM | POA: Diagnosis not present

## 2022-06-14 DIAGNOSIS — K219 Gastro-esophageal reflux disease without esophagitis: Secondary | ICD-10-CM | POA: Diagnosis not present

## 2022-06-19 DIAGNOSIS — R1012 Left upper quadrant pain: Secondary | ICD-10-CM | POA: Diagnosis not present

## 2022-07-09 DIAGNOSIS — L905 Scar conditions and fibrosis of skin: Secondary | ICD-10-CM | POA: Diagnosis not present

## 2022-07-09 DIAGNOSIS — L720 Epidermal cyst: Secondary | ICD-10-CM | POA: Diagnosis not present

## 2022-07-09 DIAGNOSIS — L929 Granulomatous disorder of the skin and subcutaneous tissue, unspecified: Secondary | ICD-10-CM | POA: Diagnosis not present

## 2022-07-23 DIAGNOSIS — R718 Other abnormality of red blood cells: Secondary | ICD-10-CM | POA: Diagnosis not present

## 2022-07-23 DIAGNOSIS — D751 Secondary polycythemia: Secondary | ICD-10-CM | POA: Diagnosis not present

## 2022-08-07 DIAGNOSIS — F419 Anxiety disorder, unspecified: Secondary | ICD-10-CM | POA: Diagnosis not present

## 2022-08-07 DIAGNOSIS — F5105 Insomnia due to other mental disorder: Secondary | ICD-10-CM | POA: Diagnosis not present

## 2022-08-07 DIAGNOSIS — F322 Major depressive disorder, single episode, severe without psychotic features: Secondary | ICD-10-CM | POA: Diagnosis not present

## 2022-08-27 DIAGNOSIS — R053 Chronic cough: Secondary | ICD-10-CM | POA: Diagnosis not present

## 2022-08-28 DIAGNOSIS — G43719 Chronic migraine without aura, intractable, without status migrainosus: Secondary | ICD-10-CM | POA: Diagnosis not present

## 2022-08-29 DIAGNOSIS — F5105 Insomnia due to other mental disorder: Secondary | ICD-10-CM | POA: Diagnosis not present

## 2022-08-29 DIAGNOSIS — F322 Major depressive disorder, single episode, severe without psychotic features: Secondary | ICD-10-CM | POA: Diagnosis not present

## 2022-08-29 DIAGNOSIS — F419 Anxiety disorder, unspecified: Secondary | ICD-10-CM | POA: Diagnosis not present

## 2022-09-03 DIAGNOSIS — M533 Sacrococcygeal disorders, not elsewhere classified: Secondary | ICD-10-CM | POA: Diagnosis not present

## 2022-09-03 DIAGNOSIS — M5412 Radiculopathy, cervical region: Secondary | ICD-10-CM | POA: Diagnosis not present

## 2022-09-03 DIAGNOSIS — K5900 Constipation, unspecified: Secondary | ICD-10-CM | POA: Diagnosis not present

## 2022-09-03 DIAGNOSIS — F419 Anxiety disorder, unspecified: Secondary | ICD-10-CM | POA: Diagnosis not present

## 2022-09-03 DIAGNOSIS — Z79899 Other long term (current) drug therapy: Secondary | ICD-10-CM | POA: Diagnosis not present

## 2022-09-03 DIAGNOSIS — M47814 Spondylosis without myelopathy or radiculopathy, thoracic region: Secondary | ICD-10-CM | POA: Diagnosis not present

## 2022-09-03 DIAGNOSIS — G894 Chronic pain syndrome: Secondary | ICD-10-CM | POA: Diagnosis not present

## 2022-09-03 DIAGNOSIS — M791 Myalgia, unspecified site: Secondary | ICD-10-CM | POA: Diagnosis not present

## 2022-09-04 DIAGNOSIS — D751 Secondary polycythemia: Secondary | ICD-10-CM | POA: Diagnosis not present

## 2022-09-04 DIAGNOSIS — R718 Other abnormality of red blood cells: Secondary | ICD-10-CM | POA: Diagnosis not present

## 2022-09-17 DIAGNOSIS — F419 Anxiety disorder, unspecified: Secondary | ICD-10-CM | POA: Diagnosis not present

## 2022-09-17 DIAGNOSIS — F5105 Insomnia due to other mental disorder: Secondary | ICD-10-CM | POA: Diagnosis not present

## 2022-09-17 DIAGNOSIS — F322 Major depressive disorder, single episode, severe without psychotic features: Secondary | ICD-10-CM | POA: Diagnosis not present

## 2022-09-21 ENCOUNTER — Ambulatory Visit
Admission: EM | Admit: 2022-09-21 | Discharge: 2022-09-21 | Disposition: A | Discharge status: Home / Self Care | Payer: Medicare Other | Attending: Family Medicine | Admitting: Family Medicine

## 2022-09-21 DIAGNOSIS — J111 Influenza due to unidentified influenza virus with other respiratory manifestations: Secondary | ICD-10-CM | POA: Diagnosis not present

## 2022-09-21 DIAGNOSIS — Z1152 Encounter for screening for COVID-19: Secondary | ICD-10-CM | POA: Diagnosis not present

## 2022-09-21 LAB — RESP PANEL BY RT-PCR (FLU A&B, COVID) ARPGX2
Influenza A by PCR: POSITIVE — AB
Influenza B by PCR: NEGATIVE
SARS Coronavirus 2 by RT PCR: NEGATIVE

## 2022-09-21 MED ORDER — ALBUTEROL SULFATE HFA 108 (90 BASE) MCG/ACT IN AERS: 2.0000 | INHALATION_SPRAY | RESPIRATORY_TRACT | 0 refills | Status: DC | PRN

## 2022-09-21 MED ORDER — PROMETHAZINE-DM 6.25-15 MG/5ML PO SYRP: 5.0000 mL | ORAL_SOLUTION | Freq: Four times a day (QID) | ORAL | 0 refills | Status: DC | PRN

## 2022-09-21 NOTE — Discharge Instructions (Addendum)
We will contact you if your COVID/influenza test is positive.  Please quarantine while you wait for the results.  If your test is negative you may resume normal activities.  If your test is positive please continue to quarantine for at least 5 days from your symptom onset or until you are without a fever for at least 24 hours after the medications.   You can take Tylenol and/or Ibuprofen as needed for fever reduction and pain relief.    For cough: honey 1/2 to 1 teaspoon (you can dilute the honey in water or another fluid).  You can also use guaifenesin and dextromethorphan for cough. You can use a humidifier for chest congestion and cough.  If you don't have a humidifier, you can sit in the bathroom with the hot shower running.      For sore throat: try warm salt water gargles, cepacol lozenges, throat spray, warm tea or water with lemon/honey, popsicles or ice, or OTC cold relief medicine for throat discomfort.    For congestion: take a daily anti-histamine like Zyrtec, Claritin, and a oral decongestant, such as pseudoephedrine.  You can also use Afrin 1-2 sprays in each nostril every 10-12 hours.    It is important to stay hydrated: drink plenty of fluids (water, gatorade/powerade/pedialyte, juices, or teas) to keep your throat moisturized and help further relieve irritation/discomfort.    Return or go to the Emergency Department if symptoms worsen or do not improve in the next few days

## 2022-09-21 NOTE — ED Triage Notes (Signed)
Chief Complaint: fever (104), productive cough, and body aches.   Onset: 4-5 days.   OTC medications tried: Yes- cold and fever medication     with no relief  Sick exposure: Yes- wife and son had a stomach bug. Other family members sick as well. No COVID positives    New foods or medications: No  Recent Travel: No

## 2022-09-21 NOTE — ED Provider Notes (Addendum)
[facility]    CSN: [id number] Arrival date & time: 09/21/22  1435      History   Chief Complaint Chief Complaint  Patient presents with   Cough   Fever    HPI Alvey Brockel [facility]. is a 51 y.o. male.   HPI   Burch presents for fever and cough. Thought he had a cold but symptoms kept progressing. Productive cough for couple days then high fevers.  Has chest tightness but no shortness of breath. Taking multi-symptoms cold and flu which helped somewhat.  Guifenasin and ibuprofen helped.  Tmax 104.3 F via thermomether. COVID test was negative a couple days.   No vomiting and diarrhea. His wife and son had vomiting. He got nauseous today. Had rhinorrhea, and body aches. No history of asthma.       Past Medical History:  Diagnosis Date   CAD (coronary artery disease)    Chronic pain    Diabetes mellitus without complication (HCC)    DJD (degenerative joint disease)    GERD (gastroesophageal reflux disease)    Headache    Hyperlipidemia    Hypertension    Hypogonadism male    Kidney stones     Patient Active Problem List   Diagnosis Date Noted   Kidney stone 11/10/2021   Pernicious anemia 01/12/2021   Iron deficiency anemia 01/03/2021   B12 deficiency 01/03/2021   Nontraumatic incomplete tear of left rotator cuff 12/30/2019   Nontraumatic type 1 superior labrum extending from anterior to posterior (SLAP) lesion of left shoulder 12/30/2019   Tendinitis of upper biceps tendon of left shoulder 12/30/2019   Rotator cuff tendinitis, left 12/23/2019   Headache disorder 07/06/2019   Intractable migraine without aura and without status migrainosus 01/08/2019   Chronic pain 01/06/2019   DJD (degenerative joint disease), lumbosacral 01/06/2019   GERD (gastroesophageal reflux disease) 01/06/2019   History of kidney stones 01/06/2019   Hyperlipidemia due to type 2 diabetes mellitus ([place]) 01/06/2019   Hypertension associated with diabetes ([place]) 01/06/2019   S/P  partial thyroidectomy 01/06/2019   Acute nonintractable headache 11/27/2018   Neuropathy, arm, right 11/25/2018   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   Hypogonadism, male 01/29/2018   Chronic tension-type headache, not intractable 11/28/2017   Numbness 11/28/2017   Primary osteoarthritis of right knee 06/14/2016   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   Strain of right knee 01/04/2016   Quadriceps tendinitis 08/03/2015   Insomnia 05/07/2014   Depression 03/11/2014   Herpes labialis 03/11/2014   Low testosterone 09/27/2011   Type 2 diabetes mellitus without complication, without long-term current use of insulin ([place]) 09/27/2011    Past Surgical History:  Procedure Laterality Date   BICEPT TENODESIS Left 12/30/2019   Procedure: MINI BICEPS TENODESIS;  Surgeon: Corky Mull, MD;  Location: [facility] ORS;  Service: Orthopedics;  Laterality: Left;   COLONOSCOPY WITH PROPOFOL N/A 01/05/2019   Procedure: COLONOSCOPY WITH PROPOFOL;  Surgeon: Lollie Sails, MD;  Location: [facility] ENDOSCOPY;  Service: Endoscopy;  Laterality: N/A;   ESOPHAGOGASTRODUODENOSCOPY (EGD) WITH PROPOFOL N/A 01/05/2019   Procedure: ESOPHAGOGASTRODUODENOSCOPY (EGD) WITH PROPOFOL;  Surgeon: Lollie Sails, MD;  Location: [facility] ENDOSCOPY;  Service: Endoscopy;  Laterality: N/A;   EXTRACORPOREAL SHOCK WAVE LITHOTRIPSY Right 05/14/2019   Procedure: EXTRACORPOREAL SHOCK WAVE LITHOTRIPSY (ESWL);  Surgeon: [name], MD;  Location: [facility] ORS;  Service: Urology;  Laterality: Right;   GIVENS CAPSULE STUDY N/A 04/10/2021   Procedure: GIVENS CAPSULE STUDY;  Surgeon: Lucilla Lame, MD;  Location: [facility] ENDOSCOPY;  Service: Endoscopy;  Laterality: N/A;   KNEE ARTHROSCOPY     KNEE ARTHROSCOPY WITH MENISCAL REPAIR Right 01/19/2016   Procedure: KNEE ARTHROSCOPY WITH MENISCAL REPAIR;  Surgeon: Corky Mull, MD;  Location: [facility] ORS;  Service: Orthopedics;  Laterality: Right;   KNEE ARTHROSCOPY WITH MENISCAL REPAIR Right 06/12/2016   Procedure:  KNEE ARTHROSCOPY WITH PARTIAL MEDIAL MENISCAL REPAIR;  Surgeon: Corky Mull, MD;  Location: [facility] ORS;  Service: Orthopedics;  Laterality: Right;   PARATHYROIDECTOMY     SHOULDER ARTHROSCOPY WITH ROTATOR CUFF REPAIR Left 12/30/2019   Procedure: SHOULDER ARTHROSCOPY WITH ROTATOR CUFF REPAIR;  Surgeon: Corky Mull, MD;  Location: [facility] ORS;  Service: Orthopedics;  Laterality: Left;   SHOULDER SURGERY Bilateral    SPINAL CORD STIMULATOR BATTERY EXCHANGE     SPINAL CORD STIMULATOR IMPLANT  2008   SPINAL FUSION     L4-L5 (2004), L5-S1 (2006)   THYROIDECTOMY, PARTIAL     TONSILLECTOMY         Home Medications    Prior to Admission medications   Medication Sig Start Date End Date Taking? Authorizing Provider  acyclovir (ZOVIRAX) 200 MG capsule Take 200 mg by mouth 2 (two) times daily as needed (fever blisters).    Yes [provider]  albuterol (VENTOLIN HFA) 108 (90 Base) MCG/ACT inhaler Inhale 2 puffs into the lungs every 4 (four) hours as needed. 09/21/22  Yes Emerson Schreifels, DO  atorvastatin (LIPITOR) 10 MG tablet Take 1 tablet by mouth daily. 12/29/21  Yes [provider]  cholecalciferol (VITAMIN D) 1000 units tablet Take 1,000 Units by mouth daily.   Yes [provider]  glipiZIDE (GLUCOTROL XL) 2.5 MG 24 hr tablet Take 2.5 mg by mouth daily. 09/26/21  Yes [provider]  metFORMIN (GLUCOPHAGE-XR) 500 MG 24 hr tablet Take 1,000 mg by mouth 2 (two) times daily. 11/20/19  Yes [provider]  metoprolol succinate (TOPROL-XL) 50 MG 24 hr tablet Take 1.5 tablets (75 mg total) by mouth 2 (two) times daily. Take with or immediately following a meal. 09/01/21  Yes Sherren Mocha, MD  pantoprazole (PROTONIX) 40 MG tablet Take 40 mg by mouth daily. 12/09/18  Yes [provider]  promethazine-dextromethorphan (PROMETHAZINE-DM) 6.25-15 MG/5ML syrup Take 5 mLs by mouth 4 (four) times daily as needed. 09/21/22  Yes Dezmin Kittelson, Ronnette Juniper, DO  testosterone  cypionate (DEPOTESTOSTERONE CYPIONATE) 200 MG/ML injection Inject 200 mg into the muscle every 7 (seven) days.   Yes [provider]  traZODone (DESYREL) 50 MG tablet Take 25 mg by mouth at bedtime as needed for sleep.    Yes [provider]  ACCU-CHEK GUIDE test strip  01/24/21   [provider]  Accu-Chek Softclix Lancets lancets SMARTSIG:Topical 01/24/21   [provider]  Atogepant (QULIPTA) 60 MG TABS Take by mouth. 05/08/21   [provider]  atorvastatin (LIPITOR) 40 MG tablet TAKE 1 TABLET BY MOUTH EVERY DAY Patient not taking: Reported on 01/02/2022 10/11/21   Sherren Mocha, MD  Blood Glucose Monitoring Suppl (ACCU-CHEK GUIDE) w/Device KIT  01/24/21   [provider]  Eluxadoline (VIBERZI) 75 MG TABS Take 1 tablet by mouth 2 (two) times daily. Patient not taking: Reported on 11/07/2021 07/12/21   Lucilla Lame, MD  EPINEPHrine 0.3 mg/0.3 mL IJ SOAJ injection Inject 0.3 mg into the muscle as needed for anaphylaxis. 12/11/19   [provider]  HYDROmorphone (DILAUDID) 4 MG tablet Take 4 mg by mouth daily as needed. Patient not taking: Reported on  11/07/2021 10/03/21   [provider]  hydrOXYzine (ATARAX/VISTARIL) 10 MG tablet Take 10 mg by mouth 3 (three) times daily as needed for itching. Patient not taking: Reported on 11/07/2021    [provider]  lisinopril (ZESTRIL) 2.5 MG tablet Take by mouth. 12/29/21 12/29/22  [provider]  nortriptyline (PAMELOR) 50 MG capsule Take 50 mg by mouth at bedtime.  Patient not taking: Reported on 11/07/2021 11/03/19   [provider]  OXYCONTIN 10 MG 12 hr tablet Take 10 mg by mouth in the morning and at bedtime. 11/09/19   [provider]  SUMAtriptan (IMITREX) 100 MG tablet Take 100 mg by mouth every 2 (two) hours as needed for migraine. Patient not taking: Reported on 11/07/2021 10/06/19   [provider]  SYRINGE-NEEDLE, DISP, 3 ML (MONOJECT 3CC  SYR 23GX1") 23G X 1" 3 ML MISC Use 1 Syringe as directed 01/17/21   [provider]  tamsulosin (FLOMAX) 0.4 MG CAPS capsule Take 1 capsule (0.4 mg total) by mouth daily. 03/30/19   [name], MD    Family History Family History  Problem Relation Age of Onset   Bladder Cancer Neg Hx    Kidney cancer Neg Hx    Prostate cancer Neg Hx     Social History Social History   Tobacco Use   Smoking status: Never   Smokeless tobacco: Never  Vaping Use   Vaping Use: Never used  Substance Use Topics   Alcohol use: No   Drug use: No     Allergies   Pravastatin and Celexa [citalopram]   Review of Systems Review of Systems: negative unless otherwise stated in HPI.      Physical Exam Triage Vital Signs ED Triage Vitals  Enc Vitals Group     BP 09/21/22 1556 106/70     Pulse Rate 09/21/22 1556 (!) 104     Resp 09/21/22 1556 16     Temp 09/21/22 1556 99.8 F (37.7 C)     Temp Source 09/21/22 1556 Oral     SpO2 09/21/22 1556 95 %     Weight 09/21/22 1558 165 lb (74.8 kg)     Height 09/21/22 1558 _0  (1.676 m)     Head Circumference --      Peak Flow --      Pain Score 09/21/22 1554 6     Pain Loc --      Pain Edu? --      Excl. in [place]? --    No data found.  Updated Vital Signs BP 106/70 (BP Location: Left Arm)   Pulse (!) 104   Temp 99.8 F (37.7 C) (Oral)   Resp 16   Ht _1  (1.676 m)   Wt 74.8 kg   SpO2 95%   BMI 26.63 kg/m   Visual Acuity Right Eye Distance:   Left Eye Distance:   Bilateral Distance:    Right Eye Near:   Left Eye Near:    Bilateral Near:     Physical Exam GEN:     alert, non-toxic appearing male in no distress    HENT:  mucus membranes moist, clear nasal discharge EYES:   pupils equal and reactive, no scleral injection or discharge NECK:  normal ROM, no meningismus   RESP:  no increased work of breathing, clear to auscultation bilaterally, frequent coughing during exam CVS:   Regular rhythm, tachycardic Skin:   warm  and dry, no rash on visible skin  UC Treatments / Results  Labs (all labs ordered are listed, but only abnormal results are displayed) Labs Reviewed  RESP PANEL BY RT-PCR (FLU A&B, COVID) ARPGX2 - Abnormal; Notable for the following components:      Result Value   Influenza A by PCR POSITIVE (*)    All other components within normal limits    EKG   Radiology No results found.  Procedures Procedures (including critical care time)  Medications Ordered in UC Medications - No data to display  Initial Impression / Assessment and Plan / UC Course  I have reviewed the triage vital signs and the nursing notes.  Pertinent labs & imaging results that were available during my care of the patient were reviewed by me and considered in my medical decision making (see chart for details).      Pt is a 51 y.o. male who presents for 5-6 days of cough with fever that is not improving.  Sheriff has an elevated temperature here, 99.55F.  Satting adequately (95%)on room air. He is tachycardic. Overall pt is  non-toxic appearing, well hydrated, without respiratory distress. Pulmonary exam is remarkable for rhonchi that clear with cough.  After shared decision making we will not pursue chest x-ray at this time as it currently would not change management.  COVID  and influenza testing obtained and pt was positive for influenza. He was called and updated on his influenza results.  Unfortunately he is outside the treatment window for Tamiflu.  Promethazine DM cough syrup given for cough and allow patient to rest. Albuterol for bronchospasm.  Motrin and Tylenol for fever.  Typical duration of symptoms discussed. Return and ED precautions given and patient voiced understanding.   Discussed MDM, treatment plan and plan for follow-up with patient who agrees with plan.       Final Clinical Impressions(s) / UC Diagnoses   Final diagnoses:  Influenza with respiratory manifestation     Discharge  Instructions      We will contact you if your COVID/influenza test is positive.  Please quarantine while you wait for the results.  If your test is negative you may resume normal activities.  If your test is positive please continue to quarantine for at least 5 days from your symptom onset or until you are without a fever for at least 24 hours after the medications.   You can take Tylenol and/or Ibuprofen as needed for fever reduction and pain relief.    For cough: honey 1/2 to 1 teaspoon (you can dilute the honey in water or another fluid).  You can also use guaifenesin and dextromethorphan for cough. You can use a humidifier for chest congestion and cough.  If you don't have a humidifier, you can sit in the bathroom with the hot shower running.      For sore throat: try warm salt water gargles, cepacol lozenges, throat spray, warm tea or water with lemon/honey, popsicles or ice, or OTC cold relief medicine for throat discomfort.    For congestion: take a daily anti-histamine like Zyrtec, Claritin, and a oral decongestant, such as pseudoephedrine.  You can also use Afrin 1-2 sprays in each nostril every 10-12 hours.    It is important to stay hydrated: drink plenty of fluids (water, gatorade/powerade/pedialyte, juices, or teas) to keep your throat moisturized and help further relieve irritation/discomfort.    Return or go to the Emergency Department if symptoms worsen or do not improve in the next few days  ED Prescriptions     Medication Sig Dispense Auth. Provider   albuterol (VENTOLIN HFA) 108 (90 Base) MCG/ACT inhaler Inhale 2 puffs into the lungs every 4 (four) hours as needed. 6.7 g Sejla Marzano, DO   promethazine-dextromethorphan (PROMETHAZINE-DM) 6.25-15 MG/5ML syrup Take 5 mLs by mouth 4 (four) times daily as needed. 118 mL Lyndee Hensen, DO      PDMP not reviewed this encounter.       Lyndee Hensen, DO 09/21/22 1855

## 2022-09-23 ENCOUNTER — Ambulatory Visit: Admit: 2022-09-23 | Payer: Medicare Other | Source: Home / Self Care

## 2022-09-24 ENCOUNTER — Ambulatory Visit
Admission: EM | Admit: 2022-09-24 | Discharge: 2022-09-24 | Disposition: A | Discharge status: Home / Self Care | Payer: Medicare Other | Attending: Physician Assistant | Admitting: Physician Assistant

## 2022-09-24 VITALS — BP 142/86 | HR 98 | Temp 98.7°F | Resp 20

## 2022-09-24 DIAGNOSIS — R059 Cough, unspecified: Secondary | ICD-10-CM | POA: Diagnosis not present

## 2022-09-24 DIAGNOSIS — J09X2 Influenza due to identified novel influenza A virus with other respiratory manifestations: Secondary | ICD-10-CM

## 2022-09-24 MED ORDER — HYDROCOD POLI-CHLORPHE POLI ER 10-8 MG/5ML PO SUER: 5.0000 mL | Freq: Two times a day (BID) | ORAL | 0 refills | Status: AC

## 2022-09-24 MED ORDER — BENZONATATE 100 MG PO CAPS: 100.0000 mg | ORAL_CAPSULE | Freq: Three times a day (TID) | ORAL | 0 refills | Status: DC

## 2022-09-24 NOTE — Discharge Instructions (Addendum)
-  Again, flu symptoms can be ongoing, unfortunately for days to weeks -Will continue using albuterol inhaler, 2 puffs every 6-8 hours as needed for continued cough or cough fits.  Also for shortness of breath and wheezing -Your prescription for Tussionex: Can take twice a day for the cough.  Would use caution when using this as it can make you drowsy -Would stop and Promethazine DM as it can make you more sleepy while using the Tussionex as well -Tessalon Perles every 8 hours as needed for cough

## 2022-09-24 NOTE — ED Triage Notes (Signed)
Pt reports he has cough, chills, body aches, headache. Was seen on Friday. Was told to come back if symptoms didn't improve. Took promethazine cough meds that were prescribed here.

## 2022-09-24 NOTE — ED Provider Notes (Signed)
[facility]    CSN: [id number] Arrival date & time: 09/24/22  1159      History   Chief Complaint No chief complaint on file.   HPI [name] Brown. is a 51 y.o. male.   Patient is a 51 year old male who presents with continued reports of cough, chills, body aches, and headache.  Patient was seen here on December 1 and diagnosed with influenza A.  He was given prescription for albuterol and Promethazine DM cough syrup.  Patient was outside the window for antiviral therapy.  Patient presents for continued symptoms.  Patient states he has been taking the Promethazine DM without much improvement in his cough.    Past Medical History:  Diagnosis Date   CAD (coronary artery disease)    Chronic pain    Diabetes mellitus without complication (HCC)    DJD (degenerative joint disease)    GERD (gastroesophageal reflux disease)    Headache    Hyperlipidemia    Hypertension    Hypogonadism male    Kidney stones     Patient Active Problem List   Diagnosis Date Noted   Kidney stone 11/10/2021   Pernicious anemia 01/12/2021   Iron deficiency anemia 01/03/2021   B12 deficiency 01/03/2021   Nontraumatic incomplete tear of left rotator cuff 12/30/2019   Nontraumatic type 1 superior labrum extending from anterior to posterior (SLAP) lesion of left shoulder 12/30/2019   Tendinitis of upper biceps tendon of left shoulder 12/30/2019   Rotator cuff tendinitis, left 12/23/2019   Headache disorder 07/06/2019   Intractable migraine without aura and without status migrainosus 01/08/2019   Chronic pain 01/06/2019   DJD (degenerative joint disease), lumbosacral 01/06/2019   GERD (gastroesophageal reflux disease) 01/06/2019   History of kidney stones 01/06/2019   Hyperlipidemia due to type 2 diabetes mellitus ([place]) 01/06/2019   Hypertension associated with diabetes ([place]) 01/06/2019   S/P partial thyroidectomy 01/06/2019   Acute nonintractable headache 11/27/2018   Neuropathy,  arm, right 11/25/2018   Hyperprolactinemia ([place]) 01/29/2018   Hypogonadism, male 01/29/2018   Chronic tension-type headache, not intractable 11/28/2017   Numbness 11/28/2017   Primary osteoarthritis of right knee 06/14/2016   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   Strain of right knee 01/04/2016   Quadriceps tendinitis 08/03/2015   Insomnia 05/07/2014   Depression 03/11/2014   Herpes labialis 03/11/2014   Low testosterone 09/27/2011   Type 2 diabetes mellitus without complication, without long-term current use of insulin ([place]) 09/27/2011    Past Surgical History:  Procedure Laterality Date   BICEPT TENODESIS Left 12/30/2019   Procedure: MINI BICEPS TENODESIS;  Surgeon: Corky Mull, MD;  Location: [facility] ORS;  Service: Orthopedics;  Laterality: Left;   COLONOSCOPY WITH PROPOFOL N/A 01/05/2019   Procedure: COLONOSCOPY WITH PROPOFOL;  Surgeon: Lollie Sails, MD;  Location: [facility] ENDOSCOPY;  Service: Endoscopy;  Laterality: N/A;   ESOPHAGOGASTRODUODENOSCOPY (EGD) WITH PROPOFOL N/A 01/05/2019   Procedure: ESOPHAGOGASTRODUODENOSCOPY (EGD) WITH PROPOFOL;  Surgeon: Lollie Sails, MD;  Location: [facility] ENDOSCOPY;  Service: Endoscopy;  Laterality: N/A;   EXTRACORPOREAL SHOCK WAVE LITHOTRIPSY Right 05/14/2019   Procedure: EXTRACORPOREAL SHOCK WAVE LITHOTRIPSY (ESWL);  Surgeon: [name], MD;  Location: [facility] ORS;  Service: Urology;  Laterality: Right;   GIVENS CAPSULE STUDY N/A 04/10/2021   Procedure: GIVENS CAPSULE STUDY;  Surgeon: Lucilla Lame, MD;  Location: [facility] ENDOSCOPY;  Service: Endoscopy;  Laterality: N/A;   KNEE ARTHROSCOPY     KNEE ARTHROSCOPY WITH MENISCAL REPAIR Right 01/19/2016   Procedure: KNEE  ARTHROSCOPY WITH MENISCAL REPAIR;  Surgeon: Corky Mull, MD;  Location: [facility] ORS;  Service: Orthopedics;  Laterality: Right;   KNEE ARTHROSCOPY WITH MENISCAL REPAIR Right 06/12/2016   Procedure: KNEE ARTHROSCOPY WITH PARTIAL MEDIAL MENISCAL REPAIR;  Surgeon: Corky Mull, MD;   Location: [facility] ORS;  Service: Orthopedics;  Laterality: Right;   PARATHYROIDECTOMY     SHOULDER ARTHROSCOPY WITH ROTATOR CUFF REPAIR Left 12/30/2019   Procedure: SHOULDER ARTHROSCOPY WITH ROTATOR CUFF REPAIR;  Surgeon: Corky Mull, MD;  Location: [facility] ORS;  Service: Orthopedics;  Laterality: Left;   SHOULDER SURGERY Bilateral    SPINAL CORD STIMULATOR BATTERY EXCHANGE     SPINAL CORD STIMULATOR IMPLANT  2008   SPINAL FUSION     L4-L5 (2004), L5-S1 (2006)   THYROIDECTOMY, PARTIAL     TONSILLECTOMY         Home Medications    Prior to Admission medications   Medication Sig Start Date End Date Taking? Authorizing Provider  benzonatate (TESSALON) 100 MG capsule Take 1 capsule (100 mg total) by mouth every 8 (eight) hours. 09/24/22  Yes Luvenia Redden, PA-C  chlorpheniramine-HYDROcodone (TUSSIONEX) 10-8 MG/5ML Take 5 mLs by mouth 2 (two) times daily for 10 days. 09/24/22 10/04/22 Yes Luvenia Redden, PA-C  ACCU-CHEK GUIDE test strip  01/24/21   [provider]  Accu-Chek Softclix Lancets lancets SMARTSIG:Topical 01/24/21   [provider]  acyclovir (ZOVIRAX) 200 MG capsule Take 200 mg by mouth 2 (two) times daily as needed (fever blisters).     [provider]  albuterol (VENTOLIN HFA) 108 (90 Base) MCG/ACT inhaler Inhale 2 puffs into the lungs every 4 (four) hours as needed. 09/21/22   Brimage, Ronnette Juniper, DO  Atogepant (QULIPTA) 60 MG TABS Take by mouth. 05/08/21   [provider]  atorvastatin (LIPITOR) 10 MG tablet Take 1 tablet by mouth daily. 12/29/21   [provider]  atorvastatin (LIPITOR) 40 MG tablet TAKE 1 TABLET BY MOUTH EVERY DAY Patient not taking: Reported on 01/02/2022 10/11/21   Sherren Mocha, MD  Blood Glucose Monitoring Suppl (ACCU-CHEK GUIDE) w/Device KIT  01/24/21   [provider]  cholecalciferol (VITAMIN D) 1000 units tablet Take 1,000 Units by mouth daily.    [provider]  Eluxadoline (VIBERZI) 75 MG TABS  Take 1 tablet by mouth 2 (two) times daily. Patient not taking: Reported on 11/07/2021 07/12/21   Lucilla Lame, MD  EPINEPHrine 0.3 mg/0.3 mL IJ SOAJ injection Inject 0.3 mg into the muscle as needed for anaphylaxis. 12/11/19   [provider]  glipiZIDE (GLUCOTROL XL) 2.5 MG 24 hr tablet Take 2.5 mg by mouth daily. 09/26/21   [provider]  HYDROmorphone (DILAUDID) 4 MG tablet Take 4 mg by mouth daily as needed. Patient not taking: Reported on 11/07/2021 10/03/21   [provider]  hydrOXYzine (ATARAX/VISTARIL) 10 MG tablet Take 10 mg by mouth 3 (three) times daily as needed for itching. Patient not taking: Reported on 11/07/2021    [provider]  lisinopril (ZESTRIL) 2.5 MG tablet Take by mouth. 12/29/21 12/29/22  [provider]  metFORMIN (GLUCOPHAGE-XR) 500 MG 24 hr tablet Take 1,000 mg by mouth 2 (two) times daily. 11/20/19   [provider]  metoprolol succinate (TOPROL-XL) 50 MG 24 hr tablet Take 1.5 tablets (75 mg total) by mouth 2 (two) times daily. Take with or immediately following a meal. 09/01/21   Sherren Mocha, MD  nortriptyline (PAMELOR) 50 MG capsule Take 50 mg by mouth  at bedtime.  Patient not taking: Reported on 11/07/2021 11/03/19   [provider]  OXYCONTIN 10 MG 12 hr tablet Take 10 mg by mouth in the morning and at bedtime. 11/09/19   [provider]  pantoprazole (PROTONIX) 40 MG tablet Take 40 mg by mouth daily. 12/09/18   [provider]  promethazine-dextromethorphan (PROMETHAZINE-DM) 6.25-15 MG/5ML syrup Take 5 mLs by mouth 4 (four) times daily as needed. 09/21/22   Brimage, Ronnette Juniper, DO  SUMAtriptan (IMITREX) 100 MG tablet Take 100 mg by mouth every 2 (two) hours as needed for migraine. Patient not taking: Reported on 11/07/2021 10/06/19   [provider]  SYRINGE-NEEDLE, DISP, 3 ML (MONOJECT 3CC SYR 23GX1") 23G X 1" 3 ML MISC Use 1 Syringe as directed 01/17/21   [provider]   tamsulosin (FLOMAX) 0.4 MG CAPS capsule Take 1 capsule (0.4 mg total) by mouth daily. 03/30/19   [name], MD  testosterone cypionate (DEPOTESTOSTERONE CYPIONATE) 200 MG/ML injection Inject 200 mg into the muscle every 7 (seven) days.    [provider]  traZODone (DESYREL) 50 MG tablet Take 25 mg by mouth at bedtime as needed for sleep.     [provider]    Family History Family History  Problem Relation Age of Onset   Bladder Cancer Neg Hx    Kidney cancer Neg Hx    Prostate cancer Neg Hx     Social History Social History   Tobacco Use   Smoking status: Never   Smokeless tobacco: Never  Vaping Use   Vaping Use: Never used  Substance Use Topics   Alcohol use: No   Drug use: No     Allergies   Pravastatin and Celexa [citalopram]   Review of Systems Review of Systems as noted above.  Other systems reviewed and found to be negative   Physical Exam Triage Vital Signs ED Triage Vitals  Enc Vitals Group     BP 09/24/22 1208 (!) 142/86     Pulse Rate 09/24/22 1208 98     Resp 09/24/22 1208 20     Temp 09/24/22 1208 98.7 F (37.1 C)     Temp Source 09/24/22 1208 Oral     SpO2 09/24/22 1208 98 %     Weight --      Height --      Head Circumference --      Peak Flow --      Pain Score 09/24/22 1211 5     Pain Loc --      Pain Edu? --      Excl. in [place]? --    No data found.  Updated Vital Signs BP (!) 142/86 (BP Location: Left Arm)   Pulse 98   Temp 98.7 F (37.1 C) (Oral)   Resp 20   SpO2 98%   Visual Acuity Right Eye Distance:   Left Eye Distance:   Bilateral Distance:    Right Eye Near:   Left Eye Near:    Bilateral Near:     Physical Exam Constitutional:      Appearance: Normal appearance. He is not toxic-appearing.  Cardiovascular:     Rate and Rhythm: Normal rate and regular rhythm.  Pulmonary:     Effort: Pulmonary effort is normal.     Comments: Frequent coughing.  Mild expiratory wheezing in the bilateral  lobes Neurological:     General: No focal deficit present.     Mental Status: He is alert and oriented  to person, place, and time.      UC Treatments / Results  Labs (all labs ordered are listed, but only abnormal results are displayed) Labs Reviewed - No data to display  EKG   Radiology No results found.  Procedures Procedures (including critical care time)  Medications Ordered in UC Medications - No data to display  Initial Impression / Assessment and Plan / UC Course  I have reviewed the triage vital signs and the nursing notes.  Pertinent labs & imaging results that were available during my care of the patient were reviewed by me and considered in my medical decision making (see chart for details).    Patient with continued cough.  Patient seen here on Friday with diagnosis of influenza A.  Patient given prescription for Promethazine DM.  Patient has the albuterol inhaler.  Will give him prescription for Tessalon as well as Tussionex to see if that is better than the promethazine. Final Clinical Impressions(s) / UC Diagnoses   Final diagnoses:  Influenza due to identified novel influenza A virus with other respiratory manifestations  Cough, unspecified type     Discharge Instructions      -Again, flu symptoms can be ongoing, unfortunately for days to weeks -Will continue using albuterol inhaler, 2 puffs every 6-8 hours as needed for continued cough or cough fits.  Also for shortness of breath and wheezing -Your prescription for Tussionex: Can take twice a day for the cough.  Would use caution when using this as it can make you drowsy -Would stop and Promethazine DM as it can make you more sleepy while using the Tussionex as well -Tessalon Per[place] every 8 hours as needed for cough     ED Prescriptions     Medication Sig Dispense Auth. Provider   chlorpheniramine-HYDROcodone (TUSSIONEX) 10-8 MG/5ML Take 5 mLs by mouth 2 (two) times daily for 10 days. 100 mL  Luvenia Redden, PA-C   benzonatate (TESSALON) 100 MG capsule Take 1 capsule (100 mg total) by mouth every 8 (eight) hours. 21 capsule Luvenia Redden, PA-C      I have reviewed the PDMP during this encounter.   Luvenia Redden, PA-C 09/24/22 1410

## 2022-09-27 DIAGNOSIS — E892 Postprocedural hypoparathyroidism: Secondary | ICD-10-CM | POA: Diagnosis not present

## 2022-09-27 DIAGNOSIS — J40 Bronchitis, not specified as acute or chronic: Secondary | ICD-10-CM | POA: Diagnosis not present

## 2022-10-04 DIAGNOSIS — D751 Secondary polycythemia: Secondary | ICD-10-CM | POA: Diagnosis not present

## 2022-10-04 DIAGNOSIS — D510 Vitamin B12 deficiency anemia due to intrinsic factor deficiency: Secondary | ICD-10-CM | POA: Diagnosis not present

## 2022-10-04 DIAGNOSIS — D500 Iron deficiency anemia secondary to blood loss (chronic): Secondary | ICD-10-CM | POA: Diagnosis not present

## 2022-10-09 DIAGNOSIS — Z1212 Encounter for screening for malignant neoplasm of rectum: Secondary | ICD-10-CM | POA: Diagnosis not present

## 2022-10-09 DIAGNOSIS — Z1211 Encounter for screening for malignant neoplasm of colon: Secondary | ICD-10-CM | POA: Diagnosis not present

## 2022-10-16 DIAGNOSIS — D751 Secondary polycythemia: Secondary | ICD-10-CM | POA: Diagnosis not present

## 2022-10-16 DIAGNOSIS — R718 Other abnormality of red blood cells: Secondary | ICD-10-CM | POA: Diagnosis not present

## 2022-10-17 DIAGNOSIS — F5105 Insomnia due to other mental disorder: Secondary | ICD-10-CM | POA: Diagnosis not present

## 2022-10-17 DIAGNOSIS — F322 Major depressive disorder, single episode, severe without psychotic features: Secondary | ICD-10-CM | POA: Diagnosis not present

## 2022-10-17 DIAGNOSIS — F419 Anxiety disorder, unspecified: Secondary | ICD-10-CM | POA: Diagnosis not present

## 2022-10-18 DIAGNOSIS — J301 Allergic rhinitis due to pollen: Secondary | ICD-10-CM | POA: Diagnosis not present

## 2022-10-24 DIAGNOSIS — I609 Nontraumatic subarachnoid hemorrhage, unspecified: Secondary | ICD-10-CM | POA: Diagnosis not present

## 2022-11-02 DIAGNOSIS — J301 Allergic rhinitis due to pollen: Secondary | ICD-10-CM | POA: Diagnosis not present

## 2022-11-05 DIAGNOSIS — E1159 Type 2 diabetes mellitus with other circulatory complications: Secondary | ICD-10-CM | POA: Diagnosis not present

## 2022-11-05 DIAGNOSIS — E785 Hyperlipidemia, unspecified: Secondary | ICD-10-CM | POA: Diagnosis not present

## 2022-11-05 DIAGNOSIS — I152 Hypertension secondary to endocrine disorders: Secondary | ICD-10-CM | POA: Diagnosis not present

## 2022-11-05 DIAGNOSIS — E221 Hyperprolactinemia: Secondary | ICD-10-CM | POA: Diagnosis not present

## 2022-11-05 DIAGNOSIS — E291 Testicular hypofunction: Secondary | ICD-10-CM | POA: Diagnosis not present

## 2022-11-05 DIAGNOSIS — E1169 Type 2 diabetes mellitus with other specified complication: Secondary | ICD-10-CM | POA: Diagnosis not present

## 2022-11-06 DIAGNOSIS — E538 Deficiency of other specified B group vitamins: Secondary | ICD-10-CM | POA: Diagnosis not present

## 2022-11-06 DIAGNOSIS — G4701 Insomnia due to medical condition: Secondary | ICD-10-CM | POA: Diagnosis not present

## 2022-11-06 DIAGNOSIS — E1169 Type 2 diabetes mellitus with other specified complication: Secondary | ICD-10-CM | POA: Diagnosis not present

## 2022-11-06 DIAGNOSIS — E221 Hyperprolactinemia: Secondary | ICD-10-CM | POA: Diagnosis not present

## 2022-11-06 DIAGNOSIS — E785 Hyperlipidemia, unspecified: Secondary | ICD-10-CM | POA: Diagnosis not present

## 2022-11-06 DIAGNOSIS — M2550 Pain in unspecified joint: Secondary | ICD-10-CM | POA: Diagnosis not present

## 2022-11-06 DIAGNOSIS — G8921 Chronic pain due to trauma: Secondary | ICD-10-CM | POA: Diagnosis not present

## 2022-11-06 DIAGNOSIS — R053 Chronic cough: Secondary | ICD-10-CM | POA: Diagnosis not present

## 2022-11-06 DIAGNOSIS — E291 Testicular hypofunction: Secondary | ICD-10-CM | POA: Diagnosis not present

## 2022-11-06 DIAGNOSIS — K219 Gastro-esophageal reflux disease without esophagitis: Secondary | ICD-10-CM | POA: Diagnosis not present

## 2022-11-06 DIAGNOSIS — R4586 Emotional lability: Secondary | ICD-10-CM | POA: Diagnosis not present

## 2022-11-06 DIAGNOSIS — Z0000 Encounter for general adult medical examination without abnormal findings: Secondary | ICD-10-CM | POA: Diagnosis not present

## 2022-12-04 DIAGNOSIS — G43719 Chronic migraine without aura, intractable, without status migrainosus: Secondary | ICD-10-CM | POA: Diagnosis not present

## 2022-12-06 ENCOUNTER — Other Ambulatory Visit: Payer: Self-pay | Admitting: Cardiovascular Disease

## 2022-12-06 DIAGNOSIS — M791 Myalgia, unspecified site: Secondary | ICD-10-CM | POA: Diagnosis not present

## 2022-12-06 DIAGNOSIS — M533 Sacrococcygeal disorders, not elsewhere classified: Secondary | ICD-10-CM | POA: Diagnosis not present

## 2022-12-06 DIAGNOSIS — M47814 Spondylosis without myelopathy or radiculopathy, thoracic region: Secondary | ICD-10-CM | POA: Diagnosis not present

## 2022-12-06 DIAGNOSIS — Z79899 Other long term (current) drug therapy: Secondary | ICD-10-CM | POA: Diagnosis not present

## 2022-12-06 DIAGNOSIS — F419 Anxiety disorder, unspecified: Secondary | ICD-10-CM | POA: Diagnosis not present

## 2022-12-06 DIAGNOSIS — K5900 Constipation, unspecified: Secondary | ICD-10-CM | POA: Diagnosis not present

## 2022-12-06 DIAGNOSIS — M5412 Radiculopathy, cervical region: Secondary | ICD-10-CM | POA: Diagnosis not present

## 2022-12-06 DIAGNOSIS — G894 Chronic pain syndrome: Secondary | ICD-10-CM | POA: Diagnosis not present

## 2022-12-20 ENCOUNTER — Telehealth: Payer: Medicare Other | Admitting: Family Medicine

## 2022-12-20 DIAGNOSIS — R6889 Other general symptoms and signs: Secondary | ICD-10-CM | POA: Diagnosis not present

## 2022-12-20 MED ORDER — OSELTAMIVIR PHOSPHATE 75 MG PO CAPS: 75.0000 mg | ORAL_CAPSULE | Freq: Two times a day (BID) | ORAL | 0 refills | Status: AC

## 2022-12-20 NOTE — Progress Notes (Signed)
E visit for Flu like symptoms   We are sorry that you are not feeling well.  Here is how we plan to help! Based on what you have shared with me it looks like you may have a respiratory virus that may be influenza.  Influenza or "the flu" is   an infection caused by a respiratory virus. The flu virus is highly contagious and persons who did not receive their yearly flu vaccination may "catch" the flu from close contact.  We have anti-viral medications to treat the viruses that cause this infection. They are not a "cure" and only shorten the course of the infection. These prescriptions are most effective when they are given within the first 2 days of "flu" symptoms. Antiviral medication are indicated if you have a high risk of complications from the flu. You should  also consider an antiviral medication if you are in close contact with someone who is at risk. These medications can help patients avoid complications from the flu  but have side effects that you should know. Possible side effects from Tamiflu or oseltamivir include nausea, vomiting, diarrhea, dizziness, headaches, eye redness, sleep problems or other respiratory symptoms. You should not take Tamiflu if you have an allergy to oseltamivir or any to the ingredients in Tamiflu.  Based upon your symptoms and potential risk factors I have prescribed Oseltamivir (Tamiflu).  It has been sent to your designated pharmacy.  You will take one 75 mg capsule orally twice a day for the next 5 days. and I recommend that you follow the flu symptoms recommendation that I have listed below.  ANYONE WHO HAS FLU SYMPTOMS SHOULD: Stay home. The flu is highly contagious and going out or to work exposes others! Be sure to drink plenty of fluids. Water is fine as well as fruit juices, sodas and electrolyte beverages. You may want to stay away from caffeine or alcohol. If you are nauseated, try taking small sips of liquids. How do you know if you are getting enough  fluid? Your urine should be a pale yellow or almost colorless. Get rest. Taking a steamy shower or using a humidifier may help nasal congestion and ease sore throat pain. Using a saline nasal spray works much the same way. Cough drops, hard candies and sore throat lozenges may ease your cough. Line up a caregiver. Have someone check on you regularly.   GET HELP RIGHT AWAY IF: You cannot keep down liquids or your medications. You become short of breath Your fell like you are going to pass out or loose consciousness. Your symptoms persist after you have completed your treatment plan MAKE SURE YOU  Understand these instructions. Will watch your condition. Will get help right away if you are not doing well or get worse.  Your e-visit answers were reviewed by a board certified advanced clinical practitioner to complete your personal care plan.  Depending on the condition, your plan could have included both over the counter or prescription medications.  If there is a problem please reply  once you have received a response from your provider.  Your safety is important to [place].  If you have drug allergies check your prescription carefully.    You can use MyChart to ask questions about today's visit, request a non-urgent call back, or ask for a work or school excuse for 24 hours related to this e-Visit. If it has been greater than 24 hours you will need to follow up with your provider, or enter a  new e-Visit to address those concerns.  You will get an e-mail in the next two days asking about your experience.  I hope that your e-visit has been valuable and will speed your recovery. Thank you for using e-visits.  I provided 5 minutes of non face-to-face time during this encounter for chart review, medication and order placement, as well as and documentation.

## 2022-12-26 DIAGNOSIS — F419 Anxiety disorder, unspecified: Secondary | ICD-10-CM | POA: Diagnosis not present

## 2022-12-26 DIAGNOSIS — F322 Major depressive disorder, single episode, severe without psychotic features: Secondary | ICD-10-CM | POA: Diagnosis not present

## 2022-12-26 DIAGNOSIS — F5105 Insomnia due to other mental disorder: Secondary | ICD-10-CM | POA: Diagnosis not present

## 2022-12-29 ENCOUNTER — Other Ambulatory Visit: Payer: Self-pay | Admitting: Cardiovascular Disease

## 2023-01-08 ENCOUNTER — Ambulatory Visit: Payer: Medicare Other | Admitting: Cardiovascular Disease

## 2023-01-12 ENCOUNTER — Other Ambulatory Visit: Payer: Self-pay | Admitting: Cardiovascular Disease

## 2023-01-24 ENCOUNTER — Other Ambulatory Visit: Payer: Self-pay | Admitting: Cardiovascular Disease

## 2023-01-24 DIAGNOSIS — L728 Other follicular cysts of the skin and subcutaneous tissue: Secondary | ICD-10-CM | POA: Diagnosis not present

## 2023-01-24 DIAGNOSIS — L570 Actinic keratosis: Secondary | ICD-10-CM | POA: Diagnosis not present

## 2023-01-24 DIAGNOSIS — L578 Other skin changes due to chronic exposure to nonionizing radiation: Secondary | ICD-10-CM | POA: Diagnosis not present

## 2023-02-04 ENCOUNTER — Encounter: Payer: Self-pay | Admitting: Cardiovascular Disease

## 2023-02-04 ENCOUNTER — Ambulatory Visit: Payer: Medicare Other | Attending: Cardiovascular Disease | Admitting: Cardiovascular Disease

## 2023-02-04 VITALS — BP 130/90 | HR 77 | Ht 66.0 in | Wt 180.6 lb

## 2023-02-04 DIAGNOSIS — I700 Atherosclerosis of aorta: Secondary | ICD-10-CM | POA: Diagnosis not present

## 2023-02-04 DIAGNOSIS — I10 Essential (primary) hypertension: Secondary | ICD-10-CM

## 2023-02-04 DIAGNOSIS — E782 Mixed hyperlipidemia: Secondary | ICD-10-CM | POA: Diagnosis not present

## 2023-02-04 DIAGNOSIS — R000 Tachycardia, unspecified: Secondary | ICD-10-CM

## 2023-02-04 DIAGNOSIS — I2510 Atherosclerotic heart disease of native coronary artery without angina pectoris: Secondary | ICD-10-CM | POA: Diagnosis not present

## 2023-02-04 NOTE — Progress Notes (Signed)
Cardiology Office Note:    Date:  02/04/2023   ID:  [name] Hellinger [facility]., DOB Apr 14, 1971, MRN [id number]  PCP:  Marina Goodell, MD   [facility] Providers Cardiologist:  Tonny Bollman, MD     Referring MD: Marina Goodell, MD   Chief Complaint  Patient presents with   Hypertension    History of Present Illness:    [name] Brown. is a 52 y.o. male with a hx of nonobstructive coronary artery disease, sinus tachycardia, type 2 diabetes, hypertension, and mixed hyperlipidemia, presenting for follow-up evaluation. He has testosterone induced thrombocytosis that is followed closely at [facility].   The patient is here alone this afternoon.  He has had a somewhat eventful year.  He has had a lot of problems with iron deficiency and fatigue associated with this.  He is followed at [facility] now through their hematology clinic.  He also suffered a subarachnoid hemorrhage last year.  He is not on any antiplatelet or anticoagulant therapy.  He states that his blood pressure has been well-controlled.  He is taking metoprolol 25 mg twice daily and reduce the dose on his own due to fatigue.  He is feeling better on this dose.  His elevated heart rates that I had seen him in the past for have come down.  These were associated with his iron deficiency.  Past Medical History:  Diagnosis Date   CAD (coronary artery disease)    Chronic pain    Diabetes mellitus without complication    DJD (degenerative joint disease)    GERD (gastroesophageal reflux disease)    Headache    Hyperlipidemia    Hypertension    Hypogonadism male    Kidney stones     Past Surgical History:  Procedure Laterality Date   BICEPT TENODESIS Left 12/30/2019   Procedure: MINI BICEPS TENODESIS;  Surgeon: Christena Flake, MD;  Location: [facility] ORS;  Service: Orthopedics;  Laterality: Left;   COLONOSCOPY WITH PROPOFOL N/A 01/05/2019   Procedure: COLONOSCOPY WITH PROPOFOL;  Surgeon: Christena Deem, MD;  Location:  [facility] ENDOSCOPY;  Service: Endoscopy;  Laterality: N/A;   ESOPHAGOGASTRODUODENOSCOPY (EGD) WITH PROPOFOL N/A 01/05/2019   Procedure: ESOPHAGOGASTRODUODENOSCOPY (EGD) WITH PROPOFOL;  Surgeon: Christena Deem, MD;  Location: [facility] ENDOSCOPY;  Service: Endoscopy;  Laterality: N/A;   EXTRACORPOREAL SHOCK WAVE LITHOTRIPSY Right 05/14/2019   Procedure: EXTRACORPOREAL SHOCK WAVE LITHOTRIPSY (ESWL);  Surgeon: Sondra Come, MD;  Location: [facility] ORS;  Service: Urology;  Laterality: Right;   GIVENS CAPSULE STUDY N/A 04/10/2021   Procedure: GIVENS CAPSULE STUDY;  Surgeon: Midge Minium, MD;  Location: [facility] ENDOSCOPY;  Service: Endoscopy;  Laterality: N/A;   KNEE ARTHROSCOPY     KNEE ARTHROSCOPY WITH MENISCAL REPAIR Right 01/19/2016   Procedure: KNEE ARTHROSCOPY WITH MENISCAL REPAIR;  Surgeon: Christena Flake, MD;  Location: [facility] ORS;  Service: Orthopedics;  Laterality: Right;   KNEE ARTHROSCOPY WITH MENISCAL REPAIR Right 06/12/2016   Procedure: KNEE ARTHROSCOPY WITH PARTIAL MEDIAL MENISCAL REPAIR;  Surgeon: Christena Flake, MD;  Location: [facility] ORS;  Service: Orthopedics;  Laterality: Right;   PARATHYROIDECTOMY     SHOULDER ARTHROSCOPY WITH ROTATOR CUFF REPAIR Left 12/30/2019   Procedure: SHOULDER ARTHROSCOPY WITH ROTATOR CUFF REPAIR;  Surgeon: Christena Flake, MD;  Location: [facility] ORS;  Service: Orthopedics;  Laterality: Left;   SHOULDER SURGERY Bilateral    SPINAL CORD STIMULATOR BATTERY EXCHANGE     SPINAL CORD STIMULATOR IMPLANT  2008   SPINAL FUSION  L4-L5 (2004), L5-S1 (2006)   THYROIDECTOMY, PARTIAL     TONSILLECTOMY      Current Medications: Current Meds  Medication Sig   ACCU-CHEK GUIDE test strip    Accu-Chek Softclix Lancets lancets SMARTSIG:Topical   acyclovir (ZOVIRAX) 200 MG capsule Take 200 mg by mouth 2 (two) times daily as needed (fever blisters).    atorvastatin (LIPITOR) 10 MG tablet Take 1 tablet by mouth daily.   Blood Glucose Monitoring Suppl (ACCU-CHEK GUIDE) w/Device KIT     cholecalciferol (VITAMIN D) 1000 units tablet Take 1,000 Units by mouth daily.   EPINEPHrine 0.3 mg/0.3 mL IJ SOAJ injection Inject 0.3 mg into the muscle as needed for anaphylaxis.   glipiZIDE (GLUCOTROL XL) 2.5 MG 24 hr tablet Take 2.5 mg by mouth daily.   HYDROmorphone (DILAUDID) 4 MG tablet Take 4 mg by mouth daily as needed.   hydrOXYzine (ATARAX/VISTARIL) 10 MG tablet Take 10 mg by mouth 3 (three) times daily as needed for itching.   metFORMIN (GLUCOPHAGE-XR) 500 MG 24 hr tablet Take 1,000 mg by mouth 2 (two) times daily.   metoprolol succinate (TOPROL-XL) 50 MG 24 hr tablet TAKE 1.5 TABLETS BY MOUTH 2 TIMES DAILY. TAKE WITH OR IMMEDIATELY FOLLOWING A MEAL.   OXYCONTIN 10 MG 12 hr tablet Take 10 mg by mouth in the morning and at bedtime.   pantoprazole (PROTONIX) 40 MG tablet Take 40 mg by mouth daily.   SYRINGE-NEEDLE, DISP, 3 ML (MONOJECT 3CC SYR 23GX1") 23G X 1" 3 ML MISC Use 1 Syringe as directed   tamsulosin (FLOMAX) 0.4 MG CAPS capsule Take 1 capsule (0.4 mg total) by mouth daily. (Patient taking differently: Take 0.4 mg by mouth as needed.)   testosterone cypionate (DEPOTESTOSTERONE CYPIONATE) 200 MG/ML injection Inject 200 mg into the muscle every 7 (seven) days.   traZODone (DESYREL) 50 MG tablet Take 25 mg by mouth at bedtime as needed for sleep.    [DISCONTINUED] albuterol (VENTOLIN HFA) 108 (90 Base) MCG/ACT inhaler Inhale 2 puffs into the lungs every 4 (four) hours as needed.   [DISCONTINUED] Atogepant (QULIPTA) 60 MG TABS Take by mouth.   [DISCONTINUED] atorvastatin (LIPITOR) 40 MG tablet TAKE 1 TABLET BY MOUTH EVERY DAY   [DISCONTINUED] benzonatate (TESSALON) 100 MG capsule Take 1 capsule (100 mg total) by mouth every 8 (eight) hours.   [DISCONTINUED] Eluxadoline (VIBERZI) 75 MG TABS Take 1 tablet by mouth 2 (two) times daily.   [DISCONTINUED] nortriptyline (PAMELOR) 50 MG capsule Take 50 mg by mouth at bedtime.   [DISCONTINUED] promethazine-dextromethorphan (PROMETHAZINE-DM)  6.25-15 MG/5ML syrup Take 5 mLs by mouth 4 (four) times daily as needed.   [DISCONTINUED] SUMAtriptan (IMITREX) 100 MG tablet Take 100 mg by mouth every 2 (two) hours as needed for migraine.     Allergies:   Pravastatin and Celexa [citalopram]   Social History   Socioeconomic History   Marital status: Married    Spouse name: Not on file   Number of children: Not on file   Years of education: Not on file   Highest education level: Not on file  Occupational History   Not on file  Tobacco Use   Smoking status: Never   Smokeless tobacco: Never  Vaping Use   Vaping Use: Never used  Substance and Sexual Activity   Alcohol use: No   Drug use: No   Sexual activity: Yes    Birth control/protection: None  Other Topics Concern   Not on file  Social History Narrative   Not  on file   Social Determinants of Health   Financial Resource Strain: Not on file  Food Insecurity: Not on file  Transportation Needs: Not on file  Physical Activity: Not on file  Stress: Not on file  Social Connections: Not on file     Family History: The patient's family history is negative for Bladder Cancer, Kidney cancer, and Prostate cancer.  ROS:   Please see the history of present illness.    All other systems reviewed and are negative.  EKGs/Labs/Other Studies Reviewed:    The following studies were reviewed today: Cardiac Studies & Procedures     STRESS TESTS  MYOCARDIAL PERFUSION IMAGING 10/19/2021  Narrative   Findings are consistent with no prior ischemia. The study is low risk.   No ST deviation was noted.   LV perfusion is normal.   Left ventricular function is normal. Nuclear stress EF: 54 %. The left ventricular ejection fraction is mildly decreased (45-54%). End diastolic cavity size is normal. End systolic cavity size is normal.   Prior study not available for comparison.  Normal perfusion. LVEF 54% with normal wall motion. This is a low risk study. Good exercise effort. No  prior for comparison.   ECHOCARDIOGRAM  ECHOCARDIOGRAM COMPLETE 12/12/2020  Narrative ECHOCARDIOGRAM REPORT    Patient Name:   [name] Brown [facility]. Date of Exam: 12/12/2020 Medical Rec #:  [id number]            Height:       66.0 in Accession #:    [id number]           Weight:       173.0 lb Date of Birth:  July 02, 1971            BSA:          1.880 m Patient Age:    49 years             BP:           132/80 mmHg Patient Gender: M                    HR:           84 bpm. Exam Location:  [address]  Procedure: 2D Echo, 3D Echo, Cardiac Doppler and Color Doppler  Indications:    R06.00 SOB  History:        Patient has no prior history of Echocardiogram examinations. Non-obstructive CAD, Signs/Symptoms:Fatigue; Risk Factors:Hypertension and Diabetes.  Sonographer:    Clearence Ped RCS Referring Phys: [phone] JILL D MCDANIEL  IMPRESSIONS   1. Left ventricular ejection fraction, by estimation, is 60 to 65%. Left ventricular ejection fraction by 3D volume is 62 %. The left ventricle has normal function. The left ventricle has no regional wall motion abnormalities. Left ventricular diastolic parameters were normal. 2. Right ventricular systolic function is normal. The right ventricular size is normal. 3. The mitral valve is grossly normal. Mild mitral valve regurgitation. No evidence of mitral stenosis. 4. The aortic valve is tricuspid. Aortic valve regurgitation is not visualized. No aortic stenosis is present. 5. The inferior vena cava is normal in size with greater than 50% respiratory variability, suggesting right atrial pressure of 3 mmHg.  Conclusion(s)/Recommendation(s): Normal biventricular function without evidence of hemodynamically significant valvular heart disease.  FINDINGS Left Ventricle: Left ventricular ejection fraction, by estimation, is 60 to 65%. Left ventricular ejection fraction by 3D volume is 62 %. The left ventricle has normal function. The left ventricle  has no regional wall motion abnormalities. The left ventricular internal cavity size was normal in size. There is no left ventricular hypertrophy. Left ventricular diastolic parameters were normal.  Right Ventricle: The right ventricular size is normal. No increase in right ventricular wall thickness. Right ventricular systolic function is normal.  Left Atrium: Left atrial size was normal in size.  Right Atrium: Right atrial size was normal in size.  Pericardium: Trivial pericardial effusion is present.  Mitral Valve: The mitral valve is grossly normal. Mild mitral valve regurgitation. No evidence of mitral valve stenosis.  Tricuspid Valve: The tricuspid valve is grossly normal. Tricuspid valve regurgitation is not demonstrated. No evidence of tricuspid stenosis.  Aortic Valve: The aortic valve is tricuspid. Aortic valve regurgitation is not visualized. No aortic stenosis is present.  Pulmonic Valve: The pulmonic valve was grossly normal. Pulmonic valve regurgitation is not visualized. No evidence of pulmonic stenosis.  Aorta: The aortic root and ascending aorta are structurally normal, with no evidence of dilitation.  Venous: The right upper pulmonary vein is normal. The inferior vena cava is normal in size with greater than 50% respiratory variability, suggesting right atrial pressure of 3 mmHg.  IAS/Shunts: The atrial septum is grossly normal.   LEFT VENTRICLE PLAX 2D LVIDd:         5.40 cm         Diastology LVIDs:         3.50 cm         LV e' medial:    6.53 cm/s LV PW:         1.00 cm         LV E/e' medial:  11.6 LV IVS:        0.90 cm         LV e' lateral:   8.05 cm/s LVOT diam:     2.10 cm         LV E/e' lateral: 9.4 LV SV:         52 LV SV Index:   28 LVOT Area:     3.46 cm        3D Volume EF LV 3D EF:    Left ventricular ejection fraction by 3D volume is 62 %.  3D Volume EF: 3D EF:        62 % LV EDV:       151 ml LV ESV:       57 ml LV SV:        94  ml  RIGHT VENTRICLE RV Basal diam:  3.20 cm RV S prime:     13.50 cm/s TAPSE (M-mode): 2.1 cm  LEFT ATRIUM             Index       RIGHT ATRIUM           Index LA diam:        3.90 cm 2.07 cm/m  RA Area:     13.30 cm LA Vol (A2C):   43.2 ml 22.98 ml/m RA Volume:   30.10 ml  16.01 ml/m LA Vol (A4C):   41.7 ml 22.18 ml/m LA Biplane Vol: 44.4 ml 23.61 ml/m AORTIC VALVE LVOT Vmax:   82.20 cm/s LVOT Vmean:  54.500 cm/s LVOT VTI:    0.150 m  AORTA Ao Root diam: 3.20 cm Ao Asc diam:  3.00 cm  MITRAL VALVE MV Area (PHT):             SHUNTS MV Decel Time:  Systemic VTI:  0.15 m MV E velocity: 75.80 cm/s  Systemic Diam: 2.10 cm MV A velocity: 81.00 cm/s MV E/A ratio:  0.94  Lennie Odor MD Electronically signed by Lennie Odor MD Signature Date/Time: 12/12/2020/3:35:48 PM    Final    MONITORS  LONG TERM MONITOR (3-14 DAYS) 04/19/2020  Narrative 1. The basic rhythm is normal sinus with an average HR of 105 bpm 2. No atrial fibrillation or flutter 3. No high-grade heart block or pathologic pauses 4. There are rare PVC's and rare supraventricular beats without sustained arrhythmias   CT SCANS  CT CORONARY MORPH W/CTA COR W/SCORE 04/21/2019  Addendum 04/21/2019 10:33 AM ADDENDUM REPORT: 04/21/2019 10:31  CLINICAL DATA:  Chest pain  EXAM: Cardiac CTA  MEDICATIONS: Sub lingual nitro. 4 mg and lopressor 5mg   TECHNIQUE: The patient was scanned on a [employer] 192 scanner. Gantry rotation speed was 250 msecs. Collimation was. 6 mm . A 120 kV prospective scan was triggered in the ascending thoracic aorta at 140 HU's with full mA between 30-70% of the R-R interval . Average HR during the scan was 57 bpm. The 3D data set was interpreted on a dedicated work station using MPR, MIP and VRT modes. A total of 80 cc of contrast was used.  FINDINGS: Non-cardiac: See separate report from [facility]. No significant findings on limited lung and soft  tissue windows.  Calcium score: Calcium noted in proximal LAD and proximal and mid circumflex  Coronary Arteries: Right dominant with no anomalies  LM: Normal  LAD: 25-49% calcific plaque in proximal LAD  D1: Small vessel normal  D2: Large branching vessel 1-24% soft plaque proximally  Circumflex: 25-49% calcific plaque in proximal and mid vessel  OM1: 25-49% calcific plaque in proximal vessel  OM2: Normal  RCA: 1-24% soft plaque in proximal and mid vessel  PDA: Normal  PLA: Normal  IMPRESSION: 1. Calcium score 257 which is 97 th percentile for age and sex  2. CAD RADS 2 age advanced but non obstructive CAD involving proximal LAD, circumflex and OM1  3.  Normal aortic root 3.1 cm  Charlton Haws   Electronically Signed By: Charlton Haws M.D. On: 04/21/2019 10:31  Narrative EXAM: OVER-READ INTERPRETATION  CT CHEST  The following report is an over-read performed by radiologist Dr. Jeronimo Greaves of [facility] on 04/21/2019. This over-read does not include interpretation of cardiac or coronary anatomy or pathology. The coronary CTA interpretation by the cardiologist is attached.  COMPARISON:  Chest CT of 08/13/2018  FINDINGS: Vascular: Normal aortic caliber. No central pulmonary embolism, on this non-dedicated study.  Mediastinum/Nodes: No imaged thoracic adenopathy.  Lungs/Pleura: No pleural fluid.  Clear imaged lungs.  Upper Abdomen: Normal imaged portions of the liver, spleen, stomach.  Musculoskeletal: Dorsal spinal stimulator  IMPRESSION: No acute findings in the imaged extracardiac chest.  Electronically Signed: By: Jeronimo Greaves M.D. On: 04/21/2019 09:06          EKG:  EKG is ordered today.  The ekg ordered today demonstrates normal sinus rhythm 79 bpm, within normal limits.  Recent Labs: No results found for requested labs within last 365 days.  Recent Lipid Panel    Component Value Date/Time   CHOL 132 02/12/2020 0830    TRIG 59 02/12/2020 0830   HDL 47 02/12/2020 0830   CHOLHDL 2.8 02/12/2020 0830   VLDL 12 02/12/2020 0830   LDLCALC 73 02/12/2020 0830     Risk Assessment/Calculations:  Physical Exam:    VS:  BP (!) 130/90   Pulse 77   Ht 5\' 6"  (1.676 m)   Wt 180 lb 9.6 oz (81.9 kg)   SpO2 95%   BMI 29.15 kg/m     Wt Readings from Last 3 Encounters:  02/04/23 180 lb 9.6 oz (81.9 kg)  09/21/22 165 lb (74.8 kg)  01/02/22 171 lb (77.6 kg)     GEN:  Well nourished, well developed in no acute distress HEENT: Normal NECK: No JVD; No carotid bruits LYMPHATICS: No lymphadenopathy CARDIAC: RRR, no murmurs, rubs, gallops RESPIRATORY:  Clear to auscultation without rales, wheezing or rhonchi  ABDOMEN: Soft, non-tender, non-distended MUSCULOSKELETAL:  No edema; No deformity  SKIN: Warm and dry NEUROLOGIC:  Alert and oriented x 3 PSYCHIATRIC:  Normal affect   ASSESSMENT:    1. Mixed hyperlipidemia   2. Coronary artery disease involving native coronary artery of native heart without angina pectoris   3. Aortic atherosclerosis   4. Essential hypertension   5. Sinus tachycardia    PLAN:    In order of problems listed above:  Treated with atorvastatin 10 mg daily.  Cholesterol is 145, triglycerides 161, HDL 54, and LDL 59. LFT's are normal.  Patient with no anginal symptoms.  He has had nonobstructive CAD on gated coronary CT angiography.  Continue statin drug and beta-blocker. Treated with a statin drug. Blood pressure well-controlled on metoprolol. Resolved with treatment of iron deficiency.  Previous evaluation with event monitoring reviewed.  Medication Adjustments/Labs and Tests Ordered: Current medicines are reviewed at length with the patient today.  Concerns regarding medicines are outlined above.  Orders Placed This Encounter  Procedures   EKG 12-Lead   No orders of the defined types were placed in this encounter.   Patient Instructions  Medication Instructions:   Your physician recommends that you continue on your current medications as directed. Please refer to the Current Medication list given to you today.  *If you need a refill on your cardiac medications before your next appointment, please call your pharmacy*   Lab Work: NONE If you have labs (blood work) drawn today and your tests are completely normal, you will receive your results only by: MyChart Message (if you have MyChart) OR A paper copy in the mail If you have any lab test that is abnormal or we need to change your treatment, we will call you to review the results.   Testing/Procedures: NONE   Follow-Up: At [facility], you and your health needs are our priority.  As part of our continuing mission to provide you with exceptional heart care, we have created designated Provider Care Teams.  These Care Teams include your primary Cardiologist (physician) and Advanced Practice Providers (APPs -  Physician Assistants and Nurse Practitioners) who all work together to provide you with the care you need, when you need it.  We recommend signing up for the patient portal called "MyChart".  Sign up information is provided on this After Visit Summary.  MyChart is used to connect with patients for Virtual Visits (Telemedicine).  Patients are able to view lab/test results, encounter notes, upcoming appointments, etc.  Non-urgent messages can be sent to your provider as well.   To learn more about what you can do with MyChart, go to [email or web address].    Your next appointment:   1 year(s)  Provider:   Tonny Bollman, MD        Signed, Tonny Bollman, MD  02/04/2023 5:23 PM  [place]

## 2023-02-04 NOTE — Patient Instructions (Signed)
Medication Instructions:  Your physician recommends that you continue on your current medications as directed. Please refer to the Current Medication list given to you today.  *If you need a refill on your cardiac medications before your next appointment, please call your pharmacy*   Lab Work: NONE If you have labs (blood work) drawn today and your tests are completely normal, you will receive your results only by: MyChart Message (if you have MyChart) OR A paper copy in the mail If you have any lab test that is abnormal or we need to change your treatment, we will call you to review the results.   Testing/Procedures: NONE   Follow-Up: At [facility], you and your health needs are our priority.  As part of our continuing mission to provide you with exceptional heart care, we have created designated Provider Care Teams.  These Care Teams include your primary Cardiologist (physician) and Advanced Practice Providers (APPs -  Physician Assistants and Nurse Practitioners) who all work together to provide you with the care you need, when you need it.  We recommend signing up for the patient portal called "MyChart".  Sign up information is provided on this After Visit Summary.  MyChart is used to connect with patients for Virtual Visits (Telemedicine).  Patients are able to view lab/test results, encounter notes, upcoming appointments, etc.  Non-urgent messages can be sent to your provider as well.   To learn more about what you can do with MyChart, go to [email or web address].    Your next appointment:   1 year(s)  Provider:   Michael Cooper, MD      

## 2023-02-06 DIAGNOSIS — L728 Other follicular cysts of the skin and subcutaneous tissue: Secondary | ICD-10-CM | POA: Diagnosis not present

## 2023-02-12 ENCOUNTER — Other Ambulatory Visit: Payer: Self-pay | Admitting: Cardiovascular Disease

## 2023-02-15 DIAGNOSIS — H2513 Age-related nuclear cataract, bilateral: Secondary | ICD-10-CM | POA: Diagnosis not present

## 2023-02-15 DIAGNOSIS — E113293 Type 2 diabetes mellitus with mild nonproliferative diabetic retinopathy without macular edema, bilateral: Secondary | ICD-10-CM | POA: Diagnosis not present

## 2023-02-21 DIAGNOSIS — E611 Iron deficiency: Secondary | ICD-10-CM | POA: Diagnosis not present

## 2023-03-04 DIAGNOSIS — G43719 Chronic migraine without aura, intractable, without status migrainosus: Secondary | ICD-10-CM | POA: Diagnosis not present

## 2023-03-07 DIAGNOSIS — M5412 Radiculopathy, cervical region: Secondary | ICD-10-CM | POA: Diagnosis not present

## 2023-03-07 DIAGNOSIS — M47814 Spondylosis without myelopathy or radiculopathy, thoracic region: Secondary | ICD-10-CM | POA: Diagnosis not present

## 2023-03-07 DIAGNOSIS — F419 Anxiety disorder, unspecified: Secondary | ICD-10-CM | POA: Diagnosis not present

## 2023-03-07 DIAGNOSIS — G894 Chronic pain syndrome: Secondary | ICD-10-CM | POA: Diagnosis not present

## 2023-03-07 DIAGNOSIS — M533 Sacrococcygeal disorders, not elsewhere classified: Secondary | ICD-10-CM | POA: Diagnosis not present

## 2023-03-07 DIAGNOSIS — M791 Myalgia, unspecified site: Secondary | ICD-10-CM | POA: Diagnosis not present

## 2023-03-07 DIAGNOSIS — Z79899 Other long term (current) drug therapy: Secondary | ICD-10-CM | POA: Diagnosis not present

## 2023-03-20 DIAGNOSIS — J301 Allergic rhinitis due to pollen: Secondary | ICD-10-CM | POA: Diagnosis not present

## 2023-03-22 DIAGNOSIS — E611 Iron deficiency: Secondary | ICD-10-CM | POA: Diagnosis not present

## 2023-03-26 DIAGNOSIS — F419 Anxiety disorder, unspecified: Secondary | ICD-10-CM | POA: Diagnosis not present

## 2023-03-26 DIAGNOSIS — F322 Major depressive disorder, single episode, severe without psychotic features: Secondary | ICD-10-CM | POA: Diagnosis not present

## 2023-03-26 DIAGNOSIS — F5105 Insomnia due to other mental disorder: Secondary | ICD-10-CM | POA: Diagnosis not present

## 2023-04-02 DIAGNOSIS — J301 Allergic rhinitis due to pollen: Secondary | ICD-10-CM | POA: Diagnosis not present

## 2023-04-04 DIAGNOSIS — D751 Secondary polycythemia: Secondary | ICD-10-CM | POA: Diagnosis not present

## 2023-04-04 DIAGNOSIS — E611 Iron deficiency: Secondary | ICD-10-CM | POA: Diagnosis not present

## 2023-04-04 DIAGNOSIS — E538 Deficiency of other specified B group vitamins: Secondary | ICD-10-CM | POA: Diagnosis not present

## 2023-04-04 DIAGNOSIS — D500 Iron deficiency anemia secondary to blood loss (chronic): Secondary | ICD-10-CM | POA: Diagnosis not present

## 2023-04-04 DIAGNOSIS — Z79890 Hormone replacement therapy (postmenopausal): Secondary | ICD-10-CM | POA: Diagnosis not present

## 2023-04-05 DIAGNOSIS — L538 Other specified erythematous conditions: Secondary | ICD-10-CM | POA: Diagnosis not present

## 2023-04-05 DIAGNOSIS — L720 Epidermal cyst: Secondary | ICD-10-CM | POA: Diagnosis not present

## 2023-04-05 DIAGNOSIS — L570 Actinic keratosis: Secondary | ICD-10-CM | POA: Diagnosis not present

## 2023-04-05 DIAGNOSIS — L728 Other follicular cysts of the skin and subcutaneous tissue: Secondary | ICD-10-CM | POA: Diagnosis not present

## 2023-04-08 DIAGNOSIS — R718 Other abnormality of red blood cells: Secondary | ICD-10-CM | POA: Diagnosis not present

## 2023-04-08 DIAGNOSIS — D751 Secondary polycythemia: Secondary | ICD-10-CM | POA: Diagnosis not present

## 2023-04-24 DIAGNOSIS — D751 Secondary polycythemia: Secondary | ICD-10-CM | POA: Diagnosis not present

## 2023-05-01 DIAGNOSIS — D510 Vitamin B12 deficiency anemia due to intrinsic factor deficiency: Secondary | ICD-10-CM | POA: Diagnosis not present

## 2023-05-01 DIAGNOSIS — I608 Other nontraumatic subarachnoid hemorrhage: Secondary | ICD-10-CM | POA: Diagnosis not present

## 2023-05-01 DIAGNOSIS — N200 Calculus of kidney: Secondary | ICD-10-CM | POA: Diagnosis not present

## 2023-05-01 DIAGNOSIS — M7522 Bicipital tendinitis, left shoulder: Secondary | ICD-10-CM | POA: Diagnosis not present

## 2023-05-01 DIAGNOSIS — B001 Herpesviral vesicular dermatitis: Secondary | ICD-10-CM | POA: Diagnosis not present

## 2023-05-01 DIAGNOSIS — E611 Iron deficiency: Secondary | ICD-10-CM | POA: Diagnosis not present

## 2023-05-01 DIAGNOSIS — M75112 Incomplete rotator cuff tear or rupture of left shoulder, not specified as traumatic: Secondary | ICD-10-CM | POA: Diagnosis not present

## 2023-05-01 DIAGNOSIS — E538 Deficiency of other specified B group vitamins: Secondary | ICD-10-CM | POA: Diagnosis not present

## 2023-05-01 DIAGNOSIS — I609 Nontraumatic subarachnoid hemorrhage, unspecified: Secondary | ICD-10-CM | POA: Diagnosis not present

## 2023-05-06 DIAGNOSIS — E221 Hyperprolactinemia: Secondary | ICD-10-CM | POA: Diagnosis not present

## 2023-05-06 DIAGNOSIS — E1169 Type 2 diabetes mellitus with other specified complication: Secondary | ICD-10-CM | POA: Diagnosis not present

## 2023-05-06 DIAGNOSIS — E119 Type 2 diabetes mellitus without complications: Secondary | ICD-10-CM | POA: Diagnosis not present

## 2023-05-06 DIAGNOSIS — E785 Hyperlipidemia, unspecified: Secondary | ICD-10-CM | POA: Diagnosis not present

## 2023-05-06 DIAGNOSIS — E1159 Type 2 diabetes mellitus with other circulatory complications: Secondary | ICD-10-CM | POA: Diagnosis not present

## 2023-05-06 DIAGNOSIS — E291 Testicular hypofunction: Secondary | ICD-10-CM | POA: Diagnosis not present

## 2023-05-06 DIAGNOSIS — I152 Hypertension secondary to endocrine disorders: Secondary | ICD-10-CM | POA: Diagnosis not present

## 2023-05-07 DIAGNOSIS — R053 Chronic cough: Secondary | ICD-10-CM | POA: Diagnosis not present

## 2023-05-07 DIAGNOSIS — E221 Hyperprolactinemia: Secondary | ICD-10-CM | POA: Diagnosis not present

## 2023-05-07 DIAGNOSIS — R197 Diarrhea, unspecified: Secondary | ICD-10-CM | POA: Diagnosis not present

## 2023-05-07 DIAGNOSIS — K219 Gastro-esophageal reflux disease without esophagitis: Secondary | ICD-10-CM | POA: Diagnosis not present

## 2023-05-07 DIAGNOSIS — R4586 Emotional lability: Secondary | ICD-10-CM | POA: Diagnosis not present

## 2023-05-07 DIAGNOSIS — G8921 Chronic pain due to trauma: Secondary | ICD-10-CM | POA: Diagnosis not present

## 2023-05-07 DIAGNOSIS — E538 Deficiency of other specified B group vitamins: Secondary | ICD-10-CM | POA: Diagnosis not present

## 2023-05-07 DIAGNOSIS — E1169 Type 2 diabetes mellitus with other specified complication: Secondary | ICD-10-CM | POA: Diagnosis not present

## 2023-05-07 DIAGNOSIS — M2550 Pain in unspecified joint: Secondary | ICD-10-CM | POA: Diagnosis not present

## 2023-05-07 DIAGNOSIS — E785 Hyperlipidemia, unspecified: Secondary | ICD-10-CM | POA: Diagnosis not present

## 2023-05-07 DIAGNOSIS — G4701 Insomnia due to medical condition: Secondary | ICD-10-CM | POA: Diagnosis not present

## 2023-05-07 DIAGNOSIS — E291 Testicular hypofunction: Secondary | ICD-10-CM | POA: Diagnosis not present

## 2023-05-09 DIAGNOSIS — E221 Hyperprolactinemia: Secondary | ICD-10-CM | POA: Diagnosis not present

## 2023-05-09 DIAGNOSIS — I608 Other nontraumatic subarachnoid hemorrhage: Secondary | ICD-10-CM | POA: Diagnosis not present

## 2023-05-09 DIAGNOSIS — E1169 Type 2 diabetes mellitus with other specified complication: Secondary | ICD-10-CM | POA: Diagnosis not present

## 2023-05-09 DIAGNOSIS — Z96651 Presence of right artificial knee joint: Secondary | ICD-10-CM | POA: Diagnosis not present

## 2023-05-09 DIAGNOSIS — D510 Vitamin B12 deficiency anemia due to intrinsic factor deficiency: Secondary | ICD-10-CM | POA: Diagnosis not present

## 2023-05-09 DIAGNOSIS — E1159 Type 2 diabetes mellitus with other circulatory complications: Secondary | ICD-10-CM | POA: Diagnosis not present

## 2023-05-09 DIAGNOSIS — R200 Anesthesia of skin: Secondary | ICD-10-CM | POA: Diagnosis not present

## 2023-05-09 DIAGNOSIS — D509 Iron deficiency anemia, unspecified: Secondary | ICD-10-CM | POA: Diagnosis not present

## 2023-05-09 DIAGNOSIS — G5691 Unspecified mononeuropathy of right upper limb: Secondary | ICD-10-CM | POA: Diagnosis not present

## 2023-05-09 DIAGNOSIS — I609 Nontraumatic subarachnoid hemorrhage, unspecified: Secondary | ICD-10-CM | POA: Diagnosis not present

## 2023-05-09 DIAGNOSIS — D751 Secondary polycythemia: Secondary | ICD-10-CM | POA: Diagnosis not present

## 2023-05-09 DIAGNOSIS — Z471 Aftercare following joint replacement surgery: Secondary | ICD-10-CM | POA: Diagnosis not present

## 2023-05-09 DIAGNOSIS — F32A Depression, unspecified: Secondary | ICD-10-CM | POA: Diagnosis not present

## 2023-05-09 DIAGNOSIS — Z96659 Presence of unspecified artificial knee joint: Secondary | ICD-10-CM | POA: Diagnosis not present

## 2023-05-09 DIAGNOSIS — M21162 Varus deformity, not elsewhere classified, left knee: Secondary | ICD-10-CM | POA: Diagnosis not present

## 2023-05-21 DIAGNOSIS — G43719 Chronic migraine without aura, intractable, without status migrainosus: Secondary | ICD-10-CM | POA: Diagnosis not present

## 2023-06-05 DIAGNOSIS — L728 Other follicular cysts of the skin and subcutaneous tissue: Secondary | ICD-10-CM | POA: Diagnosis not present

## 2023-06-05 DIAGNOSIS — L570 Actinic keratosis: Secondary | ICD-10-CM | POA: Diagnosis not present

## 2023-06-11 DIAGNOSIS — R079 Chest pain, unspecified: Secondary | ICD-10-CM | POA: Diagnosis not present

## 2023-06-11 DIAGNOSIS — M5412 Radiculopathy, cervical region: Secondary | ICD-10-CM | POA: Diagnosis not present

## 2023-06-11 DIAGNOSIS — M791 Myalgia, unspecified site: Secondary | ICD-10-CM | POA: Diagnosis not present

## 2023-06-11 DIAGNOSIS — M533 Sacrococcygeal disorders, not elsewhere classified: Secondary | ICD-10-CM | POA: Diagnosis not present

## 2023-06-11 DIAGNOSIS — Z79899 Other long term (current) drug therapy: Secondary | ICD-10-CM | POA: Diagnosis not present

## 2023-06-11 DIAGNOSIS — M47814 Spondylosis without myelopathy or radiculopathy, thoracic region: Secondary | ICD-10-CM | POA: Diagnosis not present

## 2023-06-11 DIAGNOSIS — R053 Chronic cough: Secondary | ICD-10-CM | POA: Diagnosis not present

## 2023-06-11 DIAGNOSIS — F419 Anxiety disorder, unspecified: Secondary | ICD-10-CM | POA: Diagnosis not present

## 2023-06-11 DIAGNOSIS — R0602 Shortness of breath: Secondary | ICD-10-CM | POA: Diagnosis not present

## 2023-06-11 DIAGNOSIS — G894 Chronic pain syndrome: Secondary | ICD-10-CM | POA: Diagnosis not present

## 2023-06-14 ENCOUNTER — Other Ambulatory Visit: Payer: Self-pay | Admitting: Pulmonary Disease

## 2023-06-14 DIAGNOSIS — R079 Chest pain, unspecified: Secondary | ICD-10-CM

## 2023-06-14 DIAGNOSIS — R0602 Shortness of breath: Secondary | ICD-10-CM

## 2023-06-14 DIAGNOSIS — R053 Chronic cough: Secondary | ICD-10-CM

## 2023-06-17 DIAGNOSIS — R053 Chronic cough: Secondary | ICD-10-CM | POA: Diagnosis not present

## 2023-06-17 DIAGNOSIS — J301 Allergic rhinitis due to pollen: Secondary | ICD-10-CM | POA: Diagnosis not present

## 2023-06-20 ENCOUNTER — Ambulatory Visit
Admission: RE | Admit: 2023-06-20 | Discharge: 2023-06-20 | Disposition: A | Discharge status: Home / Self Care | Payer: Medicare Other | Source: Ambulatory Visit | Attending: Pulmonary Disease | Admitting: Pulmonary Disease

## 2023-06-20 DIAGNOSIS — D500 Iron deficiency anemia secondary to blood loss (chronic): Secondary | ICD-10-CM | POA: Diagnosis not present

## 2023-06-20 DIAGNOSIS — R079 Chest pain, unspecified: Secondary | ICD-10-CM | POA: Diagnosis not present

## 2023-06-20 DIAGNOSIS — R053 Chronic cough: Secondary | ICD-10-CM | POA: Insufficient documentation

## 2023-06-20 DIAGNOSIS — R0602 Shortness of breath: Secondary | ICD-10-CM | POA: Insufficient documentation

## 2023-06-20 DIAGNOSIS — Z96659 Presence of unspecified artificial knee joint: Secondary | ICD-10-CM | POA: Diagnosis not present

## 2023-06-20 DIAGNOSIS — I2510 Atherosclerotic heart disease of native coronary artery without angina pectoris: Secondary | ICD-10-CM | POA: Diagnosis not present

## 2023-06-20 DIAGNOSIS — R918 Other nonspecific abnormal finding of lung field: Secondary | ICD-10-CM | POA: Diagnosis not present

## 2023-06-20 LAB — POCT I-STAT CREATININE: Creatinine, Ser: 1.1 mg/dL (ref 0.61–1.24)

## 2023-06-20 MED ORDER — IOHEXOL 350 MG/ML SOLN
75.0000 mL | Freq: Once | INTRAVENOUS | Status: AC | PRN
  Administered 2023-06-20: 75 mL via INTRAVENOUS

## 2023-07-09 DIAGNOSIS — J4541 Moderate persistent asthma with (acute) exacerbation: Secondary | ICD-10-CM | POA: Diagnosis not present

## 2023-07-31 DIAGNOSIS — E785 Hyperlipidemia, unspecified: Secondary | ICD-10-CM | POA: Diagnosis not present

## 2023-07-31 DIAGNOSIS — E221 Hyperprolactinemia: Secondary | ICD-10-CM | POA: Diagnosis not present

## 2023-07-31 DIAGNOSIS — E1169 Type 2 diabetes mellitus with other specified complication: Secondary | ICD-10-CM | POA: Diagnosis not present

## 2023-07-31 DIAGNOSIS — I2510 Atherosclerotic heart disease of native coronary artery without angina pectoris: Secondary | ICD-10-CM | POA: Diagnosis not present

## 2023-07-31 DIAGNOSIS — J301 Allergic rhinitis due to pollen: Secondary | ICD-10-CM | POA: Diagnosis not present

## 2023-08-02 DIAGNOSIS — J301 Allergic rhinitis due to pollen: Secondary | ICD-10-CM | POA: Diagnosis not present

## 2023-08-07 DIAGNOSIS — L728 Other follicular cysts of the skin and subcutaneous tissue: Secondary | ICD-10-CM | POA: Diagnosis not present

## 2023-08-07 DIAGNOSIS — L720 Epidermal cyst: Secondary | ICD-10-CM | POA: Diagnosis not present

## 2023-08-07 DIAGNOSIS — L538 Other specified erythematous conditions: Secondary | ICD-10-CM | POA: Diagnosis not present

## 2023-08-07 DIAGNOSIS — R208 Other disturbances of skin sensation: Secondary | ICD-10-CM | POA: Diagnosis not present

## 2023-08-08 DIAGNOSIS — J4541 Moderate persistent asthma with (acute) exacerbation: Secondary | ICD-10-CM | POA: Diagnosis not present

## 2023-08-22 DIAGNOSIS — G43719 Chronic migraine without aura, intractable, without status migrainosus: Secondary | ICD-10-CM | POA: Diagnosis not present

## 2023-09-05 DIAGNOSIS — D500 Iron deficiency anemia secondary to blood loss (chronic): Secondary | ICD-10-CM | POA: Diagnosis not present

## 2023-09-05 DIAGNOSIS — D751 Secondary polycythemia: Secondary | ICD-10-CM | POA: Diagnosis not present

## 2023-09-09 DIAGNOSIS — D751 Secondary polycythemia: Secondary | ICD-10-CM | POA: Diagnosis not present

## 2023-09-10 DIAGNOSIS — M47814 Spondylosis without myelopathy or radiculopathy, thoracic region: Secondary | ICD-10-CM | POA: Diagnosis not present

## 2023-09-10 DIAGNOSIS — K5900 Constipation, unspecified: Secondary | ICD-10-CM | POA: Diagnosis not present

## 2023-09-10 DIAGNOSIS — G894 Chronic pain syndrome: Secondary | ICD-10-CM | POA: Diagnosis not present

## 2023-09-10 DIAGNOSIS — Z79899 Other long term (current) drug therapy: Secondary | ICD-10-CM | POA: Diagnosis not present

## 2023-09-10 DIAGNOSIS — M791 Myalgia, unspecified site: Secondary | ICD-10-CM | POA: Diagnosis not present

## 2023-09-10 DIAGNOSIS — M533 Sacrococcygeal disorders, not elsewhere classified: Secondary | ICD-10-CM | POA: Diagnosis not present

## 2023-09-10 DIAGNOSIS — M5412 Radiculopathy, cervical region: Secondary | ICD-10-CM | POA: Diagnosis not present

## 2023-09-10 DIAGNOSIS — F419 Anxiety disorder, unspecified: Secondary | ICD-10-CM | POA: Diagnosis not present

## 2023-09-10 DIAGNOSIS — M064 Inflammatory polyarthropathy: Secondary | ICD-10-CM | POA: Diagnosis not present

## 2023-10-10 DIAGNOSIS — E119 Type 2 diabetes mellitus without complications: Secondary | ICD-10-CM | POA: Diagnosis not present

## 2023-10-10 DIAGNOSIS — I2510 Atherosclerotic heart disease of native coronary artery without angina pectoris: Secondary | ICD-10-CM | POA: Diagnosis not present

## 2023-10-10 DIAGNOSIS — R0602 Shortness of breath: Secondary | ICD-10-CM | POA: Diagnosis not present

## 2023-10-10 DIAGNOSIS — E785 Hyperlipidemia, unspecified: Secondary | ICD-10-CM | POA: Diagnosis not present

## 2023-10-10 DIAGNOSIS — E1169 Type 2 diabetes mellitus with other specified complication: Secondary | ICD-10-CM | POA: Diagnosis not present

## 2023-10-21 DIAGNOSIS — D500 Iron deficiency anemia secondary to blood loss (chronic): Secondary | ICD-10-CM | POA: Diagnosis not present

## 2023-10-22 ENCOUNTER — Other Ambulatory Visit: Payer: Self-pay | Admitting: Medical Genetics

## 2023-10-22 DIAGNOSIS — L570 Actinic keratosis: Secondary | ICD-10-CM | POA: Diagnosis not present

## 2023-10-24 DIAGNOSIS — D751 Secondary polycythemia: Secondary | ICD-10-CM | POA: Diagnosis not present

## 2023-10-24 DIAGNOSIS — R718 Other abnormality of red blood cells: Secondary | ICD-10-CM | POA: Diagnosis not present

## 2023-10-31 DIAGNOSIS — I152 Hypertension secondary to endocrine disorders: Secondary | ICD-10-CM | POA: Diagnosis not present

## 2023-10-31 DIAGNOSIS — M791 Myalgia, unspecified site: Secondary | ICD-10-CM | POA: Diagnosis not present

## 2023-10-31 DIAGNOSIS — E1159 Type 2 diabetes mellitus with other circulatory complications: Secondary | ICD-10-CM | POA: Diagnosis not present

## 2023-11-11 DIAGNOSIS — M2550 Pain in unspecified joint: Secondary | ICD-10-CM | POA: Diagnosis not present

## 2023-11-11 DIAGNOSIS — E538 Deficiency of other specified B group vitamins: Secondary | ICD-10-CM | POA: Diagnosis not present

## 2023-11-11 DIAGNOSIS — R4586 Emotional lability: Secondary | ICD-10-CM | POA: Diagnosis not present

## 2023-11-11 DIAGNOSIS — E291 Testicular hypofunction: Secondary | ICD-10-CM | POA: Diagnosis not present

## 2023-11-11 DIAGNOSIS — E119 Type 2 diabetes mellitus without complications: Secondary | ICD-10-CM | POA: Diagnosis not present

## 2023-11-11 DIAGNOSIS — E1159 Type 2 diabetes mellitus with other circulatory complications: Secondary | ICD-10-CM | POA: Diagnosis not present

## 2023-11-11 DIAGNOSIS — E221 Hyperprolactinemia: Secondary | ICD-10-CM | POA: Diagnosis not present

## 2023-11-11 DIAGNOSIS — E1169 Type 2 diabetes mellitus with other specified complication: Secondary | ICD-10-CM | POA: Diagnosis not present

## 2023-11-11 DIAGNOSIS — K219 Gastro-esophageal reflux disease without esophagitis: Secondary | ICD-10-CM | POA: Diagnosis not present

## 2023-11-11 DIAGNOSIS — Z1331 Encounter for screening for depression: Secondary | ICD-10-CM | POA: Diagnosis not present

## 2023-11-11 DIAGNOSIS — E785 Hyperlipidemia, unspecified: Secondary | ICD-10-CM | POA: Diagnosis not present

## 2023-11-11 DIAGNOSIS — G8921 Chronic pain due to trauma: Secondary | ICD-10-CM | POA: Diagnosis not present

## 2023-11-11 DIAGNOSIS — I152 Hypertension secondary to endocrine disorders: Secondary | ICD-10-CM | POA: Diagnosis not present

## 2023-11-11 DIAGNOSIS — R053 Chronic cough: Secondary | ICD-10-CM | POA: Diagnosis not present

## 2023-11-11 DIAGNOSIS — G4701 Insomnia due to medical condition: Secondary | ICD-10-CM | POA: Diagnosis not present

## 2023-11-12 ENCOUNTER — Other Ambulatory Visit
Admission: RE | Admit: 2023-11-12 | Discharge: 2023-11-12 | Disposition: A | Discharge status: Home / Self Care | Payer: Self-pay | Source: Ambulatory Visit | Attending: Medical Genetics | Admitting: Medical Genetics

## 2023-11-12 DIAGNOSIS — M2550 Pain in unspecified joint: Secondary | ICD-10-CM | POA: Diagnosis not present

## 2023-11-19 DIAGNOSIS — G43719 Chronic migraine without aura, intractable, without status migrainosus: Secondary | ICD-10-CM | POA: Diagnosis not present

## 2023-11-22 LAB — GENECONNECT MOLECULAR SCREEN: Genetic Analysis Overall Interpretation: NEGATIVE

## 2023-11-27 DIAGNOSIS — L923 Foreign body granuloma of the skin and subcutaneous tissue: Secondary | ICD-10-CM | POA: Diagnosis not present

## 2023-12-02 DIAGNOSIS — D500 Iron deficiency anemia secondary to blood loss (chronic): Secondary | ICD-10-CM | POA: Diagnosis not present

## 2023-12-04 DIAGNOSIS — J301 Allergic rhinitis due to pollen: Secondary | ICD-10-CM | POA: Diagnosis not present

## 2023-12-05 DIAGNOSIS — R718 Other abnormality of red blood cells: Secondary | ICD-10-CM | POA: Diagnosis not present

## 2023-12-05 DIAGNOSIS — D751 Secondary polycythemia: Secondary | ICD-10-CM | POA: Diagnosis not present

## 2023-12-06 DIAGNOSIS — J301 Allergic rhinitis due to pollen: Secondary | ICD-10-CM | POA: Diagnosis not present

## 2023-12-10 DIAGNOSIS — Z79899 Other long term (current) drug therapy: Secondary | ICD-10-CM | POA: Diagnosis not present

## 2023-12-10 DIAGNOSIS — F419 Anxiety disorder, unspecified: Secondary | ICD-10-CM | POA: Diagnosis not present

## 2023-12-10 DIAGNOSIS — M791 Myalgia, unspecified site: Secondary | ICD-10-CM | POA: Diagnosis not present

## 2023-12-10 DIAGNOSIS — M064 Inflammatory polyarthropathy: Secondary | ICD-10-CM | POA: Diagnosis not present

## 2023-12-10 DIAGNOSIS — M47814 Spondylosis without myelopathy or radiculopathy, thoracic region: Secondary | ICD-10-CM | POA: Diagnosis not present

## 2023-12-10 DIAGNOSIS — G894 Chronic pain syndrome: Secondary | ICD-10-CM | POA: Diagnosis not present

## 2023-12-10 DIAGNOSIS — K5900 Constipation, unspecified: Secondary | ICD-10-CM | POA: Diagnosis not present

## 2023-12-10 DIAGNOSIS — M533 Sacrococcygeal disorders, not elsewhere classified: Secondary | ICD-10-CM | POA: Diagnosis not present

## 2023-12-10 DIAGNOSIS — M5412 Radiculopathy, cervical region: Secondary | ICD-10-CM | POA: Diagnosis not present

## 2023-12-26 DIAGNOSIS — D126 Benign neoplasm of colon, unspecified: Secondary | ICD-10-CM | POA: Diagnosis not present

## 2023-12-26 DIAGNOSIS — K2950 Unspecified chronic gastritis without bleeding: Secondary | ICD-10-CM | POA: Diagnosis not present

## 2023-12-26 DIAGNOSIS — E611 Iron deficiency: Secondary | ICD-10-CM | POA: Diagnosis not present

## 2023-12-26 DIAGNOSIS — K219 Gastro-esophageal reflux disease without esophagitis: Secondary | ICD-10-CM | POA: Diagnosis not present

## 2023-12-26 DIAGNOSIS — D751 Secondary polycythemia: Secondary | ICD-10-CM | POA: Diagnosis not present

## 2023-12-26 DIAGNOSIS — E530 Riboflavin deficiency: Secondary | ICD-10-CM | POA: Diagnosis not present

## 2023-12-26 DIAGNOSIS — R1013 Epigastric pain: Secondary | ICD-10-CM | POA: Diagnosis not present

## 2023-12-26 DIAGNOSIS — E538 Deficiency of other specified B group vitamins: Secondary | ICD-10-CM | POA: Diagnosis not present

## 2024-01-14 DIAGNOSIS — D500 Iron deficiency anemia secondary to blood loss (chronic): Secondary | ICD-10-CM | POA: Diagnosis not present

## 2024-01-14 DIAGNOSIS — D751 Secondary polycythemia: Secondary | ICD-10-CM | POA: Diagnosis not present

## 2024-01-23 DIAGNOSIS — E611 Iron deficiency: Secondary | ICD-10-CM | POA: Diagnosis not present

## 2024-02-20 DIAGNOSIS — R9431 Abnormal electrocardiogram [ECG] [EKG]: Secondary | ICD-10-CM | POA: Diagnosis not present

## 2024-02-20 DIAGNOSIS — R002 Palpitations: Secondary | ICD-10-CM | POA: Diagnosis not present

## 2024-02-24 DIAGNOSIS — D500 Iron deficiency anemia secondary to blood loss (chronic): Secondary | ICD-10-CM | POA: Diagnosis not present

## 2024-02-24 DIAGNOSIS — G43719 Chronic migraine without aura, intractable, without status migrainosus: Secondary | ICD-10-CM | POA: Diagnosis not present

## 2024-02-24 DIAGNOSIS — D751 Secondary polycythemia: Secondary | ICD-10-CM | POA: Diagnosis not present

## 2024-02-25 DIAGNOSIS — M064 Inflammatory polyarthropathy: Secondary | ICD-10-CM | POA: Diagnosis not present

## 2024-02-25 DIAGNOSIS — M791 Myalgia, unspecified site: Secondary | ICD-10-CM | POA: Diagnosis not present

## 2024-02-25 DIAGNOSIS — Z79899 Other long term (current) drug therapy: Secondary | ICD-10-CM | POA: Diagnosis not present

## 2024-02-25 DIAGNOSIS — M533 Sacrococcygeal disorders, not elsewhere classified: Secondary | ICD-10-CM | POA: Diagnosis not present

## 2024-02-25 DIAGNOSIS — K5900 Constipation, unspecified: Secondary | ICD-10-CM | POA: Diagnosis not present

## 2024-02-25 DIAGNOSIS — F419 Anxiety disorder, unspecified: Secondary | ICD-10-CM | POA: Diagnosis not present

## 2024-02-25 DIAGNOSIS — M5412 Radiculopathy, cervical region: Secondary | ICD-10-CM | POA: Diagnosis not present

## 2024-02-25 DIAGNOSIS — M47814 Spondylosis without myelopathy or radiculopathy, thoracic region: Secondary | ICD-10-CM | POA: Diagnosis not present

## 2024-02-25 DIAGNOSIS — G894 Chronic pain syndrome: Secondary | ICD-10-CM | POA: Diagnosis not present

## 2024-02-27 DIAGNOSIS — R718 Other abnormality of red blood cells: Secondary | ICD-10-CM | POA: Diagnosis not present

## 2024-02-27 DIAGNOSIS — D751 Secondary polycythemia: Secondary | ICD-10-CM | POA: Diagnosis not present

## 2024-03-05 DIAGNOSIS — R0602 Shortness of breath: Secondary | ICD-10-CM | POA: Diagnosis not present

## 2024-03-05 DIAGNOSIS — R051 Acute cough: Secondary | ICD-10-CM | POA: Diagnosis not present

## 2024-03-05 DIAGNOSIS — R002 Palpitations: Secondary | ICD-10-CM | POA: Diagnosis not present

## 2024-03-05 DIAGNOSIS — J45909 Unspecified asthma, uncomplicated: Secondary | ICD-10-CM | POA: Diagnosis not present

## 2024-03-10 DIAGNOSIS — J45909 Unspecified asthma, uncomplicated: Secondary | ICD-10-CM | POA: Diagnosis not present

## 2024-03-13 DIAGNOSIS — R053 Chronic cough: Secondary | ICD-10-CM | POA: Diagnosis not present

## 2024-03-17 DIAGNOSIS — J019 Acute sinusitis, unspecified: Secondary | ICD-10-CM | POA: Diagnosis not present

## 2024-03-17 DIAGNOSIS — R053 Chronic cough: Secondary | ICD-10-CM | POA: Diagnosis not present

## 2024-03-17 DIAGNOSIS — B9689 Other specified bacterial agents as the cause of diseases classified elsewhere: Secondary | ICD-10-CM | POA: Diagnosis not present

## 2024-03-19 DIAGNOSIS — G8929 Other chronic pain: Secondary | ICD-10-CM | POA: Diagnosis not present

## 2024-03-19 DIAGNOSIS — M545 Low back pain, unspecified: Secondary | ICD-10-CM | POA: Diagnosis not present

## 2024-04-01 DIAGNOSIS — J301 Allergic rhinitis due to pollen: Secondary | ICD-10-CM | POA: Diagnosis not present

## 2024-04-03 DIAGNOSIS — J301 Allergic rhinitis due to pollen: Secondary | ICD-10-CM | POA: Diagnosis not present

## 2024-04-07 DIAGNOSIS — D751 Secondary polycythemia: Secondary | ICD-10-CM | POA: Diagnosis not present

## 2024-04-07 DIAGNOSIS — D500 Iron deficiency anemia secondary to blood loss (chronic): Secondary | ICD-10-CM | POA: Diagnosis not present

## 2024-04-10 DIAGNOSIS — J45909 Unspecified asthma, uncomplicated: Secondary | ICD-10-CM | POA: Diagnosis not present

## 2024-04-10 DIAGNOSIS — R718 Other abnormality of red blood cells: Secondary | ICD-10-CM | POA: Diagnosis not present

## 2024-04-10 DIAGNOSIS — D751 Secondary polycythemia: Secondary | ICD-10-CM | POA: Diagnosis not present

## 2024-04-23 DIAGNOSIS — H2513 Age-related nuclear cataract, bilateral: Secondary | ICD-10-CM | POA: Diagnosis not present

## 2024-04-23 DIAGNOSIS — E119 Type 2 diabetes mellitus without complications: Secondary | ICD-10-CM | POA: Diagnosis not present

## 2024-05-18 DIAGNOSIS — D751 Secondary polycythemia: Secondary | ICD-10-CM | POA: Diagnosis not present

## 2024-05-18 DIAGNOSIS — I152 Hypertension secondary to endocrine disorders: Secondary | ICD-10-CM | POA: Diagnosis not present

## 2024-05-18 DIAGNOSIS — R718 Other abnormality of red blood cells: Secondary | ICD-10-CM | POA: Diagnosis not present

## 2024-05-18 DIAGNOSIS — E221 Hyperprolactinemia: Secondary | ICD-10-CM | POA: Diagnosis not present

## 2024-05-18 DIAGNOSIS — E1159 Type 2 diabetes mellitus with other circulatory complications: Secondary | ICD-10-CM | POA: Diagnosis not present

## 2024-05-18 DIAGNOSIS — E291 Testicular hypofunction: Secondary | ICD-10-CM | POA: Diagnosis not present

## 2024-05-18 DIAGNOSIS — Z79890 Hormone replacement therapy (postmenopausal): Secondary | ICD-10-CM | POA: Diagnosis not present

## 2024-05-18 DIAGNOSIS — E785 Hyperlipidemia, unspecified: Secondary | ICD-10-CM | POA: Diagnosis not present

## 2024-05-18 DIAGNOSIS — E1169 Type 2 diabetes mellitus with other specified complication: Secondary | ICD-10-CM | POA: Diagnosis not present

## 2024-05-19 DIAGNOSIS — E291 Testicular hypofunction: Secondary | ICD-10-CM | POA: Diagnosis not present

## 2024-05-19 DIAGNOSIS — E221 Hyperprolactinemia: Secondary | ICD-10-CM | POA: Diagnosis not present

## 2024-05-19 DIAGNOSIS — R4586 Emotional lability: Secondary | ICD-10-CM | POA: Diagnosis not present

## 2024-05-19 DIAGNOSIS — R053 Chronic cough: Secondary | ICD-10-CM | POA: Diagnosis not present

## 2024-05-19 DIAGNOSIS — G4701 Insomnia due to medical condition: Secondary | ICD-10-CM | POA: Diagnosis not present

## 2024-05-19 DIAGNOSIS — Z77120 Contact with and (suspected) exposure to mold (toxic): Secondary | ICD-10-CM | POA: Diagnosis not present

## 2024-05-19 DIAGNOSIS — K219 Gastro-esophageal reflux disease without esophagitis: Secondary | ICD-10-CM | POA: Diagnosis not present

## 2024-05-19 DIAGNOSIS — E1169 Type 2 diabetes mellitus with other specified complication: Secondary | ICD-10-CM | POA: Diagnosis not present

## 2024-05-19 DIAGNOSIS — G8921 Chronic pain due to trauma: Secondary | ICD-10-CM | POA: Diagnosis not present

## 2024-05-19 DIAGNOSIS — E538 Deficiency of other specified B group vitamins: Secondary | ICD-10-CM | POA: Diagnosis not present

## 2024-05-19 DIAGNOSIS — Z1331 Encounter for screening for depression: Secondary | ICD-10-CM | POA: Diagnosis not present

## 2024-05-19 DIAGNOSIS — M2550 Pain in unspecified joint: Secondary | ICD-10-CM | POA: Diagnosis not present

## 2024-05-26 DIAGNOSIS — G43719 Chronic migraine without aura, intractable, without status migrainosus: Secondary | ICD-10-CM | POA: Diagnosis not present

## 2024-06-01 DIAGNOSIS — D2261 Melanocytic nevi of right upper limb, including shoulder: Secondary | ICD-10-CM | POA: Diagnosis not present

## 2024-06-01 DIAGNOSIS — D2272 Melanocytic nevi of left lower limb, including hip: Secondary | ICD-10-CM | POA: Diagnosis not present

## 2024-06-01 DIAGNOSIS — L570 Actinic keratosis: Secondary | ICD-10-CM | POA: Diagnosis not present

## 2024-06-01 DIAGNOSIS — L821 Other seborrheic keratosis: Secondary | ICD-10-CM | POA: Diagnosis not present

## 2024-06-01 DIAGNOSIS — D225 Melanocytic nevi of trunk: Secondary | ICD-10-CM | POA: Diagnosis not present

## 2024-06-01 DIAGNOSIS — D2262 Melanocytic nevi of left upper limb, including shoulder: Secondary | ICD-10-CM | POA: Diagnosis not present

## 2024-06-01 DIAGNOSIS — D2271 Melanocytic nevi of right lower limb, including hip: Secondary | ICD-10-CM | POA: Diagnosis not present

## 2024-06-01 DIAGNOSIS — D485 Neoplasm of uncertain behavior of skin: Secondary | ICD-10-CM | POA: Diagnosis not present

## 2024-06-09 DIAGNOSIS — E538 Deficiency of other specified B group vitamins: Secondary | ICD-10-CM | POA: Diagnosis not present

## 2024-06-09 DIAGNOSIS — G43719 Chronic migraine without aura, intractable, without status migrainosus: Secondary | ICD-10-CM | POA: Diagnosis not present

## 2024-06-09 DIAGNOSIS — D751 Secondary polycythemia: Secondary | ICD-10-CM | POA: Diagnosis not present

## 2024-06-09 DIAGNOSIS — Z79899 Other long term (current) drug therapy: Secondary | ICD-10-CM | POA: Diagnosis not present

## 2024-06-09 DIAGNOSIS — D500 Iron deficiency anemia secondary to blood loss (chronic): Secondary | ICD-10-CM | POA: Diagnosis not present

## 2024-06-10 DIAGNOSIS — J45909 Unspecified asthma, uncomplicated: Secondary | ICD-10-CM | POA: Diagnosis not present

## 2024-06-17 DIAGNOSIS — J301 Allergic rhinitis due to pollen: Secondary | ICD-10-CM | POA: Diagnosis not present

## 2024-06-17 DIAGNOSIS — M542 Cervicalgia: Secondary | ICD-10-CM | POA: Diagnosis not present

## 2024-06-30 DIAGNOSIS — M5412 Radiculopathy, cervical region: Secondary | ICD-10-CM | POA: Diagnosis not present

## 2024-06-30 DIAGNOSIS — M064 Inflammatory polyarthropathy: Secondary | ICD-10-CM | POA: Diagnosis not present

## 2024-06-30 DIAGNOSIS — R718 Other abnormality of red blood cells: Secondary | ICD-10-CM | POA: Diagnosis not present

## 2024-06-30 DIAGNOSIS — Z79899 Other long term (current) drug therapy: Secondary | ICD-10-CM | POA: Diagnosis not present

## 2024-06-30 DIAGNOSIS — M47814 Spondylosis without myelopathy or radiculopathy, thoracic region: Secondary | ICD-10-CM | POA: Diagnosis not present

## 2024-06-30 DIAGNOSIS — M791 Myalgia, unspecified site: Secondary | ICD-10-CM | POA: Diagnosis not present

## 2024-06-30 DIAGNOSIS — M533 Sacrococcygeal disorders, not elsewhere classified: Secondary | ICD-10-CM | POA: Diagnosis not present

## 2024-06-30 DIAGNOSIS — F419 Anxiety disorder, unspecified: Secondary | ICD-10-CM | POA: Diagnosis not present

## 2024-06-30 DIAGNOSIS — K5900 Constipation, unspecified: Secondary | ICD-10-CM | POA: Diagnosis not present

## 2024-06-30 DIAGNOSIS — D751 Secondary polycythemia: Secondary | ICD-10-CM | POA: Diagnosis not present

## 2024-07-03 DIAGNOSIS — D751 Secondary polycythemia: Secondary | ICD-10-CM | POA: Diagnosis not present

## 2024-07-03 DIAGNOSIS — R718 Other abnormality of red blood cells: Secondary | ICD-10-CM | POA: Diagnosis not present

## 2024-07-08 DIAGNOSIS — D751 Secondary polycythemia: Secondary | ICD-10-CM | POA: Diagnosis not present

## 2024-07-09 ENCOUNTER — Telehealth: Payer: Self-pay | Admitting: Cardiovascular Disease

## 2024-07-09 NOTE — Telephone Encounter (Signed)
 Called pt to schedule f/u appt, he said he is seeing cards. At [facility]

## 2024-07-22 DIAGNOSIS — M1711 Unilateral primary osteoarthritis, right knee: Secondary | ICD-10-CM | POA: Diagnosis not present

## 2024-07-22 DIAGNOSIS — D509 Iron deficiency anemia, unspecified: Secondary | ICD-10-CM | POA: Diagnosis not present

## 2024-07-22 DIAGNOSIS — N200 Calculus of kidney: Secondary | ICD-10-CM | POA: Diagnosis not present

## 2024-07-22 DIAGNOSIS — E785 Hyperlipidemia, unspecified: Secondary | ICD-10-CM | POA: Diagnosis not present

## 2024-07-22 DIAGNOSIS — I609 Nontraumatic subarachnoid hemorrhage, unspecified: Secondary | ICD-10-CM | POA: Diagnosis not present

## 2024-07-22 DIAGNOSIS — M7582 Other shoulder lesions, left shoulder: Secondary | ICD-10-CM | POA: Diagnosis not present

## 2024-07-22 DIAGNOSIS — E1159 Type 2 diabetes mellitus with other circulatory complications: Secondary | ICD-10-CM | POA: Diagnosis not present

## 2024-07-22 DIAGNOSIS — E611 Iron deficiency: Secondary | ICD-10-CM | POA: Diagnosis not present

## 2024-07-22 DIAGNOSIS — I152 Hypertension secondary to endocrine disorders: Secondary | ICD-10-CM | POA: Diagnosis not present

## 2024-07-22 DIAGNOSIS — Z96651 Presence of right artificial knee joint: Secondary | ICD-10-CM | POA: Diagnosis not present

## 2024-07-22 DIAGNOSIS — M19012 Primary osteoarthritis, left shoulder: Secondary | ICD-10-CM | POA: Diagnosis not present

## 2024-07-22 DIAGNOSIS — D751 Secondary polycythemia: Secondary | ICD-10-CM | POA: Diagnosis not present

## 2024-07-22 DIAGNOSIS — E1169 Type 2 diabetes mellitus with other specified complication: Secondary | ICD-10-CM | POA: Diagnosis not present

## 2024-08-10 DIAGNOSIS — D751 Secondary polycythemia: Secondary | ICD-10-CM | POA: Diagnosis not present

## 2024-08-10 DIAGNOSIS — R718 Other abnormality of red blood cells: Secondary | ICD-10-CM | POA: Diagnosis not present

## 2024-08-10 DIAGNOSIS — J45909 Unspecified asthma, uncomplicated: Secondary | ICD-10-CM | POA: Diagnosis not present

## 2024-08-11 DIAGNOSIS — J301 Allergic rhinitis due to pollen: Secondary | ICD-10-CM | POA: Diagnosis not present

## 2024-08-13 DIAGNOSIS — K2950 Unspecified chronic gastritis without bleeding: Secondary | ICD-10-CM | POA: Diagnosis not present

## 2024-08-13 DIAGNOSIS — K3189 Other diseases of stomach and duodenum: Secondary | ICD-10-CM | POA: Diagnosis not present

## 2024-08-13 DIAGNOSIS — R1013 Epigastric pain: Secondary | ICD-10-CM | POA: Diagnosis not present

## 2024-08-13 DIAGNOSIS — Z7951 Long term (current) use of inhaled steroids: Secondary | ICD-10-CM | POA: Diagnosis not present

## 2024-08-13 DIAGNOSIS — Z09 Encounter for follow-up examination after completed treatment for conditions other than malignant neoplasm: Secondary | ICD-10-CM | POA: Diagnosis not present

## 2024-08-13 DIAGNOSIS — E611 Iron deficiency: Secondary | ICD-10-CM | POA: Diagnosis not present

## 2024-08-13 DIAGNOSIS — I10 Essential (primary) hypertension: Secondary | ICD-10-CM | POA: Diagnosis not present

## 2024-08-13 DIAGNOSIS — E785 Hyperlipidemia, unspecified: Secondary | ICD-10-CM | POA: Diagnosis not present

## 2024-08-13 DIAGNOSIS — K219 Gastro-esophageal reflux disease without esophagitis: Secondary | ICD-10-CM | POA: Diagnosis not present

## 2024-08-13 DIAGNOSIS — K640 First degree hemorrhoids: Secondary | ICD-10-CM | POA: Diagnosis not present

## 2024-08-13 DIAGNOSIS — J45909 Unspecified asthma, uncomplicated: Secondary | ICD-10-CM | POA: Diagnosis not present

## 2024-08-13 DIAGNOSIS — D519 Vitamin B12 deficiency anemia, unspecified: Secondary | ICD-10-CM | POA: Diagnosis not present

## 2024-08-13 DIAGNOSIS — K2940 Chronic atrophic gastritis without bleeding: Secondary | ICD-10-CM | POA: Diagnosis not present

## 2024-08-13 DIAGNOSIS — D126 Benign neoplasm of colon, unspecified: Secondary | ICD-10-CM | POA: Diagnosis not present

## 2024-08-13 DIAGNOSIS — Z79899 Other long term (current) drug therapy: Secondary | ICD-10-CM | POA: Diagnosis not present

## 2024-08-13 DIAGNOSIS — E1142 Type 2 diabetes mellitus with diabetic polyneuropathy: Secondary | ICD-10-CM | POA: Diagnosis not present

## 2024-08-13 DIAGNOSIS — D509 Iron deficiency anemia, unspecified: Secondary | ICD-10-CM | POA: Diagnosis not present

## 2024-08-13 DIAGNOSIS — Z860101 Personal history of adenomatous and serrated colon polyps: Secondary | ICD-10-CM | POA: Diagnosis not present

## 2024-08-14 DIAGNOSIS — J301 Allergic rhinitis due to pollen: Secondary | ICD-10-CM | POA: Diagnosis not present

## 2024-08-18 DIAGNOSIS — G43719 Chronic migraine without aura, intractable, without status migrainosus: Secondary | ICD-10-CM | POA: Diagnosis not present

## 2024-09-07 DIAGNOSIS — G43719 Chronic migraine without aura, intractable, without status migrainosus: Secondary | ICD-10-CM | POA: Diagnosis not present

## 2024-09-10 DIAGNOSIS — J45909 Unspecified asthma, uncomplicated: Secondary | ICD-10-CM | POA: Diagnosis not present

## 2024-09-22 DIAGNOSIS — R718 Other abnormality of red blood cells: Secondary | ICD-10-CM | POA: Diagnosis not present

## 2024-09-22 DIAGNOSIS — D751 Secondary polycythemia: Secondary | ICD-10-CM | POA: Diagnosis not present

## 2024-09-29 DIAGNOSIS — F419 Anxiety disorder, unspecified: Secondary | ICD-10-CM | POA: Diagnosis not present

## 2024-09-29 DIAGNOSIS — K5900 Constipation, unspecified: Secondary | ICD-10-CM | POA: Diagnosis not present

## 2024-09-29 DIAGNOSIS — M47814 Spondylosis without myelopathy or radiculopathy, thoracic region: Secondary | ICD-10-CM | POA: Diagnosis not present

## 2024-09-29 DIAGNOSIS — M791 Myalgia, unspecified site: Secondary | ICD-10-CM | POA: Diagnosis not present

## 2024-09-29 DIAGNOSIS — G894 Chronic pain syndrome: Secondary | ICD-10-CM | POA: Diagnosis not present

## 2024-09-29 DIAGNOSIS — M533 Sacrococcygeal disorders, not elsewhere classified: Secondary | ICD-10-CM | POA: Diagnosis not present

## 2024-09-29 DIAGNOSIS — Z79899 Other long term (current) drug therapy: Secondary | ICD-10-CM | POA: Diagnosis not present

## 2024-09-29 DIAGNOSIS — M064 Inflammatory polyarthropathy: Secondary | ICD-10-CM | POA: Diagnosis not present

## 2024-09-29 DIAGNOSIS — M5412 Radiculopathy, cervical region: Secondary | ICD-10-CM | POA: Diagnosis not present

## 2024-10-01 DIAGNOSIS — R0683 Snoring: Secondary | ICD-10-CM | POA: Diagnosis not present
